# Patient Record
Sex: Female | Born: 1945
Health system: Southern US, Community
[De-identification: ages and names within clinical notes are randomized; demographics above are authoritative.]

## PROBLEM LIST (undated history)

## (undated) DIAGNOSIS — L309 Dermatitis, unspecified: Secondary | ICD-10-CM

## (undated) DIAGNOSIS — M1990 Unspecified osteoarthritis, unspecified site: Secondary | ICD-10-CM

## (undated) DIAGNOSIS — F29 Unspecified psychosis not due to a substance or known physiological condition: Secondary | ICD-10-CM

## (undated) DIAGNOSIS — E785 Hyperlipidemia, unspecified: Secondary | ICD-10-CM

## (undated) DIAGNOSIS — M349 Systemic sclerosis, unspecified: Secondary | ICD-10-CM

## (undated) DIAGNOSIS — B029 Zoster without complications: Secondary | ICD-10-CM

## (undated) DIAGNOSIS — I10 Essential (primary) hypertension: Secondary | ICD-10-CM

## (undated) HISTORY — DX: Essential (primary) hypertension: I10

## (undated) HISTORY — DX: Systemic sclerosis, unspecified: M34.9

## (undated) HISTORY — DX: Unspecified psychosis not due to a substance or known physiological condition: F29

## (undated) HISTORY — DX: Dermatitis, unspecified: L30.9

## (undated) HISTORY — DX: Hyperlipidemia, unspecified: E78.5

---

## 1992-08-20 HISTORY — PX: TOTAL ABDOMINAL HYSTERECTOMY W/ BILATERAL SALPINGOOPHORECTOMY: SHX83

## 2000-06-26 HISTORY — PX: COLONOSCOPY: SHX174

## 2001-07-15 ENCOUNTER — Other Ambulatory Visit: Admission: RE | Admit: 2001-07-15 | Discharge: 2001-07-15 | Payer: Self-pay | Admitting: Family Medicine

## 2002-06-26 ENCOUNTER — Encounter: Payer: Self-pay | Admitting: Family Medicine

## 2002-06-26 ENCOUNTER — Ambulatory Visit (HOSPITAL_COMMUNITY): Admission: RE | Admit: 2002-06-26 | Discharge: 2002-06-26 | Payer: Self-pay | Admitting: Family Medicine

## 2002-11-27 ENCOUNTER — Emergency Department (HOSPITAL_COMMUNITY): Admission: EM | Admit: 2002-11-27 | Discharge: 2002-11-27 | Payer: Self-pay | Admitting: Emergency Medicine

## 2003-01-26 ENCOUNTER — Encounter: Payer: Self-pay | Admitting: Family Medicine

## 2003-01-26 ENCOUNTER — Ambulatory Visit (HOSPITAL_COMMUNITY): Admission: RE | Admit: 2003-01-26 | Discharge: 2003-01-26 | Payer: Self-pay | Admitting: Family Medicine

## 2004-05-09 ENCOUNTER — Ambulatory Visit (HOSPITAL_COMMUNITY): Admission: RE | Admit: 2004-05-09 | Discharge: 2004-05-09 | Payer: Self-pay | Admitting: Family Medicine

## 2004-07-25 ENCOUNTER — Ambulatory Visit: Payer: Self-pay | Admitting: Family Medicine

## 2004-08-02 ENCOUNTER — Ambulatory Visit: Payer: Self-pay | Admitting: Family Medicine

## 2004-08-22 ENCOUNTER — Ambulatory Visit: Payer: Self-pay | Admitting: Family Medicine

## 2005-01-09 ENCOUNTER — Ambulatory Visit: Payer: Self-pay | Admitting: Family Medicine

## 2005-05-08 ENCOUNTER — Ambulatory Visit: Payer: Self-pay | Admitting: Family Medicine

## 2005-07-10 ENCOUNTER — Ambulatory Visit: Payer: Self-pay | Admitting: Family Medicine

## 2005-07-24 ENCOUNTER — Ambulatory Visit (HOSPITAL_COMMUNITY): Admission: RE | Admit: 2005-07-24 | Discharge: 2005-07-24 | Payer: Self-pay | Admitting: Family Medicine

## 2005-09-11 ENCOUNTER — Ambulatory Visit: Payer: Self-pay | Admitting: Family Medicine

## 2005-09-25 ENCOUNTER — Ambulatory Visit (HOSPITAL_COMMUNITY): Admission: RE | Admit: 2005-09-25 | Discharge: 2005-09-25 | Payer: Self-pay | Admitting: Family Medicine

## 2005-11-06 ENCOUNTER — Ambulatory Visit: Payer: Self-pay | Admitting: Family Medicine

## 2006-01-22 ENCOUNTER — Ambulatory Visit: Payer: Self-pay | Admitting: Family Medicine

## 2006-01-25 ENCOUNTER — Ambulatory Visit (HOSPITAL_COMMUNITY): Admission: RE | Admit: 2006-01-25 | Discharge: 2006-01-25 | Payer: Self-pay | Admitting: Family Medicine

## 2006-02-12 ENCOUNTER — Encounter: Payer: Self-pay | Admitting: Family Medicine

## 2006-02-12 ENCOUNTER — Ambulatory Visit: Payer: Self-pay | Admitting: Family Medicine

## 2006-02-12 ENCOUNTER — Other Ambulatory Visit: Admission: RE | Admit: 2006-02-12 | Discharge: 2006-02-12 | Payer: Self-pay | Admitting: Family Medicine

## 2006-03-15 ENCOUNTER — Ambulatory Visit: Payer: Self-pay | Admitting: Family Medicine

## 2006-06-04 ENCOUNTER — Ambulatory Visit: Payer: Self-pay | Admitting: Family Medicine

## 2006-09-03 ENCOUNTER — Ambulatory Visit: Payer: Self-pay | Admitting: Family Medicine

## 2006-09-04 ENCOUNTER — Encounter: Payer: Self-pay | Admitting: Family Medicine

## 2006-09-04 LAB — CONVERTED CEMR LAB
Bilirubin Urine: NEGATIVE
Hemoglobin, Urine: NEGATIVE
Ketones, ur: NEGATIVE mg/dL
Nitrite: NEGATIVE
Protein, ur: NEGATIVE mg/dL
Specific Gravity, Urine: 1.01 (ref 1.005–1.03)
Urobilinogen, UA: 0.2 (ref 0.0–1.0)

## 2006-09-24 ENCOUNTER — Encounter: Payer: Self-pay | Admitting: Family Medicine

## 2006-09-24 LAB — CONVERTED CEMR LAB
Albumin: 4.3 g/dL (ref 3.5–5.2)
Alkaline Phosphatase: 60 units/L (ref 39–117)
Basophils Absolute: 0 10*3/uL (ref 0.0–0.1)
Eosinophils Absolute: 0 10*3/uL (ref 0.0–0.7)
HCT: 39 % (ref 36.0–46.0)
HDL: 54 mg/dL (ref >39)
Lymphocytes Relative: 27 % (ref 12–46)
MCHC: 32.8 g/dL (ref 30.0–36.0)
MCV: 90.1 fL (ref 78.0–100.0)
Monocytes Absolute: 0.8 10*3/uL — ABNORMAL HIGH (ref 0.2–0.7)
Monocytes Relative: 11 % (ref 3–11)
Neutrophils Relative %: 63 % (ref 43–77)
Total CHOL/HDL Ratio: 4.1
Total Protein: 8.5 g/dL — ABNORMAL HIGH (ref 6.0–8.3)
Triglycerides: 109 mg/dL (ref <150)
VLDL: 22 mg/dL (ref 0–40)
WBC: 7.5 10*3/uL (ref 4.0–10.5)

## 2006-10-29 ENCOUNTER — Ambulatory Visit: Payer: Self-pay | Admitting: Family Medicine

## 2006-12-24 ENCOUNTER — Ambulatory Visit: Payer: Self-pay | Admitting: Family Medicine

## 2006-12-24 LAB — CONVERTED CEMR LAB
Albumin: 4.6 g/dL (ref 3.5–5.2)
Alkaline Phosphatase: 58 units/L (ref 39–117)
BUN: 14 mg/dL (ref 6–23)
Bilirubin, Direct: 0.1 mg/dL (ref 0.0–0.3)
Chloride: 104 meq/L (ref 96–112)
HDL: 46 mg/dL (ref >39)
Indirect Bilirubin: 0.3 mg/dL (ref 0.0–0.9)
Potassium: 3.9 meq/L (ref 3.5–5.3)
Sodium: 141 meq/L (ref 135–145)
Total CHOL/HDL Ratio: 3.2
Total Protein: 8 g/dL (ref 6.0–8.3)
Triglycerides: 135 mg/dL (ref <150)
VLDL: 27 mg/dL (ref 0–40)

## 2007-01-28 ENCOUNTER — Ambulatory Visit (HOSPITAL_COMMUNITY): Admission: RE | Admit: 2007-01-28 | Discharge: 2007-01-28 | Payer: Self-pay | Admitting: Family Medicine

## 2007-02-25 ENCOUNTER — Ambulatory Visit: Payer: Self-pay | Admitting: Family Medicine

## 2007-03-04 ENCOUNTER — Ambulatory Visit (HOSPITAL_COMMUNITY): Admission: RE | Admit: 2007-03-04 | Discharge: 2007-03-04 | Payer: Self-pay | Admitting: Family Medicine

## 2007-06-24 ENCOUNTER — Ambulatory Visit: Payer: Self-pay | Admitting: Family Medicine

## 2007-08-21 ENCOUNTER — Encounter: Payer: Self-pay | Admitting: Family Medicine

## 2007-08-26 ENCOUNTER — Ambulatory Visit: Payer: Self-pay | Admitting: Family Medicine

## 2007-08-26 LAB — CONVERTED CEMR LAB
ALT: 25 units/L (ref 0–35)
AST: 36 units/L (ref 0–37)
Alkaline Phosphatase: 46 units/L (ref 39–117)
Bilirubin, Direct: 0.1 mg/dL (ref 0.0–0.3)
Cholesterol: 160 mg/dL (ref 0–200)
HDL: 55 mg/dL (ref >39)
Indirect Bilirubin: 0.2 mg/dL (ref 0.0–0.9)
Total Bilirubin: 0.3 mg/dL (ref 0.3–1.2)
Total Protein: 7.8 g/dL (ref 6.0–8.3)
VLDL: 11 mg/dL (ref 0–40)

## 2007-12-26 DIAGNOSIS — E785 Hyperlipidemia, unspecified: Secondary | ICD-10-CM

## 2007-12-26 DIAGNOSIS — R1319 Other dysphagia: Secondary | ICD-10-CM

## 2007-12-26 DIAGNOSIS — I10 Essential (primary) hypertension: Secondary | ICD-10-CM

## 2007-12-26 DIAGNOSIS — F29 Unspecified psychosis not due to a substance or known physiological condition: Secondary | ICD-10-CM

## 2007-12-26 DIAGNOSIS — L309 Dermatitis, unspecified: Secondary | ICD-10-CM

## 2008-01-01 ENCOUNTER — Ambulatory Visit: Payer: Self-pay | Admitting: Family Medicine

## 2008-02-17 ENCOUNTER — Ambulatory Visit (HOSPITAL_COMMUNITY): Admission: RE | Admit: 2008-02-17 | Discharge: 2008-02-17 | Payer: Self-pay | Admitting: Family Medicine

## 2008-03-16 ENCOUNTER — Telehealth: Payer: Self-pay | Admitting: Family Medicine

## 2008-03-18 ENCOUNTER — Encounter: Payer: Self-pay | Admitting: Family Medicine

## 2008-03-18 ENCOUNTER — Ambulatory Visit: Payer: Self-pay | Admitting: Family Medicine

## 2008-03-18 ENCOUNTER — Telehealth: Payer: Self-pay | Admitting: Family Medicine

## 2008-03-18 DIAGNOSIS — N3000 Acute cystitis without hematuria: Secondary | ICD-10-CM

## 2008-03-18 LAB — CONVERTED CEMR LAB
Bilirubin Urine: NEGATIVE
Glucose, Urine, Semiquant: NEGATIVE
Nitrite: POSITIVE
Urobilinogen, UA: 0.2
pH: 6.5

## 2008-03-25 ENCOUNTER — Encounter: Payer: Self-pay | Admitting: Family Medicine

## 2008-04-01 ENCOUNTER — Encounter: Payer: Self-pay | Admitting: Family Medicine

## 2008-04-02 ENCOUNTER — Encounter: Payer: Self-pay | Admitting: Family Medicine

## 2008-05-10 ENCOUNTER — Encounter: Payer: Self-pay | Admitting: Family Medicine

## 2008-05-24 ENCOUNTER — Encounter: Payer: Self-pay | Admitting: Family Medicine

## 2008-05-27 ENCOUNTER — Encounter: Payer: Self-pay | Admitting: Family Medicine

## 2008-05-28 ENCOUNTER — Encounter: Payer: Self-pay | Admitting: Family Medicine

## 2008-05-28 LAB — CONVERTED CEMR LAB
ALT: 24 units/L (ref 0–35)
Albumin: 4.5 g/dL (ref 3.5–5.2)
Alkaline Phosphatase: 50 units/L (ref 39–117)
Basophils Absolute: 0 10*3/uL (ref 0.0–0.1)
Calcium: 9.8 mg/dL (ref 8.4–10.5)
Eosinophils Absolute: 0 10*3/uL (ref 0.0–0.7)
Eosinophils Relative: 0 % (ref 0–5)
HDL: 45 mg/dL (ref >39)
Hemoglobin: 12.4 g/dL (ref 12.0–15.0)
MCHC: 33.6 g/dL (ref 30.0–36.0)
Monocytes Absolute: 0.5 10*3/uL (ref 0.1–1.0)
Monocytes Relative: 16 % — ABNORMAL HIGH (ref 3–12)
Neutrophils Relative %: 39 % — ABNORMAL LOW (ref 43–77)
RBC: 4.14 M/uL (ref 3.87–5.11)

## 2008-05-31 ENCOUNTER — Telehealth: Payer: Self-pay | Admitting: Family Medicine

## 2008-06-03 ENCOUNTER — Ambulatory Visit: Payer: Self-pay | Admitting: Family Medicine

## 2008-06-03 DIAGNOSIS — M069 Rheumatoid arthritis, unspecified: Secondary | ICD-10-CM

## 2008-06-04 ENCOUNTER — Encounter: Payer: Self-pay | Admitting: Family Medicine

## 2008-08-05 ENCOUNTER — Ambulatory Visit: Payer: Self-pay | Admitting: Family Medicine

## 2009-01-07 ENCOUNTER — Ambulatory Visit: Payer: Self-pay | Admitting: Family Medicine

## 2009-01-07 DIAGNOSIS — M79609 Pain in unspecified limb: Secondary | ICD-10-CM | POA: Insufficient documentation

## 2009-01-07 LAB — CONVERTED CEMR LAB
Blood in Urine, dipstick: NEGATIVE
Ketones, urine, test strip: NEGATIVE
Nitrite: NEGATIVE
Protein, U semiquant: 30
Urobilinogen, UA: 0.2
WBC Urine, dipstick: NEGATIVE

## 2009-01-10 ENCOUNTER — Encounter: Payer: Self-pay | Admitting: Family Medicine

## 2009-01-10 LAB — CONVERTED CEMR LAB
ALT: 49 units/L — ABNORMAL HIGH (ref 0–35)
Albumin: 4.5 g/dL (ref 3.5–5.2)
BUN: 12 mg/dL (ref 6–23)
Bilirubin, Direct: 0.1 mg/dL (ref 0.0–0.3)
CO2: 26 meq/L (ref 19–32)
Calcium: 9.6 mg/dL (ref 8.4–10.5)
Chloride: 102 meq/L (ref 96–112)
Creatinine, Ser: 1.05 mg/dL (ref 0.40–1.20)
Glucose, Bld: 86 mg/dL (ref 70–99)
HCV Ab: NEGATIVE
Hep A IgM: NEGATIVE
Hepatitis B Surface Ag: NEGATIVE
Indirect Bilirubin: 0.4 mg/dL (ref 0.0–0.9)
LDL Cholesterol: 88 mg/dL (ref 0–99)
Potassium: 3.7 meq/L (ref 3.5–5.3)
Total CHOL/HDL Ratio: 3.1
Total Protein: 8.1 g/dL (ref 6.0–8.3)
Triglycerides: 116 mg/dL (ref <150)
VLDL: 23 mg/dL (ref 0–40)

## 2009-02-18 ENCOUNTER — Ambulatory Visit (HOSPITAL_COMMUNITY): Admission: RE | Admit: 2009-02-18 | Discharge: 2009-02-18 | Payer: Self-pay | Admitting: Family Medicine

## 2009-04-29 ENCOUNTER — Ambulatory Visit: Payer: Self-pay | Admitting: Family Medicine

## 2009-04-29 DIAGNOSIS — J309 Allergic rhinitis, unspecified: Secondary | ICD-10-CM | POA: Insufficient documentation

## 2009-05-02 LAB — CONVERTED CEMR LAB
Albumin: 4.7 g/dL (ref 3.5–5.2)
Alkaline Phosphatase: 44 units/L (ref 39–117)
BUN: 15 mg/dL (ref 6–23)
Eosinophils Absolute: 0 10*3/uL (ref 0.0–0.7)
Eosinophils Relative: 0 % (ref 0–5)
Glucose, Bld: 86 mg/dL (ref 70–99)
Hemoglobin: 13 g/dL (ref 12.0–15.0)
Indirect Bilirubin: 0.5 mg/dL (ref 0.0–0.9)
LDL Cholesterol: 139 mg/dL — ABNORMAL HIGH (ref 0–99)
Lymphs Abs: 1.2 10*3/uL (ref 0.7–4.0)
MCV: 89.3 fL (ref 78.0–100.0)
Monocytes Absolute: 0.4 10*3/uL (ref 0.1–1.0)
Neutro Abs: 0.9 10*3/uL — ABNORMAL LOW (ref 1.7–7.7)
Platelets: 189 10*3/uL (ref 150–400)

## 2009-05-19 ENCOUNTER — Encounter: Payer: Self-pay | Admitting: Family Medicine

## 2009-06-29 ENCOUNTER — Ambulatory Visit: Payer: Self-pay | Admitting: Family Medicine

## 2009-06-29 LAB — CONVERTED CEMR LAB
Glucose, Urine, Semiquant: NEGATIVE
Nitrite: NEGATIVE
Specific Gravity, Urine: 1.015
Urobilinogen, UA: 0.2
pH: 7

## 2009-07-04 LAB — CONVERTED CEMR LAB
Albumin: 4.5 g/dL (ref 3.5–5.2)
Alkaline Phosphatase: 44 units/L (ref 39–117)
Bilirubin, Direct: 0.1 mg/dL (ref 0.0–0.3)
Indirect Bilirubin: 0.3 mg/dL (ref 0.0–0.9)
Total Bilirubin: 0.4 mg/dL (ref 0.3–1.2)

## 2009-07-06 DIAGNOSIS — K0501 Acute gingivitis, non-plaque induced: Secondary | ICD-10-CM

## 2009-07-28 ENCOUNTER — Encounter: Payer: Self-pay | Admitting: Family Medicine

## 2009-11-02 ENCOUNTER — Encounter: Payer: Self-pay | Admitting: Physician Assistant

## 2009-11-02 ENCOUNTER — Ambulatory Visit: Payer: Self-pay | Admitting: Family Medicine

## 2009-11-02 DIAGNOSIS — M25519 Pain in unspecified shoulder: Secondary | ICD-10-CM

## 2009-11-02 DIAGNOSIS — R5381 Other malaise: Secondary | ICD-10-CM

## 2009-11-02 DIAGNOSIS — R21 Rash and other nonspecific skin eruption: Secondary | ICD-10-CM | POA: Insufficient documentation

## 2009-11-02 DIAGNOSIS — R5383 Other fatigue: Secondary | ICD-10-CM

## 2009-11-02 LAB — CONVERTED CEMR LAB: OCCULT 1: NEGATIVE

## 2009-11-03 ENCOUNTER — Telehealth: Payer: Self-pay | Admitting: Family Medicine

## 2009-11-07 LAB — CONVERTED CEMR LAB
ALT: 27 units/L (ref 0–35)
AST: 34 units/L (ref 0–37)
CO2: 26 meq/L (ref 19–32)
Cholesterol: 226 mg/dL — ABNORMAL HIGH (ref 0–200)
Creatinine, Ser: 0.94 mg/dL (ref 0.40–1.20)
LDL Cholesterol: 154 mg/dL — ABNORMAL HIGH (ref 0–99)
Lymphocytes Relative: 44 % (ref 12–46)
Lymphs Abs: 1 10*3/uL (ref 0.7–4.0)
Monocytes Absolute: 0.2 10*3/uL (ref 0.1–1.0)
Neutrophils Relative %: 45 % (ref 43–77)
Platelets: 186 10*3/uL (ref 150–400)
RDW: 13 % (ref 11.5–15.5)
Sodium: 138 meq/L (ref 135–145)
TSH: 2.366 microintl units/mL (ref 0.350–4.500)
Total Bilirubin: 0.5 mg/dL (ref 0.3–1.2)
VLDL: 21 mg/dL (ref 0–40)

## 2009-11-18 DIAGNOSIS — H109 Unspecified conjunctivitis: Secondary | ICD-10-CM | POA: Insufficient documentation

## 2009-11-21 ENCOUNTER — Encounter: Payer: Self-pay | Admitting: Family Medicine

## 2010-03-06 ENCOUNTER — Ambulatory Visit: Payer: Self-pay | Admitting: Family Medicine

## 2010-03-10 LAB — CONVERTED CEMR LAB
AST: 28 units/L (ref 0–37)
Alkaline Phosphatase: 45 units/L (ref 39–117)
Bilirubin, Direct: 0.1 mg/dL (ref 0.0–0.3)
CO2: 23 meq/L (ref 19–32)
Chloride: 103 meq/L (ref 96–112)
LDL Cholesterol: 172 mg/dL — ABNORMAL HIGH (ref 0–99)
Total Bilirubin: 0.6 mg/dL (ref 0.3–1.2)
Total CHOL/HDL Ratio: 4.5
Triglycerides: 86 mg/dL (ref <150)
VLDL: 17 mg/dL (ref 0–40)

## 2010-03-20 ENCOUNTER — Ambulatory Visit (HOSPITAL_COMMUNITY): Admission: RE | Admit: 2010-03-20 | Discharge: 2010-03-20 | Payer: Self-pay | Admitting: Family Medicine

## 2010-07-10 ENCOUNTER — Ambulatory Visit: Payer: Self-pay | Admitting: Family Medicine

## 2010-07-10 LAB — CONVERTED CEMR LAB
ALT: 33 units/L (ref 0–35)
Albumin: 4.5 g/dL (ref 3.5–5.2)
BUN: 15 mg/dL (ref 6–23)
Basophils Absolute: 0 10*3/uL (ref 0.0–0.1)
Cholesterol: 167 mg/dL (ref 0–200)
Creatinine, Ser: 1.06 mg/dL (ref 0.40–1.20)
Eosinophils Absolute: 0 10*3/uL (ref 0.0–0.7)
Eosinophils Relative: 0 % (ref 0–5)
Glucose, Urine, Semiquant: NEGATIVE
HCT: 39.4 % (ref 36.0–46.0)
HDL: 55 mg/dL (ref >39)
Hemoglobin: 13.1 g/dL (ref 12.0–15.0)
Ketones, urine, test strip: NEGATIVE
Lymphocytes Relative: 54 % — ABNORMAL HIGH (ref 12–46)
Lymphs Abs: 1.6 10*3/uL (ref 0.7–4.0)
MCHC: 33.2 g/dL (ref 30.0–36.0)
MCV: 91 fL (ref 78.0–100.0)
Monocytes Relative: 9 % (ref 3–12)
Neutro Abs: 1.1 10*3/uL — ABNORMAL LOW (ref 1.7–7.7)
Nitrite: NEGATIVE
Platelets: 185 10*3/uL (ref 150–400)
Sodium: 141 meq/L (ref 135–145)
Specific Gravity, Urine: 1.025
Triglycerides: 82 mg/dL (ref <150)
Urobilinogen, UA: 0.2
VLDL: 16 mg/dL (ref 0–40)
WBC: 3 10*3/uL — ABNORMAL LOW (ref 4.0–10.5)

## 2010-09-10 ENCOUNTER — Encounter: Payer: Self-pay | Admitting: Family Medicine

## 2010-09-19 NOTE — Progress Notes (Signed)
Summary: reaction  Phone Note Call from Patient   Summary of Call: the drug atrex is causing a little reaction call back at [phone] Initial call taken by: Lind Guest,  November 03, 2009 1:16 PM  Follow-up for Phone Call        states she had rash on her upper thigh after taking hydroxizine should she discontinue? states it did stop itching just caused a little rash. Follow-up by: Adella Hare LPN,  November 03, 2009 1:27 PM  Additional Follow-up for Phone Call Additional follow up Details #1::        Usually a rash from a medication will be all over & not just in one area.  She can make an appt to have the rash looked at.  In the meantime OK to continue taking the hydroxizine as long as the rash doesn't spread. Additional Follow-up by: Esperanza Sheets PA,  November 03, 2009 5:24 PM    Additional Follow-up for Phone Call Additional follow up Details #2::    patient aware Follow-up by: Adella Hare LPN,  November 04, 2009 10:18 AM

## 2010-09-19 NOTE — Letter (Signed)
Summary: OFFICE NOTES  OFFICE NOTES   Imported By: Lind Guest 02/15/2010 13:30:21  _____________________________________________________________________  External Attachment:    Type:   Image     Comment:   External Document

## 2010-09-19 NOTE — Letter (Signed)
Summary: MISC  MISC   Imported By: Lind Guest 02/15/2010 13:04:04  _____________________________________________________________________  External Attachment:    Type:   Image     Comment:   External Document

## 2010-09-19 NOTE — Letter (Signed)
Summary: PHONE NOTES  PHONE NOTES   Imported By: Lind Guest 02/15/2010 13:31:02  _____________________________________________________________________  External Attachment:    Type:   Image     Comment:   External Document

## 2010-09-19 NOTE — Progress Notes (Signed)
Summary: dermatology  dermatology   Imported By: Lind Guest 01/03/2010 14:01:16  _____________________________________________________________________  External Attachment:    Type:   Image     Comment:   External Document

## 2010-09-19 NOTE — Letter (Signed)
Summary: LABS  LABS   Imported By: Lind Guest 02/15/2010 12:56:33  _____________________________________________________________________  External Attachment:    Type:   Image     Comment:   External Document

## 2010-09-19 NOTE — Assessment & Plan Note (Signed)
Summary: physical   Vital Signs:  Patient profile:   65 year old female Menstrual status:  hysterectomy Height:      64 inches Weight:      145 pounds BMI:     24.98 O2 Sat:      97 % Pulse rate:   87 / minute Pulse rhythm:   regular Resp:     16 per minute BP sitting:   120 / 80  (left arm) Cuff size:   regular  Vitals Entered By: Everitt Amber LPN (November 02, 2009 9:42 AM)  Vision Screening:Left eye w/o correction: 20 / 70 Right Eye w/o correction: 20 / 200 Both eyes w/o correction:  20/ 70  Color vision testing: normal      Vision Entered By: Everitt Amber LPN (November 02, 2009 9:43 AM)   History of Present Illness: Reports  that she has not been doing welll. Denies recent fever or chills. Denies sinus pressure, nasal congestion , ear pain or sore throat.Reports bilateral red itchy eyes with drainage for the ;past week. Denies any change in vision or pain. Denies chest congestion, or cough productive of sputum. Denies chest pain, palpitations, PND, orthopnea or leg swelling. Denies abdominal pain, nausea, vomitting, diarrhea or constipation. Denies change in bowel movements or bloody stool.  ChronicDenies  joint pain, swelling, reduced mobility.and deformity, and progressive pain and reduced mobility of righthoulder Denies headaches, vertigo, seizures. Denies depression, anxiety or insomnia. Increased   rash, lesions, or itch.      Current Medications (verified): 1)  Diltiazem Hcl Cr 240 Mg  Cp24 (Diltiazem Hcl) .... Take 1 Tablet By Mouth Once A Day 2)  Nortriptyline Hcl 50 Mg  Caps (Nortriptyline Hcl) .... Take Two Tablets By Mouth At Bedtime 3)  Klor-Con M20 20 Meq Cr-Tabs (Potassium Chloride Crys Cr) .... Take 1 Tablet By Mouth Two Times A Day 4)  Hydrochlorothiazide 12.5 Mg Caps (Hydrochlorothiazide) .... Take 1 Capsule By Mouth Once A Day 5)  Flonase 50 Mcg/act Susp (Fluticasone Propionate) .... One To Two Puffs Per Nostril Daily As Needed 6)  Doxycycline Hyclate  100 Mg Caps (Doxycycline Hyclate) .... Take 1 Capsule By Mouth Two Times A Day  Allergies (verified): 1)  ! Penicillin 2)  ! Cipro  Review of Systems      See HPI Eyes:  Complains of blurring, discharge, red eye, and vision loss-both eyes; left red eye x 1 week and poor vision unable to afford new glasses ,lost the other ones. MS:  Complains of joint pain and stiffness;  right shoulder pain intermittently x 6months worse with generalised itching. Derm:  Complains of dryness, itching, and rash; generalised puritic rash x 2 weeks, ears, hands , trunk legs, red glat. Allergy:  Complains of seasonal allergies.  Physical Exam  General:  Well-developed,well-nourished,in no acute distress; alert,appropriate and cooperative throughout examination Head:  Normocephalic and atraumatic without obvious abnormalities. No apparent alopecia or balding. Eyes:  bilateral conjunctival injection with yellow drainage Ears:  External ear exam shows no significant lesions or deformities.  Otoscopic examination reveals clear canals, tympanic membranes are intact bilaterally without bulging, retraction, inflammation or discharge. Hearing is grossly normal bilaterally. Nose:  no external deformity and no nasal discharge.   Mouth:  pharynx pink and moist and teeth missing.   Neck:  No deformities, masses, or tenderness noted. Chest Wall:  No deformities, masses, or tenderness noted. Breasts:  No mass, nodules, thickening, tenderness, bulging, retraction, inflamation, nipple discharge or skin changes noted.  Lungs:  Normal respiratory effort, chest expands symmetrically. Lungs are clear to auscultation, no crackles or wheezes. Heart:  Normal rate and regular rhythm. S1 and S2 normal without gallop, murmur, click, rub or other extra sounds. Abdomen:  Bowel sounds positive,abdomen soft and non-tender without masses, organomegaly or hernias noted. Rectal:  No external abnormalities noted. Normal sphincter tone. No  rectal masses or tenderness. Genitalia:  deferred per pt request Msk:  No deformity or scoliosis noted of thoracic or lumbar spine.   Pulses:  R and L carotid,radial,femoral,dorsalis pedis and posterior tibial pulses are full and equal bilaterally Extremities:  No clubbing, cyanosis, edema, or deformity noted with nreduced ROM right shoulder, also rheumatoid deformity of the fingers Neurologic:  No cranial nerve deficits noted. Station and gait are normal. Plantar reflexes are down-going bilaterally. DTRs are symmetrical throughout. Sensory, motor and coordinative functions appear intact. Skin:  severe exzemz. skin tight , hyperpigmentation with scratch marks very dry skin Cervical Nodes:  No lymphadenopathy noted Axillary Nodes:  No palpable lymphadenopathy Inguinal Nodes:  No significant adenopathy Psych:  Cognition and judgment appear intact. Alert and cooperative with normal attention span and concentration. No apparent delusions, illusions, hallucinations   Impression & Recommendations:  Problem # 1:  HYPERTENSION (ICD-401.9) Assessment Unchanged  Her updated medication list for this problem includes:    Diltiazem Hcl Cr 240 Mg Cp24 (Diltiazem hcl) .Marland Kitchen... Take 1 tablet by mouth once a day    Hydrochlorothiazide 12.5 Mg Caps (Hydrochlorothiazide) .Marland Kitchen... Take 1 capsule by mouth once a day  BP today: 120/80 Prior BP: 114/77 (06/29/2009)  Labs Reviewed: K+: 4.2 (06/29/2009) Creat: : 0.93 (04/29/2009)   Chol: 214 (04/29/2009)   HDL: 45 (04/29/2009)   LDL: 139 (04/29/2009)   TG: 152 (04/29/2009)  Problem # 2:  HYPERLIPIDEMIA (ICD-272.4) Assessment: Comment Only  Orders: T-Hepatic Function [id number]) T-Lipid Profile [id number])  Labs Reviewed: SGOT: 39 (06/29/2009)   SGPT: 29 (06/29/2009)   HDL:45 (04/29/2009), 53 (01/07/2009)  LDL:139 (04/29/2009), 88 (29/56/2130)  Chol:214 (04/29/2009), 164 (01/07/2009)  Trig:152 (04/29/2009), 116 (01/07/2009)  Problem # 3:  SKIN RASH  (ICD-782.1) Assessment: Deteriorated depomedrol administered and prednisone and hydroxyzine prescribed, also ptreferred to derm  Problem # 4:  ECZEMA (ICD-692.9) Assessment: Deteriorated  Future Orders: Dermatology Referral (Derma) ... 11/03/2009  Problem # 5:  CONJUNCTIVITIS (ICD-372.30) Assessment: Comment Only  Her updated medication list for this problem includes:    Genoptic 0.3 % Soln (Gentamicin sulfate) ..... One drop to left eye every4 hourswhile awake for 5 days  Problem # 6:  SHOULDER PAIN, RIGHT, CHRONIC (ICD-719.41) Assessment: Deteriorated  Future Orders: Orthopedic Referral (Ortho) ... 11/03/2009  Complete Medication List: 1)  Diltiazem Hcl Cr 240 Mg Cp24 (Diltiazem hcl) .... Take 1 tablet by mouth once a day 2)  Nortriptyline Hcl 50 Mg Caps (Nortriptyline hcl) .... Take two tablets by mouth at bedtime 3)  Klor-con M20 20 Meq Cr-tabs (Potassium chloride crys cr) .... Take 1 tablet by mouth two times a day 4)  Hydrochlorothiazide 12.5 Mg Caps (Hydrochlorothiazide) .... Take 1 capsule by mouth once a day 5)  Flonase 50 Mcg/act Susp (Fluticasone propionate) .... One to two puffs per nostril daily as needed 6)  Doxycycline Hyclate 100 Mg Caps (Doxycycline hyclate) .... Take 1 capsule by mouth two times a day 7)  Hydroxyzine Hcl 25 Mg Tabs (Hydroxyzine hcl) .... One to two tablets at bedtime as needed for itch 8)  Genoptic 0.3 % Soln (Gentamicin sulfate) .... One drop to left eye every4 hourswhile  awake for 5 days  Other Orders: T-Basic Metabolic Panel [phone]) T-CBC w/Diff [phone]) T-TSH [phone]) Admin of Therapeutic Inj  intramuscular or subcutaneous ([id number]) Hemoccult Guaiac-1 spec.(in office) (96295)  Patient Instructions: 1)  Please schedule a follow-up appointment in 3.5 months. 2)  you are getting meds sent in for the rash as well as for your left eye. pls call if the eye does not get totally better. 3)  You also have a new med to help withthe  itch. 4)  You are being referred to orthopedics and dermatology. 5)  aninjection today for the rash, and also labwork 6)    Prescriptions: GENOPTIC 0.3 % SOLN (GENTAMICIN SULFATE) one drop to left eye every4 hourswhile awake for 5 days  #26ml x 0   Entered and Authorized by:   Syliva Overman MD   Signed by:   Syliva Overman MD on 11/02/2009   Method used:   Electronically to        [facility] (retail)       [address] [place], [place]  [address]       Ph: [phone]       Fax: [phone]   RxID:   [id number] HYDROXYZINE HCL 25 MG TABS (HYDROXYZINE HCL) one to two tablets at bedtime as needed for itch  #60 x 1   Entered and Authorized by:   Syliva Overman MD   Signed by:   Syliva Overman MD on 11/02/2009   Method used:   Electronically to        [facility] (retail)       [address] [place], [place]  [address]       Ph: [phone]       Fax: [phone]   RxID:   [id number] PREDNISONE (PAK) 5 MG TABS (PREDNISONE) Use as directed  #21 x 0   Entered and Authorized by:   Syliva Overman MD   Signed by:   Syliva Overman MD on 11/02/2009   Method used:   Electronically to        [facility] (retail)       [address]       [place], [place]  [address]       Ph: [phone]       Fax: [phone]   RxID:   [id number]    Medication Administration  Injection # 1:    Medication: Depo- Medrol 80mg     Diagnosis: SKIN RASH (ICD-782.1)    Route: IM    Site: LUOQ gluteus    Exp Date: 06/2010    Lot #: [id number]     Mfr: Pharmacia    Comments: 80mg  given     Patient tolerated injection without complications    Given by: Everitt Amber LPN (November 02, 2009 10:41 AM)  Orders Added: 1)  T-Basic Metabolic Panel [phone] 2)  T-Hepatic Function [75102-58527] 3)  T-Lipid Profile [78242-35361] 4)  T-CBC w/Diff [44315-40086] 5)   T-TSH [76195-09326] 6)  Admin of Therapeutic Inj  intramuscular or subcutaneous [96372] 7)  Hemoccult Guaiac-1 spec.(in office) [82270] 8)  Dermatology Referral [Derma] 9)  Orthopedic Referral [Ortho] 10)  Est. Patient 40-64 years [99396]  Laboratory Results    Stool - Occult Blood Hemmoccult #1: negative Date: 11/02/2009 Comments: 51180 9R 8/11  118 10/12   

## 2010-09-19 NOTE — Letter (Signed)
Summary: DEMO  DEMO   Imported By: Lind Guest 02/15/2010 13:04:40  _____________________________________________________________________  External Attachment:    Type:   Image     Comment:   External Document

## 2010-09-19 NOTE — Assessment & Plan Note (Signed)
Summary: office visit   Vital Signs:  Patient profile:   65 year old female Menstrual status:  hysterectomy Height:      64 inches Weight:      151.25 pounds BMI:     26.06 O2 Sat:      97 % on Room air Pulse rate:   87 / minute Pulse rhythm:   regular Resp:     16 per minute BP sitting:   124 / 80  (left arm)  Vitals Entered By: Adella Hare LPN (July 10, 2010 10:13 AM)  Nutrition Counseling: Patient's BMI is greater than 25 and therefore counseled on weight management options.  O2 Flow:  Room air CC: follow-up visit Is Patient Diabetic? No Pain Assessment Patient in pain? no        CC:  follow-up visit.  History of Present Illness: c/o generalised joint pains, involving neck, hands and back. Reports  that she has otherwise been  doing well. Denies recent fever or chills. Denies sinus pressure, nasal congestion , ear pain or sore throat. Denies chest congestion, or cough productive of sputum. Denies chest pain, palpitations, PND, orthopnea or leg swelling. Denies abdominal pain, nausea, vomitting, diarrhea or constipation. Denies change in bowel movements or bloody stool. Denies dysuria , frequency, incontinence or hesitancy.  Denies headaches, vertigo, seizures. Denies depression, anxiety or insomnia.Is maintained on chronic mental health meds    Current Medications (verified): 1)  Diltiazem Hcl Cr 240 Mg  Cp24 (Diltiazem Hcl) .... Take 1 Tablet By Mouth Once A Day 2)  Nortriptyline Hcl 50 Mg  Caps (Nortriptyline Hcl) .... Take Two Tablets By Mouth At Bedtime 3)  Klor-Con M20 20 Meq Cr-Tabs (Potassium Chloride Crys Cr) .... Take 1 Tablet By Mouth Two Times A Day 4)  Hydrochlorothiazide 12.5 Mg Caps (Hydrochlorothiazide) .... Take 1 Capsule By Mouth Once A Day 5)  Naprosyn 500 Mg Tabs (Naproxen) .... Take 1 Tablet By Mouth Two Times A Day 6)  Lovastatin 40 Mg Tabs (Lovastatin) .... Take 1 Tab By Mouth At Bedtime  Allergies (verified): 1)  ! Penicillin 2)   ! Cipro  Review of Systems      See HPI General:  Complains of fatigue. Eyes:  Denies blurring, discharge, eye irritation, and eye pain. MS:  Complains of joint pain and stiffness. Derm:  Complains of dryness, itching, and rash. Psych:  Complains of mental problems; denies anxiety, depression, suicidal thoughts/plans, thoughts of violence, and unusual visions or sounds. Endo:  Denies cold intolerance, excessive hunger, excessive thirst, excessive urination, and heat intolerance. Heme:  Denies abnormal bruising, bleeding, enlarge lymph nodes, and fevers. Allergy:  Complains of seasonal allergies; denies hives or rash, itching eyes, and sneezing.  Physical Exam  General:  Well-developed,well-nourished,in no acute distress; alert,appropriate and cooperative throughout examination HEENT: No facial asymmetry,  EOMI, No sinus tenderness, TM's Clear, oropharynx  pink and moist.   Chest: Clear to auscultation bilaterally.  CVS: S1, S2, No murmurs, No S3.   Abd: Soft, Nontender.  MS: Adequate ROM spine, hips, shoulders and knees.  Ext: No edema.   CNS: CN 2-12 intact, power tone and sensation normal throughout.   Skin: Intact, dryness and scaling of the palms Psych: Good eye contact, normal affect.  Memory intact, not anxious or depressed appearing.    Impression & Recommendations:  Problem # 1:  HYPERTENSION (ICD-401.9) Assessment Unchanged  Discussed medication use and symptom control.   Her updated medication list for this problem includes:    Diltiazem Hcl  Cr 240 Mg Cp24 (Diltiazem hcl) .Marland Kitchen... Take 1 tablet by mouth once a day    Hydrochlorothiazide 12.5 Mg Caps (Hydrochlorothiazide) .Marland Kitchen... Take 1 capsule by mouth once a day  BP today: 124/80 Prior BP: 124/80 (03/06/2010)  Labs Reviewed: K+: 3.3 (03/06/2010) Creat: : 0.96 (03/06/2010)   Chol: 243 (03/06/2010)   HDL: 54 (03/06/2010)   LDL: 172 (03/06/2010)   TG: 86 (03/06/2010)  Orders: Medicare Electronic Prescription  [phone]) T-Basic Metabolic Panel ([id number])  Problem # 2:  ARTHRITIS, RHEUMATOID (ICD-714.0) Assessment: Unchanged  Problem # 3:  HYPERLIPIDEMIA (ICD-272.4) Assessment: Comment Only  Her updated medication list for this problem includes:    Lovastatin 40 Mg Tabs (Lovastatin) .Marland Kitchen... Take 1 tab by mouth at bedtime  Orders: T-Lipid Profile [phone]) T-Hepatic Function [phone])  Labs Reviewed: SGOT: 28 (03/06/2010)   SGPT: 19 (03/06/2010)   HDL:54 (03/06/2010), 51 (11/02/2009)  LDL:172 (03/06/2010), 154 (11/02/2009)  Chol:243 (03/06/2010), 226 (11/02/2009)  Trig:86 (03/06/2010), 103 (11/02/2009)  Problem # 4:  ECZEMA (ICD-692.9) Assessment: Improved  Problem # 5:  ACUTE CYSTITIS (ICD-595.0) Assessment: Comment Only  Orders: Urinalysis ([id number])  Encouraged to push clear liquids, get enough rest, and take acetaminophen as needed. To be seen in 10 days if no improvement, sooner if worse.  Complete Medication List: 1)  Diltiazem Hcl Cr 240 Mg Cp24 (Diltiazem hcl) .... Take 1 tablet by mouth once a day 2)  Nortriptyline Hcl 50 Mg Caps (Nortriptyline hcl) .... Take two tablets by mouth at bedtime 3)  Klor-con M20 20 Meq Cr-tabs (Potassium chloride crys cr) .... Take 1 tablet by mouth two times a day 4)  Hydrochlorothiazide 12.5 Mg Caps (Hydrochlorothiazide) .... Take 1 capsule by mouth once a day 5)  Naprosyn 500 Mg Tabs (Naproxen) .... Take 1 tablet by mouth two times a day 6)  Lovastatin 40 Mg Tabs (Lovastatin) .... Take 1 tab by mouth at bedtime  Other Orders: T-CBC w/Diff ([id number]) Influenza Vaccine NON MCR (95638)   Patient Instructions: 1)  Please schedule a follow-up appointment in 4 months. 2)  BMP prior to visit, ICD-9: 3)  Hepatic Panel prior to visit, ICD-9:  fasting today. 4)  Lipid Panel prior to visit, ICD-9: 5)  CBC w/ Diff prior to visit, ICD-9: 6)  Injections today for joint pain.Flu vac today 7)  CCUA today , will send in antibiotic if  infected. Prescriptions: NORTRIPTYLINE HCL 50 MG  CAPS (NORTRIPTYLINE HCL) Take two tablets by mouth at bedtime  #60 Each x 3   Entered by:   Adella Hare LPN   Authorized by:   Syliva Overman MD   Signed by:   Adella Hare LPN on 75/64/3329   Method used:   Electronically to        [facility] (retail)       [address]       [place], [place]  [address]       Ph: [phone]       Fax: [phone]   RxID:   [id number] DILTIAZEM HCL CR 240 MG  CP24 (DILTIAZEM HCL) Take 1 tablet by mouth once a day  #30 Each x 3   Entered by:   Adella Hare LPN   Authorized by:   Syliva Overman MD   Signed by:   Adella Hare LPN on 31/51/7616   Method used:   Electronically to        [facility] (retail)  [address]       [place], [place]  [address]       Ph: [phone]       Fax: [phone]   RxID:   [id number]    Medication Administration  Injection # 1:    Medication: Depo- Medrol 80mg     Route: IM    Site: RUOQ gluteus    Exp Date: 07/12    Lot #: Gunnar [id number]    Mfr: Pharmacia    Patient tolerated injection without complications    Given by: Adella Hare LPN (July 10, 2010 12:01 PM)  Injection # 2:    Medication: Ketorolac-Toradol 15mg     Route: IM    Site: LUOQ gluteus    Exp Date: 06/21/2011    Lot #: [id number]    Mfr: novaplus    Comments: toradol 60mg  given    Patient tolerated injection without complications    Given by: Adella Hare LPN (July 10, 2010 12:01 PM)  Orders Added: 1)  Est. Patient Level IV [99214] 2)  Medicare Electronic Prescription [G8553] 3)  T-CBC w/Diff [[id number]] 4)  T-Basic Metabolic Panel [80048-22910] 5)  T-Lipid Profile [80061-22930] 6)  T-Hepatic Function [80076-22960] 7)  Urinalysis [81003-65000] 8)  Influenza Vaccine NON MCR [00028]   Immunizations Administered:  Influenza Vaccine # 1:    Vaccine Type: Fluvax Non-MCR    Site: right  deltoid    Mfr: novartis    Dose: 0.5 ml    Route: IM    Given by: Adella Hare LPN    Exp. Date: 12/2010    Lot #: [id number]    VIS given: 03/14/10 version given July 10, 2010.   Immunizations Administered:  Influenza Vaccine # 1:    Vaccine Type: Fluvax Non-MCR    Site: right deltoid    Mfr: novartis    Dose: 0.5 ml    Route: IM    Given by: Adella Hare LPN    Exp. Date: 12/2010    Lot #: [id number]    VIS given: 03/14/10 version given July 10, 2010.   Medication Administration  Injection # 1:    Medication: Depo- Medrol 80mg     Route: IM    Site: RUOQ gluteus    Exp Date: 07/12    Lot #: Gunnar [id number]    Mfr: Pharmacia    Patient tolerated injection without complications    Given by: Adella Hare LPN (July 10, 2010 12:01 PM)  Injection # 2:    Medication: Ketorolac-Toradol 15mg     Route: IM    Site: LUOQ gluteus    Exp Date: 06/21/2011    Lot #: [id number]    Mfr: novaplus    Comments: toradol 60mg  given    Patient tolerated injection without complications    Given by: Adella Hare LPN (July 10, 2010 12:01 PM)  Orders Added: 1)  Est. Patient Level IV [99214] 2)  Medicare Electronic Prescription [G8553] 3)  T-CBC w/Diff [[id number]] 4)  T-Basic Metabolic Panel [80048-22910] 5)  T-Lipid Profile [80061-22930] 6)  T-Hepatic Function [80076-22960] 7)  Urinalysis [81003-65000] 8)  Influenza Vaccine NON MCR [00028]   Laboratory Results   Urine Tests  Date/Time Received: July 10, 2010 12:02 PM  Date/Time Reported: July 10, 2010 12:02 PM   Routine Urinalysis   Color: yellow Appearance: Clear Glucose: negative   (Normal Range: Negative) Bilirubin: negative   (Normal Range: Negative) Ketone: negative   (Normal Range: Negative) Spec. Gravity:  1.025   (Normal Range: 1.003-1.035) Blood: negative   (Normal Range: Negative) pH: 7.0   (Normal Range: 5.0-8.0) Protein: 30   (Normal Range: Negative) Urobilinogen: 0.2   (Normal Range: 0-1) Nitrite:  negative   (Normal Range: Negative) Leukocyte Esterace: negative   (Normal Range: Negative)

## 2010-09-19 NOTE — Assessment & Plan Note (Signed)
Summary: f up   Vital Signs:  Patient profile:   65 year old female Menstrual status:  hysterectomy Height:      64 inches Weight:      145 pounds BMI:     24.98 O2 Sat:      94 % Pulse rate:   85 / minute Pulse rhythm:   regular Resp:     16 per minute BP sitting:   124 / 80  (left arm) Cuff size:   regular  Vitals Entered By: Everitt Amber LPN (March 06, 2010 10:27 AM) CC: Follow up chronic problems   CC:  Follow up chronic problems.  History of Present Illness: States the pain ion her right shoulder is much better since DrKeeling injected it, states he told her she has rheumatoid arthritis Reports  thatoverall she has been doing well. Denies recent fever or chills. Denies sinus pressure, nasal congestion , ear pain or sore throat. Denies chest congestion, or cough productive of sputum. Denies chest pain, palpitations, PND, orthopnea or leg swelling. Denies abdominal pain, nausea, vomitting, diarrhea or constipation. Denies change in bowel movements or bloody stool. Denies dysuria , frequency, incontinence or hesitancy.  Denies headaches, vertigo, seizures. Denies depression, anxiety or insomnia.    Current Medications (verified): 1)  Diltiazem Hcl Cr 240 Mg  Cp24 (Diltiazem Hcl) .... Take 1 Tablet By Mouth Once A Day 2)  Nortriptyline Hcl 50 Mg  Caps (Nortriptyline Hcl) .... Take Two Tablets By Mouth At Bedtime 3)  Klor-Con M20 20 Meq Cr-Tabs (Potassium Chloride Crys Cr) .... Take 1 Tablet By Mouth Two Times A Day 4)  Hydrochlorothiazide 12.5 Mg Caps (Hydrochlorothiazide) .... Take 1 Capsule By Mouth Once A Day 5)  Genoptic 0.3 % Soln (Gentamicin Sulfate) .... One Drop To Left Eye Every4 Hourswhile Awake For 5 Days 6)  Naprosyn 500 Mg Tabs (Naproxen) .... Take 1 Tablet By Mouth Two Times A Day  Allergies (verified): 1)  ! Penicillin 2)  ! Cipro  Review of Systems      See HPI General:  Complains of fatigue. Eyes:  Complains of vision loss-both eyes. MS:   Complains of joint pain and stiffness. Psych:  Complains of anxiety and mental problems; denies depression, suicidal thoughts/plans, thoughts of violence, and thoughts /plans of harming others. Endo:  Denies cold intolerance, excessive hunger, excessive thirst, excessive urination, heat intolerance, polyuria, and weight change. Heme:  Denies abnormal bruising and bleeding. Allergy:  Complains of seasonal allergies; denies hives or rash and itching eyes.  Physical Exam  General:  Well-developed,well-nourished,in no acute distress; alert,appropriate and cooperative throughout examination HEENT: No facial asymmetry,  EOMI, No sinus tenderness, TM's Clear, oropharynx  pink and moist.   Chest: Clear to auscultation bilaterally.  CVS: S1, S2, No murmurs, No S3.   Abd: Soft, Nontender.  MS: Adequate ROM spine, hips, shoulders and knees. Arthritic deformity of th fingers Ext: No edema.   CNS: CN 2-12 intact, power tone and sensation normal throughout.   Skin: Intact, marked improvement in eczema and dryness. Psych: Good eye contact, normal affect.  Memory intact, not anxious or depressed appearing.    Impression & Recommendations:  Problem # 1:  SPECIAL SCREENING FOR OSTEOPOROSIS (ICD-V82.81) Assessment Comment Only  Orders: T-Vitamin D (25-Hydroxy) ([id number])  Problem # 2:  SKIN RASH (ICD-782.1) Assessment: Improved  Problem # 3:  HYPERTENSION (ICD-401.9) Assessment: Unchanged  Her updated medication list for this problem includes:    Diltiazem Hcl Cr 240 Mg Cp24 (Diltiazem hcl) .Marland KitchenMarland KitchenMarland KitchenMarland Kitchen  Take 1 tablet by mouth once a day    Hydrochlorothiazide 12.5 Mg Caps (Hydrochlorothiazide) .Marland Kitchen... Take 1 capsule by mouth once a day  Orders: T-Basic Metabolic Panel [phone])  BP today: 124/80 Prior BP: 120/80 (11/02/2009)  Labs Reviewed: K+: 3.6 (11/02/2009) Creat: : 0.94 (11/02/2009)   Chol: 226 (11/02/2009)   HDL: 51 (11/02/2009)   LDL: 154 (11/02/2009)   TG: 103  (11/02/2009)  Problem # 4:  HYPERLIPIDEMIA (ICD-272.4) Assessment: Comment Only  Orders: T-Hepatic Function [id number]) T-Lipid Profile [id number])  Labs Reviewed: SGOT: 34 (11/02/2009)   SGPT: 27 (11/02/2009)   HDL:51 (11/02/2009), 45 (04/29/2009)  LDL:154 (11/02/2009), 139 (04/29/2009)  Chol:226 (11/02/2009), 214 (04/29/2009)  Trig:103 (11/02/2009), 152 (04/29/2009)  Her updated medication list for this problem includes:    Lovastatin 40 Mg Tabs (Lovastatin) .Marland Kitchen... Take 1 tab by mouth at bedtime  Complete Medication List: 1)  Diltiazem Hcl Cr 240 Mg Cp24 (Diltiazem hcl) .... Take 1 tablet by mouth once a day 2)  Nortriptyline Hcl 50 Mg Caps (Nortriptyline hcl) .... Take two tablets by mouth at bedtime 3)  Klor-con M20 20 Meq Cr-tabs (Potassium chloride crys cr) .... Take 1 tablet by mouth two times a day 4)  Hydrochlorothiazide 12.5 Mg Caps (Hydrochlorothiazide) .... Take 1 capsule by mouth once a day 5)  Genoptic 0.3 % Soln (Gentamicin sulfate) .... One drop to left eye every4 hourswhile awake for 5 days 6)  Naprosyn 500 Mg Tabs (Naproxen) .... Take 1 tablet by mouth two times a day 7)  Lovastatin 40 Mg Tabs (Lovastatin) .... Take 1 tab by mouth at bedtime  Other Orders: Future Orders: Radiology Referral (Radiology) ... 03/20/2010  Patient Instructions: 1)  Please schedule a follow-up appointment in 3.5 to 4 months. 2)  BMP prior to visit, ICD-9: 3)  Hepatic Panel prior to visit, ICD-9: 4)  Lipid Panel prior to visit, ICD-9: 5)  Vit D today  fasting today. 6)  No med changes at this tiime Prescriptions: LOVASTATIN 40 MG TABS (LOVASTATIN) Take 1 tab by mouth at bedtime  #30 x 3   Entered and Authorized by:   Syliva Overman MD   Signed by:   Syliva Overman MD on 03/07/2010   Method used:   Electronically to        [facility]* (retail)       [address]       [place], [place]  [address]       Ph: [phone]       Fax:  [phone]   RxID:   [id number] HYDROCHLOROTHIAZIDE 12.5 MG CAPS (HYDROCHLOROTHIAZIDE) Take 1 capsule by mouth once a day  #30 Each x 4   Entered by:   Everitt Amber LPN   Authorized by:   Syliva Overman MD   Signed by:   Everitt Amber LPN on 83/15/1761   Method used:   Electronically to        [facility] (retail)       [address]       [place], [place]  [address]       Ph: [phone]       Fax: [phone]   RxID:   [id number] GENOPTIC 0.3 % SOLN (GENTAMICIN SULFATE) one drop to left eye every4 hourswhile awake for 5 days  #58ml x 0   Entered by:   Everitt Amber LPN   Authorized by:   Claris Che  Simpson MD   Signed by:   Everitt Amber LPN on 16/05/9603   Method used:   Electronically to        [facility] (retail)       [address]       [place], [place]  [address]       Ph: [phone]       Fax: [phone]   RxID:   [id number]

## 2010-09-19 NOTE — Letter (Signed)
Summary: HISTORY AND PHYSICAL  HISTORY AND PHYSICAL   Imported By: Lind Guest 02/15/2010 13:29:43  _____________________________________________________________________  External Attachment:    Type:   Image     Comment:   External Document

## 2010-09-19 NOTE — Letter (Signed)
Summary: X RAYS  X RAYS   Imported By: Lind Guest 02/15/2010 13:31:29  _____________________________________________________________________  External Attachment:    Type:   Image     Comment:   External Document

## 2010-11-09 ENCOUNTER — Encounter: Payer: Self-pay | Admitting: Family Medicine

## 2010-11-09 ENCOUNTER — Encounter: Payer: Self-pay | Admitting: *Deleted

## 2010-11-10 ENCOUNTER — Encounter: Payer: Self-pay | Admitting: Family Medicine

## 2010-11-13 ENCOUNTER — Other Ambulatory Visit: Payer: Self-pay | Admitting: Family Medicine

## 2010-11-13 ENCOUNTER — Ambulatory Visit (INDEPENDENT_AMBULATORY_CARE_PROVIDER_SITE_OTHER): Payer: Medicaid Other | Admitting: Family Medicine

## 2010-11-13 ENCOUNTER — Encounter: Payer: Self-pay | Admitting: Family Medicine

## 2010-11-13 VITALS — BP 118/80 | HR 75 | Resp 16 | Ht 62.0 in | Wt 146.0 lb

## 2010-11-13 DIAGNOSIS — E559 Vitamin D deficiency, unspecified: Secondary | ICD-10-CM

## 2010-11-13 DIAGNOSIS — L259 Unspecified contact dermatitis, unspecified cause: Secondary | ICD-10-CM

## 2010-11-13 DIAGNOSIS — L309 Dermatitis, unspecified: Secondary | ICD-10-CM

## 2010-11-13 DIAGNOSIS — E785 Hyperlipidemia, unspecified: Secondary | ICD-10-CM

## 2010-11-13 DIAGNOSIS — M129 Arthropathy, unspecified: Secondary | ICD-10-CM

## 2010-11-13 DIAGNOSIS — R5381 Other malaise: Secondary | ICD-10-CM

## 2010-11-13 DIAGNOSIS — M1990 Unspecified osteoarthritis, unspecified site: Secondary | ICD-10-CM

## 2010-11-13 DIAGNOSIS — F29 Unspecified psychosis not due to a substance or known physiological condition: Secondary | ICD-10-CM

## 2010-11-13 DIAGNOSIS — I10 Essential (primary) hypertension: Secondary | ICD-10-CM

## 2010-11-13 DIAGNOSIS — R002 Palpitations: Secondary | ICD-10-CM

## 2010-11-13 DIAGNOSIS — L84 Corns and callosities: Secondary | ICD-10-CM

## 2010-11-13 DIAGNOSIS — R5383 Other fatigue: Secondary | ICD-10-CM

## 2010-11-13 LAB — BASIC METABOLIC PANEL
Calcium: 9.5 mg/dL (ref 8.4–10.5)
Sodium: 140 mEq/L (ref 135–145)

## 2010-11-13 LAB — HEPATIC FUNCTION PANEL
ALT: 42 U/L — ABNORMAL HIGH (ref 0–35)
AST: 47 U/L — ABNORMAL HIGH (ref 0–37)
Albumin: 4.6 g/dL (ref 3.5–5.2)
Alkaline Phosphatase: 49 U/L (ref 39–117)
Bilirubin, Direct: 0.1 mg/dL (ref 0.0–0.3)

## 2010-11-13 LAB — TSH: TSH: 2.06 u[IU]/mL (ref 0.350–4.500)

## 2010-11-13 NOTE — Patient Instructions (Signed)
F/U IN 4 MONTHS.  I will do an EKG today since you complain of palpitations.  Stop naprosyn please.No other med changes.  Fasting labs today.

## 2010-11-13 NOTE — Progress Notes (Signed)
Subjective:    Patient ID: Julia Stevens, female    DOB: November 05, 1945, 65 y.o.   MRN: [id number]  HPI Pt in with several concerns. Heart racing intermittently for approximately 8 months, when she is upsert this tends to aggravate this.She denies chest pain, PND, leg edema or orthopnea. Right chest discomfort when she swallows certain tablets, some  feel as though they are sticking, feels the problem is the naprosyn, so this will be discontinued. She has a painful corn on her 2nd toe which she wants podiatry to manage. The PT is here for follow up and re-evaluation of chronic medical conditions, medication management and review of recent lab and radiology data.  Preventive health is updated, specifically  Cancer screening, Osteoporosis screening and Immunization.   Questions or concerns regarding consultations or procedures which the PT has had in the interim are  addressed.     Review of Systems  Constitutional: Positive for fatigue. Negative for fever, chills, activity change, appetite change and unexpected weight change.  HENT: Negative for hearing loss, ear pain, congestion, sore throat, rhinorrhea, sneezing, trouble swallowing, neck pain, neck stiffness, postnasal drip and sinus pressure.   Eyes: Negative for photophobia, pain, discharge, redness, itching and visual disturbance.  Respiratory: Negative for cough, chest tightness, shortness of breath and wheezing.   Cardiovascular: Positive for palpitations. Negative for chest pain and leg swelling.  Gastrointestinal: Negative for nausea, vomiting, abdominal pain, diarrhea, constipation and blood in stool.  Genitourinary: Negative for dysuria, frequency, hematuria and flank pain.  Musculoskeletal: Negative for myalgias, back pain, joint swelling, arthralgias and gait problem.  Skin: Positive for rash. Negative for wound.       [Severe eczema Neurological: Positive for headaches ( no aura, twwice in the past 2 weeks, relieved by tylenol,  felt to be stress related). Negative for dizziness, tremors, seizures, speech difficulty, weakness and numbness.  Hematological: Negative for adenopathy. Does not bruise/bleed easily.  Psychiatric/Behavioral: Negative for suicidal ideas, hallucinations, behavioral problems, confusion, sleep disturbance and decreased concentration. The patient is nervous/anxious. The patient is not hyperactive.        [Controlled on meds      Objective:   Physical Exam  [nursing notereviewed. Constitutional: She is oriented to person, place, and time. She appears well-developed and well-nourished.  HENT:  Head: Normocephalic.  Right Ear: External ear normal.  Left Ear: External ear normal.  Mouth/Throat: No oropharyngeal exudate.  Eyes: Conjunctivae and EOM are normal. Right eye exhibits no discharge. Left eye exhibits no discharge. No scleral icterus.  Neck: Normal range of motion. Neck supple. No JVD present. No tracheal deviation present. No thyromegaly present.  Cardiovascular: Normal rate, regular rhythm, normal heart sounds and intact distal pulses.   No murmur heard. Pulmonary/Chest: Effort normal and breath sounds normal. No stridor. No respiratory distress. She has no wheezes. She has no rales. She exhibits no tenderness.  Abdominal: Soft. Bowel sounds are normal. There is no tenderness. There is no rebound and no guarding.  Musculoskeletal: Normal range of motion. She exhibits no edema.  Lymphadenopathy:    She has no cervical adenopathy.  Neurological: She is alert and oriented to person, place, and time. No cranial nerve deficit. Coordination normal.  Skin: Skin is warm and dry. Rash ( severe eczema affecting palms, controlled) noted. No erythema.       Corn on 2nd toe  Psychiatric: She has a normal mood and affect. Her behavior is normal. Judgment and thought content normal.  Assessment & Plan:  1.Hypertension:Controlled, no changes in medication.  2.hyperlipidemia, low fat  diet discussed and encouraged, meds to be continued as before. 3.Corn.Referral to podiatry. 4.Palpitations, benign by history, pt has normal exam, advised if symptoms worsen or persist to call for referral  5. Headaches exam is benign .

## 2010-11-14 ENCOUNTER — Encounter: Payer: Self-pay | Admitting: Family Medicine

## 2010-11-16 ENCOUNTER — Encounter: Payer: Self-pay | Admitting: Family Medicine

## 2010-11-16 LAB — VITAMIN D 1,25 DIHYDROXY
Vitamin D 1, 25 (OH)2 Total: 58 pg/mL (ref 18–72)
Vitamin D2 1, 25 (OH)2: <8 pg/mL
Vitamin D3 1, 25 (OH)2: 58 pg/mL

## 2010-11-20 ENCOUNTER — Encounter: Payer: Self-pay | Admitting: Family Medicine

## 2010-11-20 LAB — HEPATITIS PANEL, ACUTE: HCV Ab: NEGATIVE

## 2010-11-21 ENCOUNTER — Other Ambulatory Visit: Payer: Self-pay | Admitting: Family Medicine

## 2010-12-01 ENCOUNTER — Encounter: Payer: Self-pay | Admitting: Family Medicine

## 2010-12-04 ENCOUNTER — Telehealth: Payer: Self-pay | Admitting: Family Medicine

## 2010-12-04 NOTE — Telephone Encounter (Signed)
At her last visit 3/26 she was referred to Dr Pricilla Holm, if she did not keep that and wants to see Dr. Ulice Brilliant in [place] , pls re route referral, it was entered long ago

## 2010-12-21 ENCOUNTER — Other Ambulatory Visit: Payer: Self-pay | Admitting: Family Medicine

## 2011-03-13 ENCOUNTER — Encounter: Payer: Self-pay | Admitting: Family Medicine

## 2011-03-16 ENCOUNTER — Other Ambulatory Visit: Payer: Self-pay | Admitting: Family Medicine

## 2011-03-16 ENCOUNTER — Encounter: Payer: Self-pay | Admitting: Family Medicine

## 2011-03-19 ENCOUNTER — Encounter: Payer: Self-pay | Admitting: Family Medicine

## 2011-03-19 ENCOUNTER — Ambulatory Visit (INDEPENDENT_AMBULATORY_CARE_PROVIDER_SITE_OTHER): Payer: Medicaid Other | Admitting: Family Medicine

## 2011-03-19 VITALS — BP 140/90 | HR 78 | Resp 16 | Ht 61.5 in | Wt 147.8 lb

## 2011-03-19 DIAGNOSIS — R350 Frequency of micturition: Secondary | ICD-10-CM

## 2011-03-19 DIAGNOSIS — F29 Unspecified psychosis not due to a substance or known physiological condition: Secondary | ICD-10-CM

## 2011-03-19 DIAGNOSIS — R51 Headache: Secondary | ICD-10-CM

## 2011-03-19 DIAGNOSIS — I10 Essential (primary) hypertension: Secondary | ICD-10-CM

## 2011-03-19 DIAGNOSIS — R519 Headache, unspecified: Secondary | ICD-10-CM | POA: Insufficient documentation

## 2011-03-19 DIAGNOSIS — E785 Hyperlipidemia, unspecified: Secondary | ICD-10-CM

## 2011-03-19 LAB — HEPATIC FUNCTION PANEL
AST: 46 U/L — ABNORMAL HIGH (ref 0–37)
Albumin: 4.3 g/dL (ref 3.5–5.2)
Alkaline Phosphatase: 45 U/L (ref 39–117)
Bilirubin, Direct: 0.1 mg/dL (ref 0.0–0.3)
Indirect Bilirubin: 0.3 mg/dL (ref 0.0–0.9)
Total Bilirubin: 0.4 mg/dL (ref 0.3–1.2)

## 2011-03-19 LAB — LIPID PANEL
HDL: 47 mg/dL (ref >39)
Triglycerides: 103 mg/dL (ref <150)

## 2011-03-19 LAB — POCT URINALYSIS DIPSTICK
Bilirubin, UA: NEGATIVE
Glucose, UA: NEGATIVE
Leukocytes, UA: NEGATIVE
Nitrite, UA: NEGATIVE
Urobilinogen, UA: 0.2
pH, UA: 7

## 2011-03-19 MED ORDER — NORTRIPTYLINE HCL 50 MG PO CAPS: ORAL_CAPSULE | ORAL | Status: DC

## 2011-03-19 MED ORDER — KETOROLAC TROMETHAMINE 30 MG/ML IJ SOLN
60.0000 mg | Freq: Once | INTRAMUSCULAR | Status: AC
  Administered 2011-03-19: 60 mg via INTRAMUSCULAR

## 2011-03-19 MED ORDER — DILTIAZEM HCL ER COATED BEADS 240 MG PO CP24: ORAL_CAPSULE | ORAL | Status: DC

## 2011-03-19 NOTE — Assessment & Plan Note (Signed)
Controlled, no change in medication  

## 2011-03-19 NOTE — Assessment & Plan Note (Signed)
Uncontrolled headache with no localizing signs, anti-inflammatory injection today

## 2011-03-19 NOTE — Assessment & Plan Note (Signed)
Uncontrolled. Medication compliance addressed. Commitment to regular exercise, and healthy  eating habits with portion control discussed. DASH diet, and low fat diet discussed, and literature offered. No changes in medication at this time.  

## 2011-03-19 NOTE — Patient Instructions (Addendum)
F/U in 4 months, rectal at that visit.  Fasting lipid and hepatic today.we will call if cholesterol is extremely high, otherwise per your wishes , pls cut back on fried and fatty foods and red meat.  Urine appears uninfected, however it will be sent for further testing. Pls sched your mammogram due august 2 or after.  You will get toradol injection today for headache   Fasting lipid, hepatic and chem 7 in 4 months , BEFORE  next  OV

## 2011-03-19 NOTE — Progress Notes (Signed)
  Subjective:    Patient ID: Julia Stevens, female    DOB: [birth year], 65 y.o.   MRN: [id number]  HPI Headache on right side of head to right ear x 5 to 7 days, may be relieved somewhat by liquid advil Thinks it is due to rheumatism Urinary frequency x 3 days, no fever or chill or flank pain. No other concerns, here for f/u of chronic problems and will have labs today   Review of Systems Denies recent fever or chills. Denies sinus pressure, nasal congestion, ear pain or sore throat. Denies chest congestion, productive cough or wheezing. Denies chest pains, palpitations and leg swelling Denies abdominal pain, nausea, vomiting,diarrhea or constipation.    Denies joint pain, swelling and limitation in mobility. Denies seizures, numbness, or tingling. Denies uncontrolled  depression, anxiety or insomnia. Denies skin break down or rash.        Objective:   Physical Exam Patient alert and oriented and in no cardiopulmonary distress.  HEENT: No facial asymmetry, EOMI, no sinus tenderness,  oropharynx pink and moist.  Neck supple no adenopathy.  Chest: Clear to auscultation bilaterally.  CVS: S1, S2 no murmurs, no S3.  ABD: Soft non tender. Bowel sounds normal.  Ext: No edema  MS: Adequate ROM spine, shoulders, hips and knees.  Skin: Intact, no ulcerations noted, chronic eczema.  Psych: Good eye contact, normal affect. Memory intact not anxious or depressed appearing.  CNS: CN 2-12 intact, power, tone and sensation normal throughout.        Assessment & Plan:   No problem-specific assessment & plan notes found for this encounter.

## 2011-03-23 ENCOUNTER — Other Ambulatory Visit: Payer: Self-pay | Admitting: Family Medicine

## 2011-03-23 DIAGNOSIS — Z139 Encounter for screening, unspecified: Secondary | ICD-10-CM

## 2011-04-02 ENCOUNTER — Ambulatory Visit (HOSPITAL_COMMUNITY)
Admission: RE | Admit: 2011-04-02 | Discharge: 2011-04-02 | Disposition: A | Discharge status: Home / Self Care | Payer: Medicaid Other | Source: Ambulatory Visit | Attending: Family Medicine | Admitting: Family Medicine

## 2011-04-02 DIAGNOSIS — Z1231 Encounter for screening mammogram for malignant neoplasm of breast: Secondary | ICD-10-CM | POA: Insufficient documentation

## 2011-04-02 DIAGNOSIS — Z139 Encounter for screening, unspecified: Secondary | ICD-10-CM

## 2011-04-05 ENCOUNTER — Other Ambulatory Visit: Payer: Self-pay | Admitting: Family Medicine

## 2011-04-12 ENCOUNTER — Telehealth: Payer: Self-pay | Admitting: Family Medicine

## 2011-04-12 MED ORDER — MELOXICAM 15 MG PO TABS: 15.0000 mg | ORAL_TABLET | Freq: Every day | ORAL | Status: DC | PRN

## 2011-04-12 NOTE — Telephone Encounter (Signed)
pls advise and erx meloxicam 15mg  one daily as needed for pain #30 refill 3, this is an anti-inflammatory, let her know

## 2011-04-12 NOTE — Telephone Encounter (Signed)
Patient aware, med sent 

## 2011-04-12 NOTE — Telephone Encounter (Signed)
States hands and arms hurt, states she cannot take tylenol, what can she take for the rheumatoid arthritis. Flares mostly at night.

## 2011-05-22 ENCOUNTER — Other Ambulatory Visit: Payer: Self-pay | Admitting: Family Medicine

## 2011-07-18 ENCOUNTER — Encounter: Payer: Self-pay | Admitting: Family Medicine

## 2011-07-20 ENCOUNTER — Ambulatory Visit: Payer: Medicaid Other | Admitting: Family Medicine

## 2011-07-21 ENCOUNTER — Other Ambulatory Visit: Payer: Self-pay | Admitting: Family Medicine

## 2011-07-23 ENCOUNTER — Encounter: Payer: Self-pay | Admitting: Family Medicine

## 2011-07-23 ENCOUNTER — Ambulatory Visit (INDEPENDENT_AMBULATORY_CARE_PROVIDER_SITE_OTHER): Payer: Medicaid Other | Admitting: Family Medicine

## 2011-07-23 VITALS — BP 118/76 | HR 85 | Resp 16 | Ht 61.5 in | Wt 146.1 lb

## 2011-07-23 DIAGNOSIS — L259 Unspecified contact dermatitis, unspecified cause: Secondary | ICD-10-CM

## 2011-07-23 DIAGNOSIS — R5383 Other fatigue: Secondary | ICD-10-CM

## 2011-07-23 DIAGNOSIS — R5381 Other malaise: Secondary | ICD-10-CM

## 2011-07-23 DIAGNOSIS — M542 Cervicalgia: Secondary | ICD-10-CM

## 2011-07-23 DIAGNOSIS — F29 Unspecified psychosis not due to a substance or known physiological condition: Secondary | ICD-10-CM

## 2011-07-23 DIAGNOSIS — I10 Essential (primary) hypertension: Secondary | ICD-10-CM

## 2011-07-23 DIAGNOSIS — Z23 Encounter for immunization: Secondary | ICD-10-CM

## 2011-07-23 DIAGNOSIS — E785 Hyperlipidemia, unspecified: Secondary | ICD-10-CM

## 2011-07-23 MED ORDER — INFLUENZA VAC TYPES A & B PF IM SUSP: 0.5000 mL | Freq: Once | INTRAMUSCULAR | Status: DC

## 2011-07-23 NOTE — Progress Notes (Signed)
  Subjective:    Patient ID: Julia Stevens, female    DOB: [birth year], 65 y.o.   MRN: [id number]  HPI The PT is here for follow up and re-evaluation of chronic medical conditions, medication management and review of any available recent lab and radiology data.  Preventive health is updated, specifically  Cancer screening and Immunization.   Questions or concerns regarding consultations or procedures which the PT has had in the interim are  addressed. The PT denies any adverse reactions to current medications since the last visit.  4  day h/o increased neck pain , with no associated trauma      Review of Systems See HPI Denies recent fever or chills. Denies sinus pressure, nasal congestion, ear pain or sore throat. Denies chest congestion, productive cough or wheezing. Denies chest pains, palpitations and leg swelling Denies abdominal pain, nausea, vomiting,diarrhea or constipation.   Denies dysuria, frequency, hesitancy or incontinence.  Denies headaches, seizures, numbness, or tingling. Denies depression, anxiety or insomnia. States rash of eczema is markedly improved      Objective:   Physical Exam Patient alert and oriented and in no cardiopulmonary distress.  HEENT: No facial asymmetry, EOMI, no sinus tenderness,  oropharynx pink and moist.  Neck decreased ROM, no adenopathy.  Chest: Clear to auscultation bilaterally.  CVS: S1, S2 no murmurs, no S3.  ABD: Soft non tender. Bowel sounds normal.  Ext: No edema  MS: Adequate ROM  shoulders, hips and knees.  Skin: Intact, no ulcerations or rash noted.  Psych: Good eye contact, normal affect. Memory intact not anxious or depressed appearing.  CNS: CN 2-12 intact, power, tone and sensation normal throughout.        Assessment & Plan:

## 2011-07-23 NOTE — Assessment & Plan Note (Signed)
Low fat diet encouraged.  

## 2011-07-23 NOTE — Patient Instructions (Signed)
F/u first week in April.  Fasting CBc, chem 7, lipid and tSH March 26 or after.   Toradol today for neck pain, do not take meloxicam today please.  TdaP and flu vaccines today  No changes in medication.  All the best, and call if you need me before

## 2011-07-23 NOTE — Assessment & Plan Note (Signed)
Controlled, no change in medication  

## 2011-07-24 NOTE — Assessment & Plan Note (Signed)
Increased neck pain x 4 days, no associated trauma, toradol administered in office, pt to resume home anti inflammatory in am

## 2011-07-24 NOTE — Assessment & Plan Note (Signed)
Well controlled on current regime 

## 2011-07-24 NOTE — Assessment & Plan Note (Signed)
Stable on chronic med 

## 2011-08-27 ENCOUNTER — Other Ambulatory Visit: Payer: Self-pay | Admitting: Family Medicine

## 2011-09-19 ENCOUNTER — Other Ambulatory Visit: Payer: Self-pay | Admitting: Family Medicine

## 2011-10-19 ENCOUNTER — Other Ambulatory Visit: Payer: Self-pay | Admitting: Family Medicine

## 2011-11-19 ENCOUNTER — Ambulatory Visit: Payer: Medicaid Other | Admitting: Family Medicine

## 2011-11-21 ENCOUNTER — Encounter: Payer: Self-pay | Admitting: Family Medicine

## 2011-11-21 ENCOUNTER — Ambulatory Visit (INDEPENDENT_AMBULATORY_CARE_PROVIDER_SITE_OTHER): Payer: Medicaid Other | Admitting: Family Medicine

## 2011-11-21 VITALS — BP 126/76 | HR 85 | Resp 18 | Ht 61.5 in | Wt 147.0 lb

## 2011-11-21 DIAGNOSIS — I10 Essential (primary) hypertension: Secondary | ICD-10-CM

## 2011-11-21 DIAGNOSIS — L259 Unspecified contact dermatitis, unspecified cause: Secondary | ICD-10-CM

## 2011-11-21 DIAGNOSIS — R21 Rash and other nonspecific skin eruption: Secondary | ICD-10-CM

## 2011-11-21 DIAGNOSIS — F29 Unspecified psychosis not due to a substance or known physiological condition: Secondary | ICD-10-CM

## 2011-11-21 MED ORDER — SULFAMETHOXAZOLE-TRIMETHOPRIM 800-160 MG PO TABS: 1.0000 | ORAL_TABLET | Freq: Two times a day (BID) | ORAL | Status: AC

## 2011-11-21 MED ORDER — POTASSIUM CHLORIDE CRYS ER 10 MEQ PO TBCR: 10.0000 meq | EXTENDED_RELEASE_TABLET | Freq: Two times a day (BID) | ORAL | Status: DC

## 2011-11-21 MED ORDER — TERBINAFINE HCL 250 MG PO TABS: 250.0000 mg | ORAL_TABLET | Freq: Every day | ORAL | Status: DC

## 2011-11-21 MED ORDER — MELOXICAM 15 MG PO TABS: 15.0000 mg | ORAL_TABLET | Freq: Every day | ORAL | Status: DC | PRN

## 2011-11-21 NOTE — Progress Notes (Signed)
  Subjective:    Patient ID: Julia Stevens, female    DOB: [birth year], 66 y.o.   MRN: [id number]  HPI The PT is here for follow up and re-evaluation of chronic medical conditions, medication management and review of any available recent lab and radiology data.  Preventive health is updated, specifically  Cancer screening and Immunization.   Questions or concerns regarding consultations or procedures which the PT has had in the interim are  addressed. The PT denies any adverse reactions to current medications since the last visit.  C/o pruritic rash on dorsum of right foot worsening in the past month, no drainage from same      Review of Systems See HPI Denies recent fever or chills. Denies sinus pressure, nasal congestion, ear pain or sore throat. Denies chest congestion, productive cough or wheezing. Denies chest pains, palpitations and leg swelling Denies abdominal pain, nausea, vomiting,diarrhea or constipation.   Denies dysuria, frequency, hesitancy or incontinence. Denies joint pain, swelling and limitation in mobility. Denies headaches, seizures, numbness, or tingling. Denies depression, anxiety or insomnia.         Objective:   Physical Exam  Patient alert and oriented and in no cardiopulmonary distress.  HEENT: No facial asymmetry, EOMI, no sinus tenderness,  oropharynx pink and moist.  Neck supple no adenopathy.  Chest: Clear to auscultation bilaterally.  CVS: S1, S2 no murmurs, no S3.  ABD: Soft non tender. Bowel sounds normal.  Ext: No edema  MS: Adequate ROM spine, shoulders, hips and knees.  Skin: hyperpigmented, scaly rash on dorsum of righ foot  Psych: Good eye contact, normal affect. Memory intact not anxious or depressed appearing.  CNS: CN 2-12 intact, power, tone and sensation normal throughout.       Assessment & Plan:

## 2011-11-21 NOTE — Patient Instructions (Signed)
F/U in 4 month, please call if you need me before  Blood pressure is excellent , no medication change.  Potassium is changed to a smaller size pill, take TWICE daily   Two new medications sent in for rash on the right foot

## 2011-11-21 NOTE — Assessment & Plan Note (Signed)
2 month hyperpigmented macular rash on right foot

## 2011-11-25 NOTE — Assessment & Plan Note (Signed)
Stable on medication

## 2011-11-25 NOTE — Assessment & Plan Note (Signed)
Controlled, no change in medication  

## 2011-12-10 ENCOUNTER — Emergency Department (HOSPITAL_COMMUNITY)
Admission: EM | Admit: 2011-12-10 | Discharge: 2011-12-10 | Disposition: A | Discharge status: Home / Self Care | Payer: Medicaid Other | Attending: Emergency Medicine | Admitting: Emergency Medicine

## 2011-12-10 ENCOUNTER — Encounter (HOSPITAL_COMMUNITY): Payer: Self-pay | Admitting: *Deleted

## 2011-12-10 DIAGNOSIS — I10 Essential (primary) hypertension: Secondary | ICD-10-CM | POA: Insufficient documentation

## 2011-12-10 DIAGNOSIS — Z8739 Personal history of other diseases of the musculoskeletal system and connective tissue: Secondary | ICD-10-CM | POA: Insufficient documentation

## 2011-12-10 DIAGNOSIS — Z79899 Other long term (current) drug therapy: Secondary | ICD-10-CM | POA: Insufficient documentation

## 2011-12-10 DIAGNOSIS — B029 Zoster without complications: Secondary | ICD-10-CM

## 2011-12-10 DIAGNOSIS — E785 Hyperlipidemia, unspecified: Secondary | ICD-10-CM | POA: Insufficient documentation

## 2011-12-10 HISTORY — DX: Unspecified osteoarthritis, unspecified site: M19.90

## 2011-12-10 MED ORDER — HYDROCODONE-ACETAMINOPHEN 5-325 MG PO TABS: 1.0000 | ORAL_TABLET | Freq: Four times a day (QID) | ORAL | Status: AC | PRN

## 2011-12-10 MED ORDER — ACYCLOVIR 400 MG PO TABS: 800.0000 mg | ORAL_TABLET | Freq: Every day | ORAL | Status: DC

## 2011-12-10 NOTE — ED Notes (Signed)
Rash to  Rt side of face and soreness inside mouth.

## 2011-12-10 NOTE — ED Notes (Signed)
Assumed care for discharge.  Discharge instructions given and reviewed with patient.  Prescriptions given for Acyclovir and Vicodin; effects and use explained.  Patient verbalized understanding to complete medication and to f/u with PMD as needed.  Patient ambulatory with steady gait; discharged home in good condition.

## 2011-12-10 NOTE — Discharge Instructions (Signed)
Shingles Shingles is caused by the same virus that causes chickenpox (varicella zoster virus or VZV). Shingles often occurs many years or decades after having chickenpox. That is why it is more common in adults older than 50 years. The virus reactivates and breaks out as an infection in a nerve root. SYMPTOMS   The initial feeling (sensations) may be pain. This pain is usually described as:   Burning.   Stabbing.   Throbbing.   Tingling in the nerve root.   A red rash will follow in a couple days. The rash may occur in any area of the body and is usually on one side (unilateral) of the body in a band or belt-like pattern. The rash usually starts out as very small blisters (vesicles). They will dry up after 7 to 10 days. This is not usually a significant problem except for the pain it causes.   Long-lasting (chronic) pain is more likely in an elderly person. It can last months to years. This condition is called postherpetic neuralgia.  Shingles can be an extremely severe infection in someone with AIDS, a weakened immune system, or with forms of leukemia. It can also be severe if you are taking transplant medicines or other medicines that weaken the immune system. TREATMENT  Your caregiver will often treat you with:  Antiviral drugs.   Anti-inflammatory drugs.   Pain medicines.  Bed rest is very important in preventing the pain associated with herpes zoster (postherpetic neuralgia). Application of heat in the form of a hot water bottle or electric heating pad or gentle pressure with the hand is recommended to help with the pain or discomfort. PREVENTION  A varicella zoster vaccine is available to help protect against the virus. The Food and Drug Administration approved the varicella zoster vaccine for individuals 59 years of age and older. HOME CARE INSTRUCTIONS   Cool compresses to the area of rash may be helpful.   Only take over-the-counter or prescription medicines for pain,  discomfort, or fever as directed by your caregiver.   Avoid contact with:   Babies.   Pregnant women.   Children with eczema.   Elderly people with transplants.   People with chronic illnesses, such as leukemia and AIDS.   If the area involved is on your face, you may receive a referral for follow-up to a specialist. It is very important to keep all follow-up appointments. This will help avoid eye complications, chronic pain, or disability.  SEEK IMMEDIATE MEDICAL CARE IF:   You develop any pain (headache) in the area of the face or eye. This must be followed carefully by your caregiver or ophthalmologist. An infection in part of your eye (cornea) can be very serious. It could lead to blindness.   You do not have pain relief from prescribed medicines.   Your redness or swelling spreads.   The area involved becomes very swollen and painful.   You have a fever.   You notice any red or painful lines extending away from the affected area toward your heart (lymphangitis).   Your condition is worsening or has changed.  Document Released: 08/06/2005 Document Revised: 07/26/2011 Document Reviewed: 07/11/2009 [facility] Patient Information 2012 [place], [place].  Take acyclovir as directed for the next 7 days. Make an appointment to followup with Dr. Lodema Hong in the next 2 days. Return for any new or worse symptoms.

## 2011-12-10 NOTE — ED Provider Notes (Signed)
History   This chart was scribed for Julia Jakes, MD by Toya Smothers. The patient was seen in room APA12/APA12. Patient's care was started at 1859.   CSN: [id number]  Arrival date & time 12/10/11  1859   First MD Initiated Contact with Patient 12/10/11 1945      Chief Complaint  Patient presents with  . Rash    (Consider location/radiation/quality/duration/timing/severity/associated sxs/prior treatment) HPI Lindzey Zent is a 66 y.o. female who presents to the Emergency Department complaining of gradual severe rash described as crusting and oozing of vesicles, gradually radiating from right side of mouth to face and neck, isolated to the right side of midline, onset 3 days ago. Pt list pain to area of rash as associated symptoms. Pt has h/o chicken pox. Pt denies h/o similar symptoms and vaccination to shingles. Pt state h/o mother having shingles.    Past Medical History  Diagnosis Date  . Dysphagia   . Hyperlipidemia   . Eczema   . Psychosis   . Hypertension   . Scleroderma   . Arthritis     Past Surgical History  Procedure Date  . Total abdominal hysterectomy w/ bilateral salpingoophorectomy 1994  . Abdominal hysterectomy     Family History  Problem Relation Age of Onset  . Hypertension Mother   . Heart disease Mother     CAD  . Hypertension Father      Severe DJD/ macrobacterium  . Hypertension Sister   . Hypertension Sister   . Fibromyalgia Sister     infection  . Diabetes Brother     History  Substance Use Topics  . Smoking status: Never Smoker   . Smokeless tobacco: Not on file  . Alcohol Use: No    OB History    Grav Para Term Preterm Abortions TAB SAB Ect Mult Living                  Review of Systems  Constitutional: Negative for fever and chills.  HENT: Negative for rhinorrhea.   Eyes: Negative for pain.  Respiratory: Negative for cough and shortness of breath.   Cardiovascular: Negative for chest pain.  Gastrointestinal:  Negative for nausea, vomiting, abdominal pain and diarrhea.  Genitourinary: Negative for dysuria.  Musculoskeletal: Negative for back pain.  Skin: Positive for rash.  Neurological: Negative for weakness and headaches.    Allergies  Ciprofloxacin and Penicillins  Home Medications   Current Outpatient Rx  Name Route Sig Dispense Refill  . ACYCLOVIR 400 MG PO TABS Oral Take 2 tablets (800 mg total) by mouth 5 (five) times daily. 50 tablet 0  . CALCIUM CARBONATE 600 MG PO TABS Oral Take 600 mg by mouth daily.      Marland Kitchen CARDIZEM CD 240 MG PO CP24  TAKE (1) CAPSULE BY MOUTH ONCE DAILY. 30 each 3  . HYDROCHLOROTHIAZIDE 12.5 MG PO CAPS  TAKE (1) CAPSULE BY MOUTH ONCE DAILY. 30 capsule 4  . HYDROCODONE-ACETAMINOPHEN 5-325 MG PO TABS Oral Take 1-2 tablets by mouth every 6 (six) hours as needed for pain. 10 tablet 0  . MELOXICAM 15 MG PO TABS Oral Take 1 tablet (15 mg total) by mouth daily as needed for pain. 30 tablet 3  . MULTIVITAMINS PO CAPS Oral Take 1 capsule by mouth daily.      Marland Kitchen NORTRIPTYLINE HCL 50 MG PO CAPS  2 tabs by mouth at bedtime 60 capsule 4  . POTASSIUM CHLORIDE CRYS ER 10 MEQ PO TBCR Oral Take 1  tablet (10 mEq total) by mouth 2 (two) times daily. 60 tablet 5  . TERBINAFINE HCL 250 MG PO TABS Oral Take 1 tablet (250 mg total) by mouth daily. 42 tablet 1    BP 144/90  Pulse 116  Temp(Src) 99.7 F (37.6 C) (Oral)  Resp 20  SpO2 100%  Physical Exam  Nursing note and vitals reviewed. Constitutional: She is oriented to person, place, and time. She appears well-developed and well-nourished. No distress.  HENT:  Head: Normocephalic and atraumatic.  Eyes: EOM are normal. Pupils are equal, round, and reactive to light.  Neck: Neck supple. No tracheal deviation present.  Cardiovascular: Normal rate, regular rhythm, normal heart sounds and intact distal pulses.  Exam reveals no gallop and no friction rub.   No murmur heard. Pulmonary/Chest: Effort normal. No respiratory distress.    Abdominal: Soft. She exhibits no distension.  Musculoskeletal: Normal range of motion. She exhibits no edema.  Neurological: She is alert and oriented to person, place, and time. No sensory deficit.  Skin: Skin is warm and dry. Rash noted.       Vesicles and crusting to the right of midline on the face and neck  Psychiatric: She has a normal mood and affect. Her behavior is normal.    ED Course  Procedures (including critical care time) DIAGNOSTIC STUDIES: Oxygen Saturation is 100% on room air, normal by my interpretation.    COORDINATION OF CARE: 8:55PM-Patient informed of current plan for treatment and evaluation and agrees with plan at this time. Will d/c home after explaining clinical impression and treatment     Labs Reviewed - No data to display No results found.   1. Shingles       MDM  Right side of face vesicular rash consistent with shingles.      I personally performed the services described in this documentation, which was scribed in my presence. The recorded information has been reviewed and considered.      Julia Jakes, MD 12/15/11 2126

## 2011-12-17 ENCOUNTER — Ambulatory Visit (INDEPENDENT_AMBULATORY_CARE_PROVIDER_SITE_OTHER): Payer: Medicaid Other | Admitting: Family Medicine

## 2011-12-17 ENCOUNTER — Encounter: Payer: Self-pay | Admitting: Family Medicine

## 2011-12-17 ENCOUNTER — Other Ambulatory Visit: Payer: Self-pay | Admitting: Family Medicine

## 2011-12-17 VITALS — BP 138/82 | HR 95 | Resp 18 | Ht 61.5 in | Wt 146.0 lb

## 2011-12-17 DIAGNOSIS — B028 Zoster with other complications: Secondary | ICD-10-CM | POA: Insufficient documentation

## 2011-12-17 DIAGNOSIS — L0291 Cutaneous abscess, unspecified: Secondary | ICD-10-CM

## 2011-12-17 DIAGNOSIS — L0390 Cellulitis, unspecified: Secondary | ICD-10-CM | POA: Insufficient documentation

## 2011-12-17 MED ORDER — DOXYCYCLINE HYCLATE 100 MG PO TABS: 100.0000 mg | ORAL_TABLET | Freq: Two times a day (BID) | ORAL | Status: AC

## 2011-12-17 MED ORDER — ACYCLOVIR 800 MG PO TABS: ORAL_TABLET | ORAL | Status: DC

## 2011-12-17 MED ORDER — GABAPENTIN 100 MG PO CAPS: ORAL_CAPSULE | ORAL | Status: DC

## 2011-12-17 NOTE — Assessment & Plan Note (Signed)
Bacterial superinfection of shingles rash, will treat aggressively

## 2011-12-17 NOTE — Progress Notes (Signed)
  Subjective:    Patient ID: Julia Stevens, female    DOB: 15-Dec-1945, 66 y.o.   MRN: [id number]  HPI  Pt seen in the ED on 4/22 wih a 3 day h/o painful rash on right side of face, involving, mouth , throat and extending to right ear. Has been on acyclovir, some improvement, though still c/o pain with swallowing and has open sore on right lower jaw, requests refill  On hydrocodone  Review of Systems See HPI Denies recent fever or chills. Denies sinus pressure, nasal congestion, ear pain or sore throat. Denies chest congestion, productive cough or wheezing. Denies  seizures, numbness, or tingling.        Objective:   Physical Exam Patient alert and oriented and in no cardiopulmonary distress.  HEENT: No facial asymmetry, EOMI, no sinus tenderness,  oropharynx pink and moist.  Neck supple no adenopathy.No lesions seen in the mouth  Chest: Clear to auscultation bilaterally.  CVS: S1, S2 no murmurs, no S3.  ABD: Soft non tender. Bowel sounds normal.  Ext: No edema    Skin:open skin lesions with crusty drainage on right jaw, also healed  Lesions at  Right ear Psych: Good eye contact, normal affect. Memory intact not anxious or depressed appearing.  CNS: CN 2-12 intact, power, tone and sensation normal throughout.        Assessment & Plan:

## 2011-12-17 NOTE — Patient Instructions (Signed)
F/u in 10 days.  You are being re treated for shingles, will get a new medication for pain, and antibiotic is also sent in for treatment for bacterial infection  Please take all medication as prescribed.  Please call if you have problems

## 2011-12-17 NOTE — Assessment & Plan Note (Signed)
Severe rash on right jaw with open lesions despite recent treatment will extend course of therapy to 10 additional days

## 2011-12-19 ENCOUNTER — Telehealth: Payer: Self-pay | Admitting: Family Medicine

## 2011-12-20 ENCOUNTER — Other Ambulatory Visit: Payer: Self-pay | Admitting: Family Medicine

## 2011-12-20 MED ORDER — GABAPENTIN 100 MG PO CAPS: 100.0000 mg | ORAL_CAPSULE | Freq: Three times a day (TID) | ORAL | Status: DC

## 2011-12-20 NOTE — Telephone Encounter (Signed)
I have sent in increase in gabapentin to one capsule 3 times daily, not just at night, pls let her know and fax in new script

## 2011-12-20 NOTE — Telephone Encounter (Signed)
Script faxed, patient aware.

## 2011-12-20 NOTE — Telephone Encounter (Signed)
Please advise 

## 2011-12-31 ENCOUNTER — Encounter: Payer: Self-pay | Admitting: Family Medicine

## 2011-12-31 ENCOUNTER — Other Ambulatory Visit: Payer: Self-pay

## 2011-12-31 ENCOUNTER — Ambulatory Visit (INDEPENDENT_AMBULATORY_CARE_PROVIDER_SITE_OTHER): Payer: Medicaid Other | Admitting: Family Medicine

## 2011-12-31 VITALS — BP 118/76 | HR 65 | Resp 18 | Ht 61.5 in | Wt 146.0 lb

## 2011-12-31 DIAGNOSIS — R5381 Other malaise: Secondary | ICD-10-CM

## 2011-12-31 DIAGNOSIS — I10 Essential (primary) hypertension: Secondary | ICD-10-CM

## 2011-12-31 DIAGNOSIS — M069 Rheumatoid arthritis, unspecified: Secondary | ICD-10-CM

## 2011-12-31 DIAGNOSIS — F29 Unspecified psychosis not due to a substance or known physiological condition: Secondary | ICD-10-CM

## 2011-12-31 DIAGNOSIS — B028 Zoster with other complications: Secondary | ICD-10-CM

## 2011-12-31 DIAGNOSIS — R21 Rash and other nonspecific skin eruption: Secondary | ICD-10-CM

## 2011-12-31 DIAGNOSIS — L0390 Cellulitis, unspecified: Secondary | ICD-10-CM

## 2011-12-31 MED ORDER — DILTIAZEM HCL ER COATED BEADS 240 MG PO CP24: 240.0000 mg | ORAL_CAPSULE | Freq: Every day | ORAL | Status: DC

## 2011-12-31 MED ORDER — HYDROQUINONE 2 % EX CREA: TOPICAL_CREAM | Freq: Two times a day (BID) | CUTANEOUS | Status: DC

## 2011-12-31 NOTE — Patient Instructions (Addendum)
F/u in early August.  Please call if you need me before Fasting labs end July.CBc, fasting chem 7, lipid,hepatic, TSH and vit D  Cream sent in for use twice daily to dark area on your face where you had shingles

## 2011-12-31 NOTE — Assessment & Plan Note (Signed)
Markedly improved. No open skin lesions currently.  Area which had been involved, right lower jaw is markedly hyperpigmented, bleaching cream prescribed

## 2011-12-31 NOTE — Assessment & Plan Note (Signed)
Controlled, no change in medication  

## 2011-12-31 NOTE — Assessment & Plan Note (Signed)
Currently no recent flare, stable

## 2011-12-31 NOTE — Progress Notes (Signed)
  Subjective:    Patient ID: Julia Stevens, female    DOB: November 29, 1945, 66 y.o.   MRN: [id number]  HPI The PT is here for follow up of complicated zoster affecting right side of face, and re-evaluation of chronic medical conditions, medication management and review of any available recent lab and radiology data.  Preventive health is updated, specifically  Cancer screening and Immunization.   Questions or concerns regarding consultations or procedures which the PT has had in the interim are  addressed. The PT denies any adverse reactions to current medications since the last visit.  C/o darkening and hyperpigmentation of right jaw, requests bleaching cream, which is very appropriate. Denies burning pain, states sometimes has "numb feeling" in area that was affected     Review of Systems See HPI Denies recent fever or chills. Denies sinus pressure, nasal congestion, ear pain or sore throat. Denies chest congestion, productive cough or wheezing. Denies chest pains, palpitations and leg swelling Denies abdominal pain, nausea, vomiting,diarrhea or constipation.   Denies dysuria, frequency, hesitancy or incontinence. Denies joint pain, swelling and limitation in mobility. Denies headaches, seizures, numbness, or tingling. Denies depression, anxiety or insomnia. .        Objective:   Physical Exam  Patient alert and oriented and in no cardiopulmonary distress.  HEENT: No facial asymmetry, EOMI, no sinus tenderness,  oropharynx pink and moist.  Neck supple no adenopathy.  Chest: Clear to auscultation bilaterally.  CVS: S1, S2 no murmurs, no S3.  ABD: Soft non tender. Bowel sounds normal.  Ext: No edema  MS: Adequate ROM spine, shoulders, hips and knees.  Skin: Intact,hyperpigmentation of healed lesion on right lower jaw, no ulceration on skin , inside of mouth or right ear  Psych: Good eye contact, normal affect. Memory intact not anxious or depressed appearing.  CNS: CN  2-12 intact, power, tone and sensation normal throughout.       Assessment & Plan:

## 2011-12-31 NOTE — Assessment & Plan Note (Signed)
resolved 

## 2012-01-16 ENCOUNTER — Other Ambulatory Visit: Payer: Self-pay | Admitting: Family Medicine

## 2012-01-17 ENCOUNTER — Telehealth: Payer: Self-pay | Admitting: Family Medicine

## 2012-01-18 MED ORDER — GABAPENTIN 100 MG PO CAPS: 100.0000 mg | ORAL_CAPSULE | Freq: Three times a day (TID) | ORAL | Status: DC

## 2012-01-18 NOTE — Telephone Encounter (Signed)
Refill sent.

## 2012-03-04 ENCOUNTER — Telehealth: Payer: Self-pay | Admitting: Family Medicine

## 2012-03-04 NOTE — Telephone Encounter (Signed)
Order sent.

## 2012-03-17 ENCOUNTER — Other Ambulatory Visit: Payer: Self-pay | Admitting: Family Medicine

## 2012-03-18 LAB — CBC
HCT: 36.5 % (ref 36.0–46.0)
Hemoglobin: 12.7 g/dL (ref 12.0–15.0)
MCH: 31.4 pg (ref 26.0–34.0)
MCHC: 34.8 g/dL (ref 30.0–36.0)
MCV: 90.1 fL (ref 78.0–100.0)
RDW: 14.3 % (ref 11.5–15.5)

## 2012-03-18 LAB — CMP AND LIVER
ALT: 28 U/L (ref 0–35)
AST: 36 U/L (ref 0–37)
Albumin: 4.3 g/dL (ref 3.5–5.2)
BUN: 12 mg/dL (ref 6–23)
Bilirubin, Direct: 0.1 mg/dL (ref 0.0–0.3)
CO2: 32 mEq/L (ref 19–32)
Calcium: 9.2 mg/dL (ref 8.4–10.5)
Chloride: 103 mEq/L (ref 96–112)
Potassium: 3.9 mEq/L (ref 3.5–5.3)

## 2012-03-18 LAB — LIPID PANEL
LDL Cholesterol: 117 mg/dL — ABNORMAL HIGH (ref 0–99)
Triglycerides: 146 mg/dL (ref <150)
VLDL: 29 mg/dL (ref 0–40)

## 2012-03-18 LAB — TSH: TSH: 3.159 u[IU]/mL (ref 0.350–4.500)

## 2012-03-18 LAB — VITAMIN D 25 HYDROXY (VIT D DEFICIENCY, FRACTURES): Vit D, 25-Hydroxy: 30 ng/mL (ref 30–89)

## 2012-03-19 ENCOUNTER — Other Ambulatory Visit: Payer: Self-pay | Admitting: Family Medicine

## 2012-03-24 ENCOUNTER — Ambulatory Visit (INDEPENDENT_AMBULATORY_CARE_PROVIDER_SITE_OTHER): Payer: Medicaid Other | Admitting: Family Medicine

## 2012-03-24 ENCOUNTER — Encounter: Payer: Self-pay | Admitting: Family Medicine

## 2012-03-24 VITALS — BP 120/80 | HR 86 | Resp 18 | Ht 61.5 in | Wt 150.1 lb

## 2012-03-24 DIAGNOSIS — B369 Superficial mycosis, unspecified: Secondary | ICD-10-CM

## 2012-03-24 DIAGNOSIS — I10 Essential (primary) hypertension: Secondary | ICD-10-CM

## 2012-03-24 DIAGNOSIS — Z1211 Encounter for screening for malignant neoplasm of colon: Secondary | ICD-10-CM

## 2012-03-24 DIAGNOSIS — M79609 Pain in unspecified limb: Secondary | ICD-10-CM

## 2012-03-24 DIAGNOSIS — M79643 Pain in unspecified hand: Secondary | ICD-10-CM

## 2012-03-24 DIAGNOSIS — B028 Zoster with other complications: Secondary | ICD-10-CM

## 2012-03-24 DIAGNOSIS — F29 Unspecified psychosis not due to a substance or known physiological condition: Secondary | ICD-10-CM

## 2012-03-24 LAB — POC HEMOCCULT BLD/STL (OFFICE/1-CARD/DIAGNOSTIC): Fecal Occult Blood, POC: NEGATIVE

## 2012-03-24 MED ORDER — KETOROLAC TROMETHAMINE 60 MG/2ML IJ SOLN
60.0000 mg | Freq: Once | INTRAMUSCULAR | Status: AC
  Administered 2012-03-24: 60 mg via INTRAMUSCULAR

## 2012-03-24 MED ORDER — CLOTRIMAZOLE-BETAMETHASONE 1-0.05 % EX CREA: TOPICAL_CREAM | Freq: Two times a day (BID) | CUTANEOUS | Status: DC

## 2012-03-24 MED ORDER — NORTRIPTYLINE HCL 50 MG PO CAPS: ORAL_CAPSULE | ORAL | Status: DC

## 2012-03-24 MED ORDER — METHYLPREDNISOLONE ACETATE 80 MG/ML IJ SUSP
80.0000 mg | Freq: Once | INTRAMUSCULAR | Status: AC
  Administered 2012-03-24: 80 mg via INTRAMUSCULAR

## 2012-03-24 NOTE — Patient Instructions (Addendum)
F/u in 4.5 month, please call if you need me before  Call in October for flu vaccine  Rectal exam today, and you need a colonoscopy  Toradol and depo medrol today for hand pain.  Recent labs are excellent as is your blood pressure.  Medication is prescribed for the rash on your right foot   Mammogram is due this month and will be scheduled at checkout

## 2012-03-24 NOTE — Progress Notes (Signed)
  Subjective:    Patient ID: Julia Stevens, female    DOB: 08/02/1946, 66 y.o.   MRN: [id number]  HPI The PT is here for follow up and re-evaluation of chronic medical conditions, medication management and review of any available recent lab and radiology data.  Preventive health is updated, specifically  Cancer screening and Immunization.   Questions or concerns regarding consultations or procedures which the PT has had in the interim are  addressed. The PT denies any adverse reactions to current medications since the last visit.  There are no new concerns.  C/o dark pruritic rash on dorsum of right foot      Review of Systems See HPI Denies recent fever or chills. Denies sinus pressure, nasal congestion, ear pain or sore throat. Denies chest congestion, productive cough or wheezing. Denies chest pains, palpitations and leg swelling Denies abdominal pain, nausea, vomiting,diarrhea or constipation.   Denies dysuria, frequency, hesitancy or incontinence. Denies joint pain, swelling and limitation in mobility. Denies headaches, seizures, numbness, or tingling. Denies depression, anxiety or insomnia.         Objective:   Physical Exam Patient alert and oriented and in no cardiopulmonary distress.  HEENT: No facial asymmetry, EOMI, no sinus tenderness,  oropharynx pink and moist.  Neck supple no adenopathy.  Chest: Clear to auscultation bilaterally.  CVS: S1, S2 no murmurs, no S3.  ABD: Soft non tender. Bowel sounds normal.  Ext: No edema  MS: Adequate ROM spine, shoulders, hips and knees.  Skin: Intact, fungal hyperpigmented, hyperpigmented  rash noted on dorsum of foot. Generalized dry skin and eczema  Psych: Good eye contact, normal affect. Memory intact not anxious or depressed appearing.  CNS: CN 2-12 intact, power, tone and sensation normal throughout.        Assessment & Plan:

## 2012-03-25 ENCOUNTER — Ambulatory Visit: Payer: Medicaid Other | Admitting: Family Medicine

## 2012-03-25 NOTE — Assessment & Plan Note (Signed)
Controlled, no change in medication  

## 2012-03-25 NOTE — Assessment & Plan Note (Signed)
Stable on medication. Continue same

## 2012-03-25 NOTE — Assessment & Plan Note (Signed)
Rash resolved, but still has neuralgia

## 2012-03-25 NOTE — Assessment & Plan Note (Signed)
Topical medication prescribed 

## 2012-05-27 ENCOUNTER — Other Ambulatory Visit: Payer: Self-pay | Admitting: Family Medicine

## 2012-07-28 ENCOUNTER — Ambulatory Visit (INDEPENDENT_AMBULATORY_CARE_PROVIDER_SITE_OTHER): Payer: Medicaid Other | Admitting: Family Medicine

## 2012-07-28 ENCOUNTER — Encounter: Payer: Self-pay | Admitting: Family Medicine

## 2012-07-28 VITALS — BP 124/72 | HR 80 | Resp 18 | Ht 61.5 in | Wt 153.1 lb

## 2012-07-28 DIAGNOSIS — F29 Unspecified psychosis not due to a substance or known physiological condition: Secondary | ICD-10-CM

## 2012-07-28 DIAGNOSIS — B028 Zoster with other complications: Secondary | ICD-10-CM

## 2012-07-28 DIAGNOSIS — M069 Rheumatoid arthritis, unspecified: Secondary | ICD-10-CM

## 2012-07-28 DIAGNOSIS — D72819 Decreased white blood cell count, unspecified: Secondary | ICD-10-CM

## 2012-07-28 DIAGNOSIS — R21 Rash and other nonspecific skin eruption: Secondary | ICD-10-CM

## 2012-07-28 DIAGNOSIS — I10 Essential (primary) hypertension: Secondary | ICD-10-CM

## 2012-07-28 MED ORDER — MELOXICAM 15 MG PO TABS: 15.0000 mg | ORAL_TABLET | Freq: Every day | ORAL | Status: DC | PRN

## 2012-07-28 MED ORDER — GABAPENTIN 100 MG PO CAPS: 100.0000 mg | ORAL_CAPSULE | Freq: Every day | ORAL | Status: DC

## 2012-07-28 MED ORDER — HYDROCHLOROTHIAZIDE 12.5 MG PO CAPS: 12.5000 mg | ORAL_CAPSULE | Freq: Every day | ORAL | Status: DC

## 2012-07-28 NOTE — Progress Notes (Signed)
  Subjective:    Patient ID: Julia Stevens, female    DOB: [birth year], 66 y.o.   MRN: [id number]  HPI  The PT is here for follow up and re-evaluation of chronic medical conditions, medication management and review of any available recent lab and radiology data.  Preventive health is updated, specifically  Cancer screening and Immunization.   Questions or concerns regarding consultations or procedures which the PT has had in the interim are  addressed. The PT denies any adverse reactions to current medications since the last visit.  There are no new concerns. Still experiencing nerve pain in area where she had severe shingles outbreak earlier this year. Also c/o dryness and itching of skin at times, though much improved There are no specific complaints      Review of Systems See HPI Denies recent fever or chills. Denies sinus pressure, nasal congestion, ear pain or sore throat. Denies chest congestion, productive cough or wheezing. Denies chest pains, palpitations and leg swelling Denies abdominal pain, nausea, vomiting,diarrhea or constipation.   Denies dysuria, frequency, hesitancy or incontinence. Denies joint pain, swelling and limitation in mobility. Denies headaches, seizures, numbness, or tingling. Denies depression, anxiety or insomnia.       Objective:   Physical Exam Patient alert and oriented and in no cardiopulmonary distress.  HEENT: No facial asymmetry, EOMI, no sinus tenderness,  oropharynx pink and moist.  Neck supple no adenopathy.  Chest: Clear to auscultation bilaterally.  CVS: S1, S2 no murmurs, no S3.  ABD: Soft non tender. Bowel sounds normal.  Ext: No edema  MS: Adequate ROM spine, shoulders, hips and knees.  Skin: Intact, dry and scaly in patches, but much improved  Psych: Good eye contact, normal affect. Memory intact not anxious or depressed appearing.  CNS: CN 2-12 intact, power, tone and sensation normal throughout.         Assessment & Plan:

## 2012-07-28 NOTE — Patient Instructions (Addendum)
F/u in 4.5 month  Please call if you need me before.  Reduce gabapentin to one at bedtime.  Use pure vaseline on skin if itching  Flu vaccine at the pharmacy   Fasting chem 7, cbc in 4.5 month

## 2012-08-10 NOTE — Assessment & Plan Note (Signed)
Stable on current chronic medication

## 2012-08-10 NOTE — Assessment & Plan Note (Signed)
Resolved, skin still dry, pt to use vaseline , as needed

## 2012-08-10 NOTE — Assessment & Plan Note (Signed)
Controlled, no change in medication DASH diet and commitment to daily physical activity for a minimum of 30 minutes discussed and encouraged, as a part of hypertension management. The importance of attaining a healthy weight is also discussed.  

## 2012-08-10 NOTE — Assessment & Plan Note (Signed)
Ongoing neuropathic pain continue gabapentin

## 2012-08-10 NOTE — Assessment & Plan Note (Signed)
Controlled, no change in medication No recent acute pain flare or acute swelling of joints

## 2012-09-08 ENCOUNTER — Other Ambulatory Visit: Payer: Self-pay | Admitting: Family Medicine

## 2012-10-29 ENCOUNTER — Other Ambulatory Visit: Payer: Self-pay

## 2012-10-29 MED ORDER — DILTIAZEM HCL ER COATED BEADS 240 MG PO CP24: ORAL_CAPSULE | ORAL | Status: DC

## 2012-12-10 ENCOUNTER — Telehealth: Payer: Self-pay | Admitting: Family Medicine

## 2012-12-23 NOTE — Telephone Encounter (Signed)
Attempted to return call.  Phone ringing busy.

## 2012-12-29 ENCOUNTER — Ambulatory Visit (INDEPENDENT_AMBULATORY_CARE_PROVIDER_SITE_OTHER): Payer: Medicaid Other | Admitting: Family Medicine

## 2012-12-29 ENCOUNTER — Encounter: Payer: Self-pay | Admitting: Family Medicine

## 2012-12-29 VITALS — BP 152/84 | HR 86 | Resp 18 | Ht 61.5 in | Wt 161.0 lb

## 2012-12-29 DIAGNOSIS — L259 Unspecified contact dermatitis, unspecified cause: Secondary | ICD-10-CM

## 2012-12-29 DIAGNOSIS — F29 Unspecified psychosis not due to a substance or known physiological condition: Secondary | ICD-10-CM

## 2012-12-29 DIAGNOSIS — I10 Essential (primary) hypertension: Secondary | ICD-10-CM

## 2012-12-29 MED ORDER — ASPIRIN EC 81 MG PO TBEC: 81.0000 mg | DELAYED_RELEASE_TABLET | Freq: Every day | ORAL | Status: AC

## 2012-12-29 MED ORDER — TRIAMTERENE-HCTZ 37.5-25 MG PO TABS: 1.0000 | ORAL_TABLET | Freq: Every day | ORAL | Status: DC

## 2012-12-29 NOTE — Assessment & Plan Note (Signed)
Continue med currently taking , stable on this for over 20 years

## 2012-12-29 NOTE — Patient Instructions (Addendum)
F/u early August  Mammogram will be scheduled at checkout, past due  STOP HCTZ and potassium today, start triamterene one daily and continue the cardizem  Stop meloxicam  Please consider the colonoscopy, you need this  Fasting labs July 29, cbc, lipid, cmp and TSH'   Call if legs remain swollen for sooner appointment pls

## 2012-12-29 NOTE — Progress Notes (Signed)
  Subjective:    Patient ID: Julia Stevens, female    DOB: 1946/03/30, 67 y.o.   MRN: [id number]  HPI The PT is here for follow up and re-evaluation of chronic medical conditions, medication management and review of any available recent lab and radiology data.  Preventive health is updated, specifically  Cancer screening and Immunization. Still needs zostavax also colonoscopy, putting off both  3 week h/o increased bilateral leg swelling, denies PND, orthopnea, palpitations or chest pain       Review of Systems See HPI Denies recent fever or chills. Denies sinus pressure, nasal congestion, ear pain or sore throat. Denies chest congestion, productive cough or wheezing.  Denies abdominal pain, nausea, vomiting,diarrhea or constipation.   Denies dysuria, frequency, hesitancy or incontinence. Denies joint pain, swelling and limitation in mobility. Denies headaches, seizures, numbness, or tingling. Denies depression, anxiety or insomnia. Denies skin break down or rash.        Objective:   Physical Exam Patient alert and oriented and in no cardiopulmonary distress.  HEENT: No facial asymmetry, EOMI, no sinus tenderness,  oropharynx pink and moist.  Neck supple no adenopathy.  Chest: Clear to auscultation bilaterally.  CVS: S1, S2 no murmurs, no S3.  ABD: Soft non tender. Bowel sounds normal.  Ext:2 plus pitting edema  MS: Adequate ROM spine, shoulders, hips and knees.  Skin: Intact, no ulcerations or rash noted.  Psych: Good eye contact, normal affect. Memory intact not anxious or depressed appearing.  CNS: CN 2-12 intact, power, tone and sensation normal throughout.        Assessment & Plan:

## 2012-12-29 NOTE — Assessment & Plan Note (Signed)
Marked improvement. 

## 2012-12-29 NOTE — Assessment & Plan Note (Signed)
Uncontrolled, change from HCTZ to maxzide DASH diet and commitment to daily physical activity for a minimum of 30 minutes discussed and encouraged, as a part of hypertension management. The importance of attaining a healthy weight is also discussed.

## 2013-01-08 ENCOUNTER — Other Ambulatory Visit: Payer: Self-pay | Admitting: Family Medicine

## 2013-01-13 ENCOUNTER — Other Ambulatory Visit: Payer: Self-pay | Admitting: Family Medicine

## 2013-01-13 MED ORDER — DILTIAZEM HCL ER COATED BEADS 240 MG PO TB24: 240.0000 mg | ORAL_TABLET | Freq: Every day | ORAL | Status: DC

## 2013-01-15 ENCOUNTER — Telehealth: Payer: Self-pay

## 2013-01-16 NOTE — Telephone Encounter (Signed)
This is because th e [place] is the preferred, encourage her to try to get accustomed to this form, let her know it's the same med, advise tylenol or advil 1 daily for the next 3 days if she has headache, advise her to take med at night to see if this reduces side effect

## 2013-01-19 NOTE — Telephone Encounter (Signed)
Called and left msg for patient to return call.  

## 2013-01-23 NOTE — Telephone Encounter (Signed)
Attempted to reach patient by phone. No answer.

## 2013-01-23 NOTE — Telephone Encounter (Signed)
Attempted to reach multiple times.  Phone line is now busy.  Will await call back from patient.

## 2013-03-02 ENCOUNTER — Telehealth: Payer: Self-pay | Admitting: Family Medicine

## 2013-03-02 NOTE — Telephone Encounter (Signed)
Noted  

## 2013-03-02 NOTE — Telephone Encounter (Signed)
Lab requisition faxed.

## 2013-03-18 ENCOUNTER — Other Ambulatory Visit: Payer: Self-pay | Admitting: Family Medicine

## 2013-03-18 DIAGNOSIS — Z139 Encounter for screening, unspecified: Secondary | ICD-10-CM

## 2013-03-19 LAB — COMPREHENSIVE METABOLIC PANEL
Albumin: 4.3 g/dL (ref 3.5–5.2)
BUN: 9 mg/dL (ref 6–23)
Calcium: 9.2 mg/dL (ref 8.4–10.5)
Chloride: 105 mEq/L (ref 96–112)
Creat: 0.83 mg/dL (ref 0.50–1.10)
Glucose, Bld: 89 mg/dL (ref 70–99)
Potassium: 3.8 mEq/L (ref 3.5–5.3)

## 2013-03-19 LAB — CBC
Platelets: 219 10*3/uL (ref 150–400)
RBC: 4.09 MIL/uL (ref 3.87–5.11)
RDW: 14.4 % (ref 11.5–15.5)
WBC: 5.1 10*3/uL (ref 4.0–10.5)

## 2013-03-19 LAB — LIPID PANEL
Cholesterol: 195 mg/dL (ref 0–200)
HDL: 41 mg/dL (ref >39)
LDL Cholesterol: 130 mg/dL — ABNORMAL HIGH (ref 0–99)
Triglycerides: 121 mg/dL (ref <150)

## 2013-03-20 ENCOUNTER — Ambulatory Visit: Payer: Medicaid Other | Admitting: Family Medicine

## 2013-03-21 ENCOUNTER — Emergency Department (HOSPITAL_COMMUNITY)
Admission: EM | Admit: 2013-03-21 | Discharge: 2013-03-21 | Disposition: A | Discharge status: Home / Self Care | Payer: Medicaid Other | Attending: Emergency Medicine | Admitting: Emergency Medicine

## 2013-03-21 ENCOUNTER — Encounter (HOSPITAL_COMMUNITY): Payer: Self-pay | Admitting: Emergency Medicine

## 2013-03-21 DIAGNOSIS — H9209 Otalgia, unspecified ear: Secondary | ICD-10-CM | POA: Insufficient documentation

## 2013-03-21 DIAGNOSIS — I10 Essential (primary) hypertension: Secondary | ICD-10-CM | POA: Insufficient documentation

## 2013-03-21 DIAGNOSIS — Z8659 Personal history of other mental and behavioral disorders: Secondary | ICD-10-CM | POA: Insufficient documentation

## 2013-03-21 DIAGNOSIS — Z8739 Personal history of other diseases of the musculoskeletal system and connective tissue: Secondary | ICD-10-CM | POA: Insufficient documentation

## 2013-03-21 DIAGNOSIS — IMO0001 Reserved for inherently not codable concepts without codable children: Secondary | ICD-10-CM | POA: Insufficient documentation

## 2013-03-21 DIAGNOSIS — Z872 Personal history of diseases of the skin and subcutaneous tissue: Secondary | ICD-10-CM | POA: Insufficient documentation

## 2013-03-21 DIAGNOSIS — Z8639 Personal history of other endocrine, nutritional and metabolic disease: Secondary | ICD-10-CM | POA: Insufficient documentation

## 2013-03-21 DIAGNOSIS — Z8619 Personal history of other infectious and parasitic diseases: Secondary | ICD-10-CM | POA: Insufficient documentation

## 2013-03-21 DIAGNOSIS — Z7982 Long term (current) use of aspirin: Secondary | ICD-10-CM | POA: Insufficient documentation

## 2013-03-21 DIAGNOSIS — R51 Headache: Secondary | ICD-10-CM | POA: Insufficient documentation

## 2013-03-21 DIAGNOSIS — Z862 Personal history of diseases of the blood and blood-forming organs and certain disorders involving the immune mechanism: Secondary | ICD-10-CM | POA: Insufficient documentation

## 2013-03-21 DIAGNOSIS — M129 Arthropathy, unspecified: Secondary | ICD-10-CM | POA: Insufficient documentation

## 2013-03-21 DIAGNOSIS — J029 Acute pharyngitis, unspecified: Secondary | ICD-10-CM

## 2013-03-21 DIAGNOSIS — Z79899 Other long term (current) drug therapy: Secondary | ICD-10-CM | POA: Insufficient documentation

## 2013-03-21 DIAGNOSIS — Z880 Allergy status to penicillin: Secondary | ICD-10-CM | POA: Insufficient documentation

## 2013-03-21 HISTORY — DX: Zoster without complications: B02.9

## 2013-03-21 LAB — RAPID STREP SCREEN (MED CTR MEBANE ONLY): Streptococcus, Group A Screen (Direct): NEGATIVE

## 2013-03-21 MED ORDER — HYDROCODONE-ACETAMINOPHEN 5-325 MG PO TABS
1.0000 | ORAL_TABLET | Freq: Once | ORAL | Status: AC
  Administered 2013-03-21: 1 via ORAL
  Filled 2013-03-21: qty 1

## 2013-03-21 MED ORDER — CEPHALEXIN 500 MG PO CAPS: 500.0000 mg | ORAL_CAPSULE | Freq: Four times a day (QID) | ORAL | Status: DC

## 2013-03-21 MED ORDER — HYDROCODONE-ACETAMINOPHEN 5-325 MG PO TABS: 1.0000 | ORAL_TABLET | ORAL | Status: DC | PRN

## 2013-03-21 MED ORDER — CEPHALEXIN 500 MG PO CAPS
500.0000 mg | ORAL_CAPSULE | Freq: Once | ORAL | Status: AC
  Administered 2013-03-21: 500 mg via ORAL
  Filled 2013-03-21: qty 1

## 2013-03-21 NOTE — ED Provider Notes (Signed)
Medical screening examination/treatment/procedure(s) were performed by non-physician practitioner and as supervising physician I was immediately available for consultation/collaboration.   Hurman Horn, MD 03/21/13 2014

## 2013-03-21 NOTE — ED Notes (Signed)
Pt c/o ear pain/sore throat and pain when swallowing.

## 2013-03-23 ENCOUNTER — Ambulatory Visit (HOSPITAL_COMMUNITY)
Admission: RE | Admit: 2013-03-23 | Discharge: 2013-03-23 | Disposition: A | Discharge status: Home / Self Care | Payer: Medicaid Other | Source: Ambulatory Visit | Attending: Family Medicine | Admitting: Family Medicine

## 2013-03-23 DIAGNOSIS — Z1231 Encounter for screening mammogram for malignant neoplasm of breast: Secondary | ICD-10-CM | POA: Insufficient documentation

## 2013-03-23 DIAGNOSIS — Z139 Encounter for screening, unspecified: Secondary | ICD-10-CM

## 2013-03-23 LAB — CULTURE, GROUP A STREP

## 2013-03-25 ENCOUNTER — Telehealth: Payer: Self-pay | Admitting: Family Medicine

## 2013-03-25 NOTE — Telephone Encounter (Signed)
She has an appointment here for 8.13.2014 and told Darel Hong that she would just wait till then call patient and speak with her

## 2013-03-26 ENCOUNTER — Other Ambulatory Visit: Payer: Self-pay | Admitting: Family Medicine

## 2013-03-26 DIAGNOSIS — R928 Other abnormal and inconclusive findings on diagnostic imaging of breast: Secondary | ICD-10-CM

## 2013-03-26 NOTE — Telephone Encounter (Signed)
The breast center calls the patient to schedule an appt at [facility]. I called the breast center and left a message to call me back to make sure they have contacted the patient

## 2013-03-27 NOTE — Telephone Encounter (Signed)
Pt has been scheduled.  °

## 2013-03-29 NOTE — ED Provider Notes (Signed)
CSN: [id number]     Arrival date & time 03/21/13  1152 History     First MD Initiated Contact with Patient 03/21/13 1245     Chief Complaint  Patient presents with  . Sore Throat  . Otalgia   (Consider location/radiation/quality/duration/timing/severity/associated sxs/prior Treatment) Patient is a 67 y.o. female presenting with pharyngitis and ear pain. The history is provided by the patient.  Sore Throat This is a new problem. The current episode started in the past 7 days. The problem has been gradually worsening. Associated symptoms include headaches, myalgias and a sore throat. Pertinent negatives include no abdominal pain, arthralgias, chest pain, coughing, neck pain or rash. Associated symptoms comments: Ear pain. The symptoms are aggravated by swallowing. She has tried acetaminophen and rest for the symptoms. The treatment provided no relief.  Otalgia Associated symptoms: headaches and sore throat   Associated symptoms: no abdominal pain, no cough, no neck pain and no rash     Past Medical History  Diagnosis Date  . Dysphagia   . Hyperlipidemia   . Eczema   . Psychosis   . Hypertension   . Scleroderma   . Arthritis   . Shingles    Past Surgical History  Procedure Laterality Date  . Total abdominal hysterectomy w/ bilateral salpingoophorectomy  1994  . Abdominal hysterectomy     Family History  Problem Relation Age of Onset  . Hypertension Mother   . Heart disease Mother     CAD  . Hypertension Father      Severe DJD/ macrobacterium  . Hypertension Sister   . Hypertension Sister   . Fibromyalgia Sister     infection  . Diabetes Brother    History  Substance Use Topics  . Smoking status: Never Smoker   . Smokeless tobacco: Not on file  . Alcohol Use: No   OB History   Grav Para Term Preterm Abortions TAB SAB Ect Mult Living                 Review of Systems  Constitutional: Negative for activity change.       All ROS Neg except as noted in HPI  HENT:  Positive for ear pain and sore throat. Negative for nosebleeds and neck pain.   Eyes: Negative for photophobia and discharge.  Respiratory: Negative for cough, shortness of breath and wheezing.   Cardiovascular: Negative for chest pain and palpitations.  Gastrointestinal: Negative for abdominal pain and blood in stool.  Genitourinary: Negative for dysuria, frequency and hematuria.  Musculoskeletal: Positive for myalgias. Negative for back pain and arthralgias.  Skin: Negative.  Negative for rash.  Neurological: Positive for headaches. Negative for dizziness, seizures and speech difficulty.  Psychiatric/Behavioral: Negative for hallucinations and confusion.    Allergies  Ciprofloxacin and Penicillins  Home Medications   Current Outpatient Rx  Name  Route  Sig  Dispense  Refill  . aspirin EC 81 MG tablet   Oral   Take 1 tablet (81 mg total) by mouth daily.   150 tablet   2   . calcium carbonate (OS-CAL) 600 MG TABS   Oral   Take 600 mg by mouth daily.           . cephALEXin (KEFLEX) 500 MG capsule   Oral   Take 1 capsule (500 mg total) by mouth 4 (four) times daily.   28 capsule   0   . diltiazem (CARDIZEM LA) 240 MG 24 hr tablet   Oral   Take  1 tablet (240 mg total) by mouth daily.   30 tablet   11     Dis[pense cardizem CD  As the preferred agent PLEA ...   . HYDROcodone-acetaminophen (NORCO/VICODIN) 5-325 MG per tablet   Oral   Take 1 tablet by mouth every 4 (four) hours as needed for pain.   15 tablet   0   . Multiple Vitamin (MULTIVITAMIN) capsule   Oral   Take 1 capsule by mouth daily.           . nortriptyline (PAMELOR) 50 MG capsule      TAKE 2 CAPSULES BY MOUTH AT BEDTIME.   60 capsule   3   . triamterene-hydrochlorothiazide (MAXZIDE-25) 37.5-25 MG per tablet   Oral   Take 1 tablet by mouth daily.   30 tablet   11     Discontinue HCTZ and potassium effective 05/12/201 ...    BP 165/99  Pulse 95  Temp(Src) 98.8 F (37.1 C)  Resp 18   Ht 5\' 1"  (1.549 m)  Wt 155 lb (70.308 kg)  BMI 29.3 kg/m2  SpO2 96% Physical Exam  Nursing note and vitals reviewed. Constitutional: She is oriented to person, place, and time. She appears well-developed and well-nourished.  Non-toxic appearance.  HENT:  Head: Normocephalic.  Right Ear: Tympanic membrane and external ear normal.  Left Ear: Tympanic membrane and external ear normal.  Mouth/Throat: Mucous membranes are normal. Edematous present. Posterior oropharyngeal erythema present. No oropharyngeal exudate or tonsillar abscesses.  Eyes: EOM and lids are normal. Pupils are equal, round, and reactive to light.  Neck: Normal range of motion. Neck supple. Carotid bruit is not present.  Cardiovascular: Normal rate, regular rhythm, normal heart sounds, intact distal pulses and normal pulses.   Pulmonary/Chest: Breath sounds normal. No respiratory distress.  Abdominal: Soft. Bowel sounds are normal. There is no tenderness. There is no guarding.  Musculoskeletal: Normal range of motion.  Lymphadenopathy:       Head (right side): No submandibular adenopathy present.       Head (left side): No submandibular adenopathy present.    She has no cervical adenopathy.  Neurological: She is alert and oriented to person, place, and time. She has normal strength. No cranial nerve deficit or sensory deficit.  Skin: Skin is warm and dry.  Psychiatric: She has a normal mood and affect. Her speech is normal.    ED Course   Procedures (including critical care time)  Labs Reviewed  RAPID STREP SCREEN  CULTURE, GROUP A STREP   No results found. 1. Pharyngitis     MDM  **I have reviewed nursing notes, vital signs, and all appropriate lab and imaging results for this patient.* Vital signs wnl. Pulse ox 96% on room air. WNL by my interpretation. No evidence for abscess. Strep test is negative. Family reports hx of strep at home and hx of previous neg rapid strep test with positive strep presentation 2  to 3 days later. Family strongly request antibiotics. Ordered keflex and norco for the patient. Pt to see pcp or return to the ED if not improving.  Kathie Dike, PA-C 03/29/13 1620

## 2013-04-01 ENCOUNTER — Ambulatory Visit: Payer: Medicaid Other | Admitting: Family Medicine

## 2013-04-08 ENCOUNTER — Encounter (HOSPITAL_COMMUNITY): Payer: Medicaid Other

## 2013-04-12 NOTE — ED Provider Notes (Signed)
Medical screening examination/treatment/procedure(s) were performed by non-physician practitioner and as supervising physician I was immediately available for consultation/collaboration.  Hurman Horn, MD 04/12/13 [phone]

## 2013-04-15 ENCOUNTER — Ambulatory Visit (HOSPITAL_COMMUNITY)
Admission: RE | Admit: 2013-04-15 | Discharge: 2013-04-15 | Disposition: A | Discharge status: Home / Self Care | Payer: Medicaid Other | Source: Ambulatory Visit | Attending: Family Medicine | Admitting: Family Medicine

## 2013-04-15 ENCOUNTER — Other Ambulatory Visit: Payer: Self-pay | Admitting: Family Medicine

## 2013-04-15 DIAGNOSIS — R928 Other abnormal and inconclusive findings on diagnostic imaging of breast: Secondary | ICD-10-CM | POA: Insufficient documentation

## 2013-05-12 ENCOUNTER — Other Ambulatory Visit: Payer: Self-pay | Admitting: Family Medicine

## 2013-05-18 ENCOUNTER — Ambulatory Visit: Payer: Medicaid Other | Admitting: Family Medicine

## 2013-05-20 ENCOUNTER — Encounter: Payer: Self-pay | Admitting: Family Medicine

## 2013-05-20 ENCOUNTER — Ambulatory Visit (INDEPENDENT_AMBULATORY_CARE_PROVIDER_SITE_OTHER): Payer: Medicare Other | Admitting: Family Medicine

## 2013-05-20 VITALS — BP 170/108 | HR 83 | Resp 16 | Wt 147.8 lb

## 2013-05-20 DIAGNOSIS — R5381 Other malaise: Secondary | ICD-10-CM

## 2013-05-20 DIAGNOSIS — Z23 Encounter for immunization: Secondary | ICD-10-CM

## 2013-05-20 DIAGNOSIS — K3189 Other diseases of stomach and duodenum: Secondary | ICD-10-CM

## 2013-05-20 DIAGNOSIS — R1013 Epigastric pain: Secondary | ICD-10-CM

## 2013-05-20 DIAGNOSIS — F29 Unspecified psychosis not due to a substance or known physiological condition: Secondary | ICD-10-CM | POA: Diagnosis not present

## 2013-05-20 DIAGNOSIS — I10 Essential (primary) hypertension: Secondary | ICD-10-CM

## 2013-05-20 DIAGNOSIS — T300 Burn of unspecified body region, unspecified degree: Secondary | ICD-10-CM

## 2013-05-20 MED ORDER — NORTRIPTYLINE HCL 50 MG PO CAPS: ORAL_CAPSULE | ORAL | Status: DC

## 2013-05-20 MED ORDER — HYDROCHLOROTHIAZIDE 25 MG PO TABS: 25.0000 mg | ORAL_TABLET | Freq: Every day | ORAL | Status: DC

## 2013-05-20 MED ORDER — POTASSIUM CHLORIDE CRYS ER 20 MEQ PO TBCR: EXTENDED_RELEASE_TABLET | ORAL | Status: DC

## 2013-05-20 NOTE — Patient Instructions (Addendum)
F/u in 5 weeks, call if you need me before  Your blood pressure is extremely high, you need to take the cardizem and I have sent in the HCTZ and potassium. Take one cardizem tablet inhere today before you leave and start the other 2 today as soon as you get them please  Flu and pneumonia vaccine (if covered 0 in office today)   The heartburn you have uis likely from the ibuprofen you have been taking in excess for your headache , please stop this, can use OTC zantac for rellief if needed. If this persists then I will need to have a GI Doctor check you  Chem 7 and H pylori in 4.5 weeks , before visit please

## 2013-05-24 DIAGNOSIS — T300 Burn of unspecified body region, unspecified degree: Secondary | ICD-10-CM | POA: Insufficient documentation

## 2013-05-24 NOTE — Assessment & Plan Note (Signed)
Burn to thigh 2 weeks ago, no purulent drainage, has been applying silvadene and it is healing well

## 2013-05-24 NOTE — Assessment & Plan Note (Signed)
Uncontrolled due to non compliance, pt stopped meds thought they were hurting her esp new formulation of the cardizem, education done and she will start meds today as prescribed

## 2013-05-24 NOTE — Progress Notes (Signed)
  Subjective:    Patient ID: Julia Stevens, female    DOB: May 18, 1946, 67 y.o.   MRN: [id number]  HPIne Pt in for review of uncontrolled hypertension in particular. Unfortunately, she remains non compliant with medication and her value is still elevated. States thinks the new form of cardizem is causing headaches so she stopped it. Had not been taking the HCTZ either. Denies chest pain, light headedness, leg swelling or excess fatigue or palpitations. Accidentally burned her thigh 2 weeks ago, using silvadene with good result, no h/o drainage or fever Needs immunization coverage for the zostavax and pneumonia vaccine unclear hence both are deferred  Review of Systems See HPI Denies recent fever or chills. Denies sinus pressure, nasal congestion, ear pain or sore throat. Denies chest congestion, productive cough or wheezing. Denies chest pains, palpitations and leg swelling Denies abdominal pain, nausea, vomiting,diarrhea or constipation.   Denies dysuria, frequency, hesitancy or incontinence. Denies joint pain, swelling and limitation in mobility. Denies headaches, seizures, numbness, or tingling. Denies depression, anxiety or insomnia.         Objective:   Physical Exam Patient alert and oriented and in no cardiopulmonary distress.  HEENT: No facial asymmetry, EOMI, no sinus tenderness,  oropharynx pink and moist.  Neck supple no adenopathy.  Chest: Clear to auscultation bilaterally.  CVS: S1, S2 no murmurs, no S3.  ABD: Soft non tender. Bowel sounds normal.  Ext: No edema  MS: Adequate ROM spine, shoulders, hips and knees.  Skin: IBurn to anterior thigh, healing skin pink , no drainage  Psych: Good eye contact, normal affect. Memory intact not anxious or depressed appearing.  CNS: CN 2-12 intact, power, tone and sensation normal throughout.        Assessment & Plan:

## 2013-05-24 NOTE — Assessment & Plan Note (Signed)
Stable on chronic med

## 2013-06-23 LAB — BASIC METABOLIC PANEL
BUN: 9 mg/dL (ref 6–23)
CO2: 27 mEq/L (ref 19–32)
Glucose, Bld: 80 mg/dL (ref 70–99)
Potassium: 3.9 mEq/L (ref 3.5–5.3)
Sodium: 139 mEq/L (ref 135–145)

## 2013-06-24 ENCOUNTER — Ambulatory Visit (INDEPENDENT_AMBULATORY_CARE_PROVIDER_SITE_OTHER): Payer: Medicaid Other | Admitting: Family Medicine

## 2013-06-24 ENCOUNTER — Encounter (INDEPENDENT_AMBULATORY_CARE_PROVIDER_SITE_OTHER): Payer: Self-pay

## 2013-06-24 ENCOUNTER — Encounter: Payer: Self-pay | Admitting: Family Medicine

## 2013-06-24 VITALS — BP 140/86 | HR 83 | Resp 18 | Ht 61.5 in | Wt 148.1 lb

## 2013-06-24 DIAGNOSIS — I10 Essential (primary) hypertension: Secondary | ICD-10-CM

## 2013-06-24 DIAGNOSIS — F29 Unspecified psychosis not due to a substance or known physiological condition: Secondary | ICD-10-CM

## 2013-06-24 MED ORDER — BENAZEPRIL HCL 20 MG PO TABS: 20.0000 mg | ORAL_TABLET | Freq: Every day | ORAL | Status: DC

## 2013-06-24 NOTE — Patient Instructions (Signed)
F/u in early January for blood pressure, call if you need me before  STOP HCTZ and potassium due to side effect  New additional blood pressure medication is benazepril 20mg  one daily, continue cardizem as before

## 2013-06-24 NOTE — Progress Notes (Signed)
  Subjective:    Patient ID: Julia Stevens, female    DOB: April 02, 1946, 67 y.o.   MRN: [id number]  HPI Pt in for BP re evaluation.  Has been exepriencing excessive urination on hCTZ which she is unable to tolerate. Denies light headedness or cramps   Review of Systems See HPI Denies recent fever or chills. Denies sinus pressure, nasal congestion, ear pain or sore throat. Denies chest congestion, productive cough or wheezing. Denies chest pains, palpitations and leg swelling  Denies skin break down or rash.        Objective:   Physical Exam Patient alert and oriented and in no cardiopulmonary distress.  HEENT: No facial asymmetry, EOMI, no sinus tenderness,  oropharynx pink and moist.  Neck supple no adenopathy.  Chest: Clear to auscultation bilaterally.  CVS: S1, S2 no murmurs, no S3.  ABD: Soft non tender.   Ext: No edema  CNS: CN 2-12 intact, non focal.        Assessment & Plan:

## 2013-06-26 NOTE — Assessment & Plan Note (Signed)
Improved, but due to urinary frequency, pt intolerant of HCTZ, will d/c this and start benazepril DASH diet and commitment to daily physical activity for a minimum of 30 minutes discussed and encouraged, as a part of hypertension management. The importance of attaining a healthy weight is also discussed.

## 2013-06-26 NOTE — Assessment & Plan Note (Signed)
Controlled, no change in medication  

## 2013-07-20 ENCOUNTER — Telehealth: Payer: Self-pay | Admitting: Family Medicine

## 2013-07-20 NOTE — Telephone Encounter (Signed)
Wanted to know about her last labs. Patient aware they were normal

## 2013-07-20 NOTE — Telephone Encounter (Signed)
Tried to call patient back x 2. Line busy

## 2013-08-21 ENCOUNTER — Other Ambulatory Visit: Payer: Self-pay

## 2013-08-21 ENCOUNTER — Ambulatory Visit (INDEPENDENT_AMBULATORY_CARE_PROVIDER_SITE_OTHER): Payer: Medicare Other | Admitting: Family Medicine

## 2013-08-21 ENCOUNTER — Encounter: Payer: Self-pay | Admitting: Family Medicine

## 2013-08-21 ENCOUNTER — Encounter (INDEPENDENT_AMBULATORY_CARE_PROVIDER_SITE_OTHER): Payer: Self-pay

## 2013-08-21 VITALS — BP 148/90 | HR 80 | Resp 16 | Ht 61.5 in | Wt 152.0 lb

## 2013-08-21 DIAGNOSIS — I10 Essential (primary) hypertension: Secondary | ICD-10-CM | POA: Diagnosis not present

## 2013-08-21 DIAGNOSIS — Z23 Encounter for immunization: Secondary | ICD-10-CM | POA: Diagnosis not present

## 2013-08-21 DIAGNOSIS — F29 Unspecified psychosis not due to a substance or known physiological condition: Secondary | ICD-10-CM

## 2013-08-21 MED ORDER — TRIAMTERENE-HCTZ 37.5-25 MG PO TABS: 1.0000 | ORAL_TABLET | Freq: Every day | ORAL | Status: DC

## 2013-08-21 MED ORDER — NORTRIPTYLINE HCL 50 MG PO CAPS
ORAL_CAPSULE | ORAL | Status: DC
Start: 2013-08-21 — End: 2014-01-15

## 2013-08-21 NOTE — Progress Notes (Signed)
   Subjective:    Patient ID: Julia Stevens, female    DOB: [birth year], 68 y.o.   MRN: [id number]  HPI Pt in for BP re evaluation. C/o dry cough and tickle in her throat, sometimes feeling as though throat is closing since starting the ACE. No other concerns   Review of Systems See HPI Denies recent fever or chills. Denies sinus pressure, nasal congestion, ear pain or sore throat. Denies chest congestion, productive cough or wheezing. Denies chest pains, palpitations and leg swelling Chronic joint swelling of rheumatoid arthritis unchanged and not currently painful        Objective:   Physical Exam Patient alert and oriented and in no cardiopulmonary distress.  HEENT: No facial asymmetry, EOMI, no sinus tenderness,  oropharynx pink and moist.  Neck supple no adenopathy.  Chest: Clear to auscultation bilaterally.  CVS: S1, S2 no murmurs, no S3.  ABD: Soft non tender. Bowel sounds normal.  Ext: No edema        Assessment & Plan:

## 2013-08-21 NOTE — Patient Instructions (Addendum)
Welcome to medicare visit in 2 month, call if you need me before.  Stop benazerpril, allergic cough, new additional medication for blood pressure is maxzide 25mg  one daily  Pneumonia vaccine today  You will be referred for colonoscopy for Spring appt per your request at next visit   Pls get the shingles vaccine at your pharmacy, you need this  Chem 7 , non fast on day of next visit

## 2013-08-22 NOTE — Assessment & Plan Note (Signed)
Uncontrolled and allergic reaction to ACE, will change to maxzide. DASH diet and commitment to daily physical activity for a minimum of 30 minutes discussed and encouraged, as a part of hypertension management. The importance of attaining a healthy weight is also discussed.

## 2013-08-22 NOTE — Assessment & Plan Note (Signed)
Maintained and stable on current medication

## 2013-10-23 ENCOUNTER — Encounter: Payer: Medicare Other | Admitting: Family Medicine

## 2013-10-26 ENCOUNTER — Ambulatory Visit (INDEPENDENT_AMBULATORY_CARE_PROVIDER_SITE_OTHER): Payer: Medicare Other | Admitting: Family Medicine

## 2013-10-26 ENCOUNTER — Encounter (INDEPENDENT_AMBULATORY_CARE_PROVIDER_SITE_OTHER): Payer: Self-pay

## 2013-10-26 ENCOUNTER — Encounter: Payer: Self-pay | Admitting: Family Medicine

## 2013-10-26 VITALS — BP 112/82 | HR 85 | Resp 16 | Ht 61.5 in | Wt 146.4 lb

## 2013-10-26 DIAGNOSIS — Z0000 Encounter for general adult medical examination without abnormal findings: Secondary | ICD-10-CM | POA: Diagnosis not present

## 2013-10-26 DIAGNOSIS — H547 Unspecified visual loss: Secondary | ICD-10-CM

## 2013-10-26 DIAGNOSIS — I10 Essential (primary) hypertension: Secondary | ICD-10-CM | POA: Diagnosis not present

## 2013-10-26 DIAGNOSIS — Z139 Encounter for screening, unspecified: Secondary | ICD-10-CM

## 2013-10-26 DIAGNOSIS — Z1211 Encounter for screening for malignant neoplasm of colon: Secondary | ICD-10-CM

## 2013-10-26 DIAGNOSIS — Z13820 Encounter for screening for osteoporosis: Secondary | ICD-10-CM

## 2013-10-26 LAB — POCT URINALYSIS DIPSTICK
BILIRUBIN UA: NEGATIVE
Blood, UA: NEGATIVE
GLUCOSE UA: NEGATIVE
KETONES UA: NEGATIVE
Leukocytes, UA: NEGATIVE
Nitrite, UA: NEGATIVE
Protein, UA: NEGATIVE
SPEC GRAV UA: 1.02
Urobilinogen, UA: 0.2
pH, UA: 7

## 2013-10-26 NOTE — Patient Instructions (Signed)
F/u in 4.5 month, call if you need me before  No changes in medication.  You are referred for screening colonoscopy and for bone density scan  Consider seniors group   Labs today  Be careful not to fall!  Fall Prevention and Home Safety Falls cause injuries and can affect all age groups. It is possible to prevent falls.  HOW TO PREVENT FALLS  Wear shoes with rubber soles that do not have an opening for your toes.  Keep the inside and outside of your house well lit.  Use night lights throughout your home.  Remove clutter from floors.  Clean up floor spills.  Remove throw rugs or fasten them to the floor with carpet tape.  Do not place electrical cords across pathways.  Put grab bars by your tub, shower, and toilet. Do not use towel bars as grab bars.  Put handrails on both sides of the stairway. Fix loose handrails.  Do not climb on stools or stepladders, if possible.  Do not wax your floors.  Repair uneven or unsafe sidewalks, walkways, or stairs.  Keep items you use a lot within reach.  Be aware of pets.  Keep emergency numbers next to the telephone.  Put smoke detectors in your home and near bedrooms. Ask your doctor what other things you can do to prevent falls. Document Released: 06/02/2009 Document Revised: 02/05/2012 Document Reviewed: 11/06/2011 [facility] Patient Information 2014 [place], [place].

## 2013-10-26 NOTE — Progress Notes (Signed)
Subjective:    Patient ID: Julia Stevens, female    DOB: [birth year], 68 y.o.   MRN: [id number]  HPI Preventive Screening-Counseling & Management   Patient present here today for a Medicare annual wellness visit.   Current Problems (verified)   Medications Prior to Visit Allergies (verified)   PAST HISTORY  Family History: noted elsewhere  Social History : Single, never married, no children, 1 brother lives with her. No tobacco, alcohol or nicotine use   Risk Factors  Current exercise habits:  None regularly except for movement associated with ADL's, pt to start daily Physical activity for health  Dietary issues discussed:Heart healthy , low carb diet, rich in vegetable and fruit fresh or frozen   Cardiac risk factors: None signifiacant  Depression Screen  (Note: if answer to either of the following is "Yes", a more complete depression screening is indicated)   Over the past two weeks, have you felt down, depressed or hopeless? No  Over the past two weeks, have you felt little interest or pleasure in doing things? No  Have you lost interest or pleasure in daily life? No  Do you often feel hopeless? No  Do you cry easily over simple problems? No   Activities of Daily Living  In your present state of health, do you have any difficulty performing the following activities?  Driving?: No Managing money?: No Feeding yourself?:No Getting from bed to chair?:No Climbing a flight of stairs?:No Preparing food and eating?:No Bathing or showering?:No Getting dressed?:No Getting to the toilet?:No Using the toilet?:No Moving around from place to place?: No  Fall Risk Assessment In the past year have you fallen or had a near fall?:yes, near fall in 2015, slid on water Are you currently taking any medications that make you dizzy?:No   Hearing Difficulties: No Do you often ask people to speak up or repeat themselves?:No Do you experience ringing or noises in your  ears?:No Do you have difficulty understanding soft or whispered voices?:No  Cognitive Testing  Alert? Yes Normal Appearance?Yes  Oriented to person? Yes Place? Yes  Time? Yes  Displays appropriate judgment?Yes  Can read the correct time from a watch face? yes Are you having problems remembering things?No  Advanced Directives have been discussed with the patient?Yes , full code   List the Names of Other Physician/Practitioners you currently use: none   Indicate any recent Medical Services you may have received from other than [facility] providers in the past year (date may be approximate).   Assessment:    Annual Wellness Exam   Plan:    During the course of the visit the patient was educated and counseled about appropriate screening and preventive services including:  A healthy diet is rich in fruit, vegetables and whole grains. Poultry fish, nuts and beans are a healthy choice for protein rather then red meat. A low sodium diet and drinking 64 ounces of water daily is generally recommended. Oils and sweet should be limited. Carbohydrates especially for those who are diabetic or overweight, should be limited to 30-45 gram per meal. It is important to eat on a regular schedule, at least 3 times daily. Snacks should be primarily fruits, vegetables or nuts. It is important that you exercise regularly at least 30 minutes 5 times a week. If you develop chest pain, have severe difficulty breathing, or feel very tired, stop exercising immediately and seek medical attention  Immunization reviewed and updated. Cancer screening reviewed and updated    Patient Instructions (the written  plan) was given to the patient.  Medicare Attestation  I have personally reviewed:  The patient's medical and social history  Their use of alcohol, tobacco or illicit drugs  Their current medications and supplements  The patient's functional ability including ADLs,fall risks, home safety risks, cognitive, and  hearing and visual impairment  Diet and physical activities  Evidence for depression or mood disorders  The patient's weight, height, BMI, and visual acuity have been recorded in the chart. I have made referrals, counseling, and provided education to the patient based on review of the above and I have provided the patient with a written personalized care plan for preventive services.      Review of Systems     Objective:   Physical Exam        Assessment & Plan:

## 2013-10-27 DIAGNOSIS — H547 Unspecified visual loss: Secondary | ICD-10-CM | POA: Insufficient documentation

## 2013-10-27 DIAGNOSIS — Z0000 Encounter for general adult medical examination without abnormal findings: Secondary | ICD-10-CM | POA: Insufficient documentation

## 2013-10-27 LAB — BASIC METABOLIC PANEL
BUN: 16 mg/dL (ref 6–23)
CHLORIDE: 99 meq/L (ref 96–112)
CO2: 31 mEq/L (ref 19–32)
Calcium: 9.3 mg/dL (ref 8.4–10.5)
Creat: 1.04 mg/dL (ref 0.50–1.10)
Glucose, Bld: 85 mg/dL (ref 70–99)
POTASSIUM: 3.5 meq/L (ref 3.5–5.3)
SODIUM: 139 meq/L (ref 135–145)

## 2013-10-27 NOTE — Assessment & Plan Note (Signed)
Pt with 20/200 vision , needs eye exam  and treatment  referral made

## 2013-10-27 NOTE — Assessment & Plan Note (Signed)
Welcome to [facility] as documented. Counseling done  re healthy lifestyle involving commitment to 150 minutes exercise per week, heart healthy diet, and attaining healthy weight.The importance of adequate sleep also discussed. Regular seat belt use and safe storage  of firearms if patient has them, is also discussed. Changes in health habits are decided on by the patient with goals and time frames  set for achieving them. Immunization and cancer screening needs are specifically addressed at this visit. Fall risk reduction is discussed. EKG and urinalysis obtained, pt has h/o hypertension for over 15 years. EKG shows normal sinus rythm with no ischemia, urinalysis is negative for protein and sugar , no infection present on UA

## 2013-11-03 ENCOUNTER — Telehealth: Payer: Self-pay

## 2013-11-03 NOTE — Telephone Encounter (Signed)
Pt called to set up her tcs. She said that she wasn't having any problems and Dr [name] did her last one years ago. Please call her at [phone]

## 2013-11-09 ENCOUNTER — Other Ambulatory Visit (HOSPITAL_COMMUNITY): Payer: Medicare Other

## 2013-11-13 ENCOUNTER — Ambulatory Visit (HOSPITAL_COMMUNITY)
Admission: RE | Admit: 2013-11-13 | Discharge: 2013-11-13 | Disposition: A | Discharge status: Home / Self Care | Payer: Medicare Other | Source: Ambulatory Visit | Attending: Family Medicine | Admitting: Family Medicine

## 2013-11-13 DIAGNOSIS — Z780 Asymptomatic menopausal state: Secondary | ICD-10-CM | POA: Insufficient documentation

## 2013-11-13 DIAGNOSIS — Z13820 Encounter for screening for osteoporosis: Secondary | ICD-10-CM | POA: Diagnosis not present

## 2013-11-13 DIAGNOSIS — M069 Rheumatoid arthritis, unspecified: Secondary | ICD-10-CM | POA: Insufficient documentation

## 2013-11-16 ENCOUNTER — Telehealth: Payer: Self-pay

## 2013-11-16 NOTE — Telephone Encounter (Signed)
PT is scheduled for an OV with Neil Crouch, PA on 12/07/2013 at 10:30 due to constipation.

## 2013-11-16 NOTE — Telephone Encounter (Signed)
Opened in error

## 2013-12-02 ENCOUNTER — Telehealth: Payer: Self-pay

## 2013-12-02 NOTE — Telephone Encounter (Addendum)
Gastroenterology Pre-Procedure Review  Request Date: Requesting Physician: Moshe Cipro  PATIENT REVIEW QUESTIONS: The patient responded to the following health history questions as indicated:    1. Diabetes Melitis: NO 2. Joint replacements in the past 12 months: NO 3. Major health problems in the past 3 months: NO 4. Has an artificial valve or MVP: NO 5. Has a defibrillator: NO 6. Has been advised in past to take antibiotics in advance of a procedure like teeth cleaning: NO 7. Acholic: NO 8. Family history colon cancer: NO    MEDICATIONS & ALLERGIES:     Patient reports the following regarding taking any blood thinners:   Plavix? NO Aspirin? YES Coumadin? NO  Patient confirms/reports the following medications:  Current Outpatient Prescriptions  Medication Sig Dispense Refill  . aspirin EC 81 MG tablet Take 1 tablet (81 mg total) by mouth daily.  150 tablet  2  . calcium carbonate (OS-CAL) 600 MG TABS Take 600 mg by mouth daily.        Marland Kitchen diltiazem (CARDIZEM LA) 240 MG 24 hr tablet Take 1 tablet (240 mg total) by mouth daily.  30 tablet  11  . Multiple Vitamin (MULTIVITAMIN) capsule Take 1 capsule by mouth daily.        . nortriptyline (PAMELOR) 50 MG capsule TAKE 2 CAPSULES BY MOUTH AT BEDTIME.  60 capsule  3   No current facility-administered medications for this visit.    Patient confirms/reports the following allergies:  Allergies  Allergen Reactions  . Ace Inhibitors Cough  . Ciprofloxacin   . Penicillins     No orders of the defined types were placed in this encounter.    AUTHORIZATION INFORMATION Primary Insurance:Medicaid   ID #: [id number]  Group #:  Pre-Cert / Josem Kaufmann required: Pre-Cert / Auth #:   Secondary Insurance: Medicare  ID #: [id number]  Group #:  Pre-Cert / Auth required:  Pre-Cert / Auth #:   SCHEDULE INFORMATION: Procedure has been scheduled as follows:  Date:  Time:  Location:   This Gastroenterology Pre-Precedure Review Form is being routed  to the following provider(s):   Pt would like to have the TCS on Mon,Wed,Fri at 10:00 because transpiration will not pick her up before 9:00 am.

## 2013-12-07 ENCOUNTER — Encounter (INDEPENDENT_AMBULATORY_CARE_PROVIDER_SITE_OTHER): Payer: Self-pay

## 2013-12-07 ENCOUNTER — Ambulatory Visit: Payer: Medicare Other | Admitting: Gastroenterology

## 2013-12-07 ENCOUNTER — Encounter: Payer: Self-pay | Admitting: Gastroenterology

## 2013-12-07 ENCOUNTER — Ambulatory Visit (INDEPENDENT_AMBULATORY_CARE_PROVIDER_SITE_OTHER): Payer: Medicare Other | Admitting: Gastroenterology

## 2013-12-07 VITALS — BP 119/80 | HR 88 | Temp 97.8°F | Ht 63.0 in | Wt 147.8 lb

## 2013-12-07 DIAGNOSIS — K5900 Constipation, unspecified: Secondary | ICD-10-CM | POA: Diagnosis not present

## 2013-12-07 MED ORDER — POLYETHYLENE GLYCOL 3350 17 GM/SCOOP PO POWD: ORAL | Status: DC

## 2013-12-07 MED ORDER — PEG 3350-KCL-NA BICARB-NACL 420 G PO SOLR: 4000.0000 mL | ORAL | Status: DC

## 2013-12-07 NOTE — Progress Notes (Signed)
Primary Care Physician:  Tula Nakayama, MD  Primary Gastroenterologist:  Barney Drain, MD   Chief Complaint  Patient presents with  . Colonoscopy    HPI:  Julia Stevens is a 68 y.o. female here to schedule colonoscopy. Her last colonoscopy was by Dr. Irving Shows in 2001 and was normal. She complains of occasional constipation. Has some pain in right inguinal area if has to strain. Does not happen all the time. Prune juice as needed. Activity helps with constipation as well. Avoids Dulcolax because of diarrhea. Currently having a bowel movement about every other day. No melena, brbpr. No abdominal pain. No heartburn, n/v, dysphagia. No weight loss.    Current Outpatient Prescriptions  Medication Sig Dispense Refill  . aspirin EC 81 MG tablet Take 1 tablet (81 mg total) by mouth daily.  150 tablet  2  . calcium carbonate (OS-CAL) 600 MG TABS Take 600 mg by mouth daily.        Marland Kitchen diltiazem (CARDIZEM LA) 240 MG 24 hr tablet Take 1 tablet (240 mg total) by mouth daily.  30 tablet  11  . Multiple Vitamin (MULTIVITAMIN) capsule Take 1 capsule by mouth daily.        . nortriptyline (PAMELOR) 50 MG capsule TAKE 2 CAPSULES BY MOUTH AT BEDTIME.  60 capsule  3  . triamterene-hydrochlorothiazide (MAXZIDE-25) 37.5-25 MG per tablet Take 1 tablet by mouth daily.       No current facility-administered medications for this visit.    Allergies as of 12/07/2013 - Review Complete 12/07/2013  Allergen Reaction Noted  . Ace inhibitors Cough 08/21/2013  . Ciprofloxacin  03/22/2008  . Penicillins  12/26/2007    Past Medical History  Diagnosis Date  . Dysphagia   . Hyperlipidemia   . Eczema   . Psychosis   . Hypertension   . Scleroderma   . Arthritis   . Shingles     Past Surgical History  Procedure Laterality Date  . Total abdominal hysterectomy w/ bilateral salpingoophorectomy  1994  . Colonoscopy  06/26/2000    Julia Stevens,     Family History  Problem Relation Age of Onset  . Hypertension  Mother   . Heart disease Mother     CAD  . Hypertension Father      Severe DJD/ macrobacterium  . Hypertension Sister   . Hypertension Sister   . Fibromyalgia Sister     infection  . Diabetes Brother   . Colon cancer Neg Hx     History   Social History  . Marital Status: Single    Spouse Name: N/A    Number of Children: 0  . Years of Education: N/A   Occupational History  . disabled    .     Social History Main Topics  . Smoking status: Never Smoker   . Smokeless tobacco: Not on file  . Alcohol Use: No  . Drug Use: No  . Sexual Activity: Not Currently    Birth Control/ Protection: Surgical   Other Topics Concern  . Not on file   Social History Narrative  . No narrative on file      ROS:  General: Negative for anorexia, weight loss, fever, chills, fatigue, weakness. Eyes: Negative for vision changes.  ENT: Negative for hoarseness, difficulty swallowing , nasal congestion. CV: Negative for chest pain, angina, palpitations, dyspnea on exertion, peripheral edema.  Respiratory: Negative for dyspnea at rest, dyspnea on exertion, cough, sputum, wheezing.  GI: See history of present illness. GU:  Negative for dysuria, hematuria, urinary incontinence, urinary frequency, nocturnal urination.  MS: Negative for joint pain, low back pain.  Derm: Negative for rash or itching.  Neuro: Negative for weakness, abnormal sensation, seizure, frequent headaches, memory loss, confusion.  Psych: Negative for anxiety, depression, suicidal ideation, hallucinations.  Endo: Negative for unusual weight change.  Heme: Negative for bruising or bleeding. Allergy: Negative for rash or hives.    Physical Examination:  BP 119/80  Pulse 88  Temp(Src) 97.8 F (36.6 C) (Oral)  Ht 5\' 3"  (1.6 m)  Wt 147 lb 12.8 oz (67.042 kg)  BMI 26.19 kg/m2   General: Well-nourished, well-developed in no acute distress.  Head: Normocephalic, atraumatic.   Eyes: Conjunctiva pink, no icterus. Mouth:  Oropharyngeal mucosa moist and pink , no lesions erythema or exudate. Neck: Supple without thyromegaly, masses, or lymphadenopathy.  Lungs: Clear to auscultation bilaterally.  Heart: Regular rate and rhythm, no murmurs rubs or gallops.  Abdomen: Bowel sounds are normal, nontender, nondistended, no hepatosplenomegaly or masses, no abdominal bruits or    hernia , no rebound or guarding.   Rectal: deferred Extremities: No lower extremity edema. No clubbing or deformities.  Neuro: Alert and oriented x 4 , grossly normal neurologically.  Skin: Warm and dry, no rash or jaundice.   Psych: Alert and cooperative, normal mood and affect.

## 2013-12-07 NOTE — Assessment & Plan Note (Signed)
Chronic intermittent constipation. Recommend MiraLax 17 g at bedtime as needed. She is due for a screening colonoscopy. We'll proceed with colonoscopy at this time.  I have discussed the risks, alternatives, benefits with regards to but not limited to the risk of reaction to medication, bleeding, infection, perforation and the patient is agreeable to proceed. Written consent to be obtained.

## 2013-12-07 NOTE — Patient Instructions (Signed)
Start Miralax for constipation. Prescription sent to [facility]. Your insurance may require you to buy over the counter. Take one capful at bedtime as needed for constipation. Colonoscopy with Dr. Oneida Alar. See separate instructions.

## 2013-12-07 NOTE — Telephone Encounter (Signed)
This pt was seen in the office on 12/07/2013 for constipation and to be scheduled for colonoscopy.

## 2013-12-08 NOTE — Progress Notes (Signed)
cc'd to pcp 

## 2013-12-11 ENCOUNTER — Encounter (HOSPITAL_COMMUNITY): Payer: Self-pay | Admitting: Pharmacy Technician

## 2013-12-25 ENCOUNTER — Ambulatory Visit (HOSPITAL_COMMUNITY)
Admission: RE | Admit: 2013-12-25 | Discharge: 2013-12-25 | Disposition: A | Discharge status: Home / Self Care | Payer: Medicare Other | Source: Ambulatory Visit | Attending: Gastroenterology | Admitting: Gastroenterology

## 2013-12-25 ENCOUNTER — Encounter (HOSPITAL_COMMUNITY): Payer: Self-pay | Admitting: *Deleted

## 2013-12-25 ENCOUNTER — Encounter (HOSPITAL_COMMUNITY)
Admission: RE | Disposition: A | Discharge status: Home / Self Care | Payer: Self-pay | Source: Ambulatory Visit | Attending: Gastroenterology

## 2013-12-25 DIAGNOSIS — I10 Essential (primary) hypertension: Secondary | ICD-10-CM | POA: Insufficient documentation

## 2013-12-25 DIAGNOSIS — E785 Hyperlipidemia, unspecified: Secondary | ICD-10-CM | POA: Diagnosis not present

## 2013-12-25 DIAGNOSIS — Z79899 Other long term (current) drug therapy: Secondary | ICD-10-CM | POA: Insufficient documentation

## 2013-12-25 DIAGNOSIS — L259 Unspecified contact dermatitis, unspecified cause: Secondary | ICD-10-CM | POA: Insufficient documentation

## 2013-12-25 DIAGNOSIS — Z1211 Encounter for screening for malignant neoplasm of colon: Secondary | ICD-10-CM | POA: Diagnosis not present

## 2013-12-25 DIAGNOSIS — M349 Systemic sclerosis, unspecified: Secondary | ICD-10-CM | POA: Diagnosis not present

## 2013-12-25 DIAGNOSIS — Z881 Allergy status to other antibiotic agents status: Secondary | ICD-10-CM | POA: Insufficient documentation

## 2013-12-25 DIAGNOSIS — Z7982 Long term (current) use of aspirin: Secondary | ICD-10-CM | POA: Diagnosis not present

## 2013-12-25 DIAGNOSIS — Z880 Allergy status to penicillin: Secondary | ICD-10-CM | POA: Diagnosis not present

## 2013-12-25 DIAGNOSIS — M129 Arthropathy, unspecified: Secondary | ICD-10-CM | POA: Insufficient documentation

## 2013-12-25 DIAGNOSIS — K5900 Constipation, unspecified: Secondary | ICD-10-CM

## 2013-12-25 HISTORY — PX: COLONOSCOPY: SHX5424

## 2013-12-25 SURGERY — COLONOSCOPY
Anesthesia: Moderate Sedation

## 2013-12-25 MED ORDER — SODIUM CHLORIDE 0.9 % IV SOLN
INTRAVENOUS | Status: DC
  Administered 2013-12-25: 10:00:00 via INTRAVENOUS

## 2013-12-25 MED ORDER — MEPERIDINE HCL 100 MG/ML IJ SOLN
INTRAMUSCULAR | Status: DC | PRN
Start: 2013-12-25 — End: 2013-12-25
  Administered 2013-12-25 (×2): 25 mg via INTRAVENOUS

## 2013-12-25 MED ORDER — MEPERIDINE HCL 100 MG/ML IJ SOLN
INTRAMUSCULAR | Status: AC
  Filled 2013-12-25: qty 2

## 2013-12-25 MED ORDER — MIDAZOLAM HCL 5 MG/5ML IJ SOLN
INTRAMUSCULAR | Status: DC
Start: 2013-12-25 — End: 2013-12-25
  Filled 2013-12-25: qty 10

## 2013-12-25 MED ORDER — MIDAZOLAM HCL 5 MG/5ML IJ SOLN
INTRAMUSCULAR | Status: DC | PRN
  Administered 2013-12-25: 1 mg via INTRAVENOUS
  Administered 2013-12-25 (×2): 2 mg via INTRAVENOUS

## 2013-12-25 MED ORDER — STERILE WATER FOR IRRIGATION IR SOLN
Status: DC | PRN
  Administered 2013-12-25: 11:00:00

## 2013-12-25 NOTE — Discharge Instructions (Signed)
YOU DID NOT HAVE ANY POLYPS.   Use MIRALAX EVERY MON, WED, AND FRI.  FOLLOW A HIGH FIBER DIET. AVOID ITEMS THAT CAUSE BLOATING, gas or diarrhea. SEE INFO BELOW.  Next colonoscopy in 10 years.  Colonoscopy Care After Read the instructions outlined below and refer to this sheet in the next week. These discharge instructions provide you with general information on caring for yourself after you leave the hospital. While your treatment has been planned according to the most current medical practices available, unavoidable complications occasionally occur. If you have any problems or questions after discharge, call DR. Timur Nibert, [phone].  ACTIVITY  You may resume your regular activity, but move at a slower pace for the next 24 hours.   Take frequent rest periods for the next 24 hours.   Walking will help get rid of the air and reduce the bloated feeling in your belly (abdomen).   No driving for 24 hours (because of the medicine (anesthesia) used during the test).   You may shower.   Do not sign any important legal documents or operate any machinery for 24 hours (because of the anesthesia used during the test).    NUTRITION  Drink plenty of fluids.   You may resume your normal diet as instructed by your doctor.   Begin with a light meal and progress to your normal diet. Heavy or fried foods are harder to digest and may make you feel sick to your stomach (nauseated).   Avoid alcoholic beverages for 24 hours or as instructed.    MEDICATIONS  You may resume your normal medications.   WHAT YOU CAN EXPECT TODAY  Some feelings of bloating in the abdomen.   Passage of more gas than usual.   Spotting of blood in your stool or on the toilet paper  .  IF YOU HAD POLYPS REMOVED DURING THE COLONOSCOPY:  Eat a soft diet IF YOU HAVE NAUSEA, BLOATING, ABDOMINAL PAIN, OR VOMITING.    FINDING OUT THE RESULTS OF YOUR TEST Not all test results are available during your visit. DR.  Oneida Alar WILL CALL YOU WITHIN 7 DAYS OF YOUR PROCEDUE WITH YOUR RESULTS. Do not assume everything is normal if you have not heard from DR. Jovani Flury IN ONE WEEK, CALL HER OFFICE AT [phone].  SEEK IMMEDIATE MEDICAL ATTENTION AND CALL THE OFFICE: [phone] IF:  You have more than a spotting of blood in your stool.   Your belly is swollen (abdominal distention).   You are nauseated or vomiting.   You have a temperature over 101F.   You have abdominal pain or discomfort that is severe or gets worse throughout the day.  High-Fiber Diet A high-fiber diet changes your normal diet to include more whole grains, legumes, fruits, and vegetables. Changes in the diet involve replacing refined carbohydrates with unrefined foods. The calorie level of the diet is essentially unchanged. The Dietary Reference Intake (recommended amount) for adult males is 38 grams per day. For adult females, it is 25 grams per day. Pregnant and lactating women should consume 28 grams of fiber per day. Fiber is the intact part of a plant that is not broken down during digestion. Functional fiber is fiber that has been isolated from the plant to provide a beneficial effect in the body. PURPOSE  Increase stool bulk.   Ease and regulate bowel movements.   Lower cholesterol.  INDICATIONS THAT YOU NEED MORE FIBER  Constipation and hemorrhoids.   Uncomplicated diverticulosis (intestine condition) and irritable bowel syndrome.  Weight management.   As a protective measure against hardening of the arteries (atherosclerosis), diabetes, and cancer.   GUIDELINES FOR INCREASING FIBER IN THE DIET  Start adding fiber to the diet slowly. A gradual increase of about 5 more grams (2 slices of whole-wheat bread, 2 servings of most fruits or vegetables, or 1 bowl of high-fiber cereal) per day is best. Too rapid an increase in fiber may result in constipation, flatulence, and bloating.   Drink enough water and fluids to keep your  urine clear or pale yellow. Water, juice, or caffeine-free drinks are recommended. Not drinking enough fluid may cause constipation.   Eat a variety of high-fiber foods rather than one type of fiber.   Try to increase your intake of fiber through using high-fiber foods rather than fiber pills or supplements that contain small amounts of fiber.   The goal is to change the types of food eaten. Do not supplement your present diet with high-fiber foods, but replace foods in your present diet.  INCLUDE A VARIETY OF FIBER SOURCES  Replace refined and processed grains with whole grains, canned fruits with fresh fruits, and incorporate other fiber sources. White rice, white breads, and most bakery goods contain little or no fiber.   Brown whole-grain rice, buckwheat oats, and many fruits and vegetables are all good sources of fiber. These include: broccoli, Brussels sprouts, cabbage, cauliflower, beets, sweet potatoes, white potatoes (skin on), carrots, tomatoes, eggplant, squash, berries, fresh fruits, and dried fruits.   Cereals appear to be the richest source of fiber. Cereal fiber is found in whole grains and bran. Bran is the fiber-rich outer coat of cereal grain, which is largely removed in refining. In whole-grain cereals, the bran remains. In breakfast cereals, the largest amount of fiber is found in those with "bran" in their names. The fiber content is sometimes indicated on the label.   You may need to include additional fruits and vegetables each day.   In baking, for 1 cup white flour, you may use the following substitutions:   1 cup whole-wheat flour minus 2 tablespoons.   1/2 cup white flour plus 1/2 cup whole-wheat flour.   Hemorrhoids Hemorrhoids are dilated (enlarged) veins around the rectum. Sometimes clots will form in the veins. This makes them swollen and painful. These are called thrombosed hemorrhoids. Causes of hemorrhoids include:  Constipation.   Straining to have a  bowel movement.   HEAVY LIFTING HOME CARE INSTRUCTIONS  Eat a well balanced diet and drink 6 to 8 glasses of water every day to avoid constipation. You may also use a bulk laxative.   Avoid straining to have bowel movements.   Keep anal area dry and clean.   Do not use a donut shaped pillow or sit on the toilet for long periods. This increases blood pooling and pain.   Move your bowels when your body has the urge; this will require less straining and will decrease pain and pressure.

## 2013-12-25 NOTE — Progress Notes (Signed)
REVIEWED.  

## 2013-12-25 NOTE — Op Note (Signed)
[facility] [address] [place], [address]   COLONOSCOPY PROCEDURE REPORT  PATIENT: Julia Stevens, Julia Stevens  MR#: [id number] BIRTHDATE: 04-08-1946 , 76  yrs. old GENDER: Female ENDOSCOPIST: Barney Drain, MD REFERRED [id number] Moshe Cipro, M.D. PROCEDURE DATE:  12/25/2013 PROCEDURE:   Colonoscopy, screening INDICATIONS:Average risk patient for colon cancer. MEDICATIONS: Demerol 50 mg IV and Versed 5 mg IV  DESCRIPTION OF PROCEDURE:    Physical exam was performed.  Informed consent was obtained from the patient after explaining the benefits, risks, and alternatives to procedure.  The patient was connected to monitor and placed in left lateral position. Continuous oxygen was provided by nasal cannula and IV medicine administered through an indwelling cannula.  After administration of sedation and rectal exam, the patients rectum was intubated and the EC-[id number] ([id number])  colonoscope was advanced under direct visualization to the cecum.  The scope was removed slowly by carefully examining the color, texture, anatomy, and integrity mucosa on the way out.  The patient was recovered in endoscopy and discharged home in satisfactory condition.    COLON FINDINGS: The colon was redundant.  Manual abdominal counter-pressure was used to reach the cecum. OTHERWISE,  normal appearing cecum, ileocecal valve, and appendiceal orifice were identified.  The ascending, hepatic flexure, transverse, splenic flexure, descending, sigmoid colon and rectum appeared unremarkable.  No polyps, DIVERTICULA, or cancers were seen.  PREP QUALITY: good.  CECAL W/D TIME: 10 minutes     COMPLICATIONS: None  ENDOSCOPIC IMPRESSION: 1.   The colon is slightly redundant 2.   OTHERWISE, Normal colon  RECOMMENDATIONS: Use MIRALAX EVERY MON, WED, AND FRI. FOLLOW A HIGH FIBER DIET.  AVOID ITEMS THAT CAUSE BLOATING, gas or diarrhea.  Next colonoscopy in 10  years.       _______________________________ Lorrin MaisBarney Drain, MD 12/25/2013 8:07 PM

## 2013-12-25 NOTE — H&P (Signed)
   Primary Care Physician:  Tula Nakayama, MD Primary Gastroenterologist:  Dr. Oneida Alar  Pre-Procedure History & Physical: HPI:  Julia Stevens is a 68 y.o. female here for [place].  Past Medical History  Diagnosis Date  . Dysphagia   . Hyperlipidemia   . Eczema   . Psychosis   . Hypertension   . Scleroderma   . Arthritis   . Shingles     Past Surgical History  Procedure Laterality Date  . Total abdominal hysterectomy w/ bilateral salpingoophorectomy  1994  . Colonoscopy  06/26/2000    [name],     Prior to Admission medications   Medication Sig Start Date End Date Taking? Authorizing Provider  aspirin EC 81 MG tablet Take 1 tablet (81 mg total) by mouth daily. 12/29/12 12/29/13 Yes Fayrene Helper, MD  calcium carbonate (OS-CAL) 600 MG TABS Take 600 mg by mouth daily.     Yes Historical Provider, MD  diltiazem (CARDIZEM LA) 240 MG 24 hr tablet Take 1 tablet (240 mg total) by mouth daily. 01/13/13 01/13/14 Yes Fayrene Helper, MD  Multiple Vitamin (MULTIVITAMIN) capsule Take 1 capsule by mouth daily.     Yes Historical Provider, MD  nortriptyline (PAMELOR) 50 MG capsule TAKE 2 CAPSULES BY MOUTH AT BEDTIME. 08/21/13  Yes Fayrene Helper, MD  polyethylene glycol powder (GLYCOLAX/MIRALAX) powder Take one capful at bedtime as needed for constipation. 12/07/13  Yes Mahala Menghini, PA-C  triamterene-hydrochlorothiazide (MAXZIDE-25) 37.5-25 MG per tablet Take 1 tablet by mouth daily.   Yes Historical Provider, MD  polyethylene glycol-electrolytes (TRILYTE) 420 G solution Take 4,000 mLs by mouth as directed. 12/07/13   Danie Binder, MD    Allergies as of 12/07/2013 - Review Complete 12/07/2013  Allergen Reaction Noted  . Ace inhibitors Cough 08/21/2013  . Ciprofloxacin  03/22/2008  . Penicillins  12/26/2007    Family History  Problem Relation Age of Onset  . Hypertension Mother   . Heart disease Mother     CAD  . Hypertension Father      Severe DJD/  macrobacterium  . Hypertension Sister   . Hypertension Sister   . Fibromyalgia Sister     infection  . Diabetes Brother   . Colon cancer Neg Hx     History   Social History  . Marital Status: Single    Spouse Name: N/A    Number of Children: 0  . Years of Education: N/A   Occupational History  . disabled    .     Social History Main Topics  . Smoking status: Never Smoker   . Smokeless tobacco: Not on file  . Alcohol Use: No  . Drug Use: No  . Sexual Activity: Not Currently    Birth Control/ Protection: Surgical   Other Topics Concern  . Not on file   Social History Narrative  . No narrative on file    Review of Systems: See HPI, otherwise negative ROS   Physical Exam: There were no vitals taken for this visit. General:   Alert,  pleasant and cooperative in NAD Head:  Normocephalic and atraumatic. Neck:  Supple; Lungs:  Clear throughout to auscultation.    Heart:  Regular rate and rhythm. Abdomen:  Soft, nontender and nondistended. Normal bowel sounds, without guarding, and without rebound.   Neurologic:  Alert and  oriented x4;  grossly normal neurologically.  Impression/Plan:    SCREENING  Plan:  1. TCS TODAY

## 2013-12-30 ENCOUNTER — Encounter (HOSPITAL_COMMUNITY): Payer: Self-pay | Admitting: Gastroenterology

## 2014-01-01 ENCOUNTER — Encounter (HOSPITAL_COMMUNITY): Payer: Self-pay | Admitting: Emergency Medicine

## 2014-01-01 ENCOUNTER — Emergency Department (HOSPITAL_COMMUNITY)
Admission: EM | Admit: 2014-01-01 | Discharge: 2014-01-01 | Disposition: A | Discharge status: Home / Self Care | Payer: Medicare Other | Attending: Emergency Medicine | Admitting: Emergency Medicine

## 2014-01-01 DIAGNOSIS — Z862 Personal history of diseases of the blood and blood-forming organs and certain disorders involving the immune mechanism: Secondary | ICD-10-CM | POA: Insufficient documentation

## 2014-01-01 DIAGNOSIS — Z9889 Other specified postprocedural states: Secondary | ICD-10-CM | POA: Insufficient documentation

## 2014-01-01 DIAGNOSIS — Z79899 Other long term (current) drug therapy: Secondary | ICD-10-CM | POA: Diagnosis not present

## 2014-01-01 DIAGNOSIS — Z880 Allergy status to penicillin: Secondary | ICD-10-CM | POA: Diagnosis not present

## 2014-01-01 DIAGNOSIS — M545 Low back pain, unspecified: Secondary | ICD-10-CM | POA: Diagnosis not present

## 2014-01-01 DIAGNOSIS — N390 Urinary tract infection, site not specified: Secondary | ICD-10-CM | POA: Diagnosis not present

## 2014-01-01 DIAGNOSIS — I10 Essential (primary) hypertension: Secondary | ICD-10-CM | POA: Diagnosis not present

## 2014-01-01 DIAGNOSIS — Z90710 Acquired absence of both cervix and uterus: Secondary | ICD-10-CM | POA: Insufficient documentation

## 2014-01-01 DIAGNOSIS — Z8639 Personal history of other endocrine, nutritional and metabolic disease: Secondary | ICD-10-CM | POA: Insufficient documentation

## 2014-01-01 DIAGNOSIS — R110 Nausea: Secondary | ICD-10-CM | POA: Insufficient documentation

## 2014-01-01 DIAGNOSIS — Z8659 Personal history of other mental and behavioral disorders: Secondary | ICD-10-CM | POA: Insufficient documentation

## 2014-01-01 DIAGNOSIS — M129 Arthropathy, unspecified: Secondary | ICD-10-CM | POA: Insufficient documentation

## 2014-01-01 DIAGNOSIS — Z872 Personal history of diseases of the skin and subcutaneous tissue: Secondary | ICD-10-CM | POA: Diagnosis not present

## 2014-01-01 DIAGNOSIS — Z8619 Personal history of other infectious and parasitic diseases: Secondary | ICD-10-CM | POA: Diagnosis not present

## 2014-01-01 DIAGNOSIS — Z791 Long term (current) use of non-steroidal anti-inflammatories (NSAID): Secondary | ICD-10-CM | POA: Diagnosis not present

## 2014-01-01 LAB — URINALYSIS, ROUTINE W REFLEX MICROSCOPIC
Bilirubin Urine: NEGATIVE
GLUCOSE, UA: NEGATIVE mg/dL
Hgb urine dipstick: NEGATIVE
KETONES UR: NEGATIVE mg/dL
NITRITE: POSITIVE — AB
PROTEIN: NEGATIVE mg/dL
Specific Gravity, Urine: 1.02 (ref 1.005–1.030)
Urobilinogen, UA: 0.2 mg/dL (ref 0.0–1.0)
pH: 7.5 (ref 5.0–8.0)

## 2014-01-01 LAB — URINE MICROSCOPIC-ADD ON

## 2014-01-01 MED ORDER — PHENAZOPYRIDINE HCL 100 MG PO TABS
100.0000 mg | ORAL_TABLET | Freq: Once | ORAL | Status: AC
  Administered 2014-01-01: 100 mg via ORAL
  Filled 2014-01-01: qty 1

## 2014-01-01 MED ORDER — NITROFURANTOIN MONOHYD MACRO 100 MG PO CAPS
100.0000 mg | ORAL_CAPSULE | Freq: Once | ORAL | Status: AC
  Administered 2014-01-01: 100 mg via ORAL
  Filled 2014-01-01: qty 1

## 2014-01-01 MED ORDER — NITROFURANTOIN MONOHYD MACRO 100 MG PO CAPS: 100.0000 mg | ORAL_CAPSULE | Freq: Two times a day (BID) | ORAL | Status: DC

## 2014-01-01 MED ORDER — PHENAZOPYRIDINE HCL 200 MG PO TABS: 200.0000 mg | ORAL_TABLET | Freq: Three times a day (TID) | ORAL | Status: DC

## 2014-01-01 NOTE — ED Notes (Signed)
Reports "odd feeling" with urination x 1 wk, reports urinary frequency with dribbling.

## 2014-01-01 NOTE — Discharge Instructions (Signed)
Urinary Tract Infection  Urinary tract infections (UTIs) can develop anywhere along your urinary tract. Your urinary tract is your body's drainage system for removing wastes and extra water. Your urinary tract includes two kidneys, two ureters, a bladder, and a urethra. Your kidneys are a pair of bean-shaped organs. Each kidney is about the size of your fist. They are located below your ribs, one on each side of your spine.  CAUSES  Infections are caused by microbes, which are microscopic organisms, including fungi, viruses, and bacteria. These organisms are so small that they can only be seen through a microscope. Bacteria are the microbes that most commonly cause UTIs.  SYMPTOMS   Symptoms of UTIs may vary by age and gender of the patient and by the location of the infection. Symptoms in young women typically include a frequent and intense urge to urinate and a painful, burning feeling in the bladder or urethra during urination. Older women and men are more likely to be tired, shaky, and weak and have muscle aches and abdominal pain. A fever may mean the infection is in your kidneys. Other symptoms of a kidney infection include pain in your back or sides below the ribs, nausea, and vomiting.  DIAGNOSIS  To diagnose a UTI, your caregiver will ask you about your symptoms. Your caregiver also will ask to provide a urine sample. The urine sample will be tested for bacteria and white blood cells. White blood cells are made by your body to help fight infection.  TREATMENT   Typically, UTIs can be treated with medication. Because most UTIs are caused by a bacterial infection, they usually can be treated with the use of antibiotics. The choice of antibiotic and length of treatment depend on your symptoms and the type of bacteria causing your infection.  HOME CARE INSTRUCTIONS   If you were prescribed antibiotics, take them exactly as your caregiver instructs you. Finish the medication even if you feel better after you  have only taken some of the medication.   Drink enough water and fluids to keep your urine clear or pale yellow.   Avoid caffeine, tea, and carbonated beverages. They tend to irritate your bladder.   Empty your bladder often. Avoid holding urine for long periods of time.   Empty your bladder before and after sexual intercourse.   After a bowel movement, women should cleanse from front to back. Use each tissue only once.  SEEK MEDICAL CARE IF:    You have back pain.   You develop a fever.   Your symptoms do not begin to resolve within 3 days.  SEEK IMMEDIATE MEDICAL CARE IF:    You have severe back pain or lower abdominal pain.   You develop chills.   You have nausea or vomiting.   You have continued burning or discomfort with urination.  MAKE SURE YOU:    Understand these instructions.   Will watch your condition.   Will get help right away if you are not doing well or get worse.  Document Released: 05/16/2005 Document Revised: 02/05/2012 Document Reviewed: 09/14/2011  [employer] Patient Information 2014 [employer]

## 2014-01-01 NOTE — ED Provider Notes (Signed)
CSN: [id number]     Arrival date & time 01/01/14  1037 History  This chart was scribed for Tanna Furry, MD by Erling Conte, ED Scribe. This patient was seen in room APA02/APA02 and the patient's care was started at 11:27 AM.    Chief Complaint  Patient presents with  . Urinary Tract Infection      The history is provided by the patient. No language interpreter was used.   HPI Comments: Julia Stevens is a 68 y.o. female who presents to the Emergency Department complaining of a possible UTI. Patient states that she has had associated decreased urination, itching when urinating, intermittent burning lower back pain, right sided abdominal pain and nausea for 5 days. Patient states that she had a colonoscopy on 12/25/13 and that the symptoms started 2 days after the procedure. She states she has been having normal bowel movements. Patient denies any fever, dysuria, emesis, shortness of breath or chest pain.     Past Medical History  Diagnosis Date  . Dysphagia   . Hyperlipidemia   . Eczema   . Psychosis   . Hypertension   . Scleroderma   . Arthritis   . Shingles    Past Surgical History  Procedure Laterality Date  . Total abdominal hysterectomy w/ bilateral salpingoophorectomy  1994  . Colonoscopy  06/26/2000    [name],   . Colonoscopy N/A 12/25/2013    Procedure: COLONOSCOPY;  Surgeon: Danie Binder, MD;  Location: [facility];  Service: Endoscopy;  Laterality: N/A;  10:45AM   Family History  Problem Relation Age of Onset  . Hypertension Mother   . Heart disease Mother     CAD  . Hypertension Father      Severe DJD/ macrobacterium  . Hypertension Sister   . Hypertension Sister   . Fibromyalgia Sister     infection  . Diabetes Brother   . Colon cancer Neg Hx    History  Substance Use Topics  . Smoking status: Never Smoker   . Smokeless tobacco: Not on file  . Alcohol Use: No   OB History   Grav Para Term Preterm Abortions TAB SAB Ect Mult Living                  Review of Systems  Constitutional: Negative for fever, chills, diaphoresis, appetite change and fatigue.  HENT: Negative for mouth sores, sore throat and trouble swallowing.   Eyes: Negative for visual disturbance.  Respiratory: Negative for cough, chest tightness, shortness of breath and wheezing.   Cardiovascular: Negative for chest pain.  Gastrointestinal: Positive for nausea and abdominal pain. Negative for vomiting, diarrhea and abdominal distention.  Endocrine: Negative for polydipsia, polyphagia and polyuria.  Genitourinary: Positive for frequency (decreased) and difficulty urinating. Negative for dysuria and hematuria.  Musculoskeletal: Positive for back pain. Negative for gait problem.  Skin: Negative for color change, pallor and rash.  Neurological: Negative for dizziness, syncope, light-headedness and headaches.  Hematological: Does not bruise/bleed easily.  Psychiatric/Behavioral: Negative for behavioral problems and confusion.      Allergies  Ace inhibitors; Ciprofloxacin; and Penicillins  Home Medications   Prior to Admission medications   Medication Sig Start Date End Date Taking? Authorizing Provider  calcium carbonate (OS-CAL) 600 MG TABS Take 600 mg by mouth daily.      Historical Provider, MD  diltiazem (CARDIZEM LA) 240 MG 24 hr tablet Take 1 tablet (240 mg total) by mouth daily. 01/13/13 01/13/14  Fayrene Helper, MD  ibuprofen (  ADVIL,MOTRIN) 200 MG tablet Take 200 mg by mouth every 6 (six) hours as needed.    Historical Provider, MD  Multiple Vitamin (MULTIVITAMIN) capsule Take 1 capsule by mouth daily.      Historical Provider, MD  nortriptyline (PAMELOR) 50 MG capsule TAKE 2 CAPSULES BY MOUTH AT BEDTIME. 08/21/13   Fayrene Helper, MD  polyethylene glycol powder [facility]) powder Take one capful at bedtime as needed for constipation. 12/07/13   Mahala Menghini, PA-C  triamterene-hydrochlorothiazide (MAXZIDE-25) 37.5-25 MG per tablet Take 1 tablet by  mouth daily.    Historical Provider, MD   Triage Vitals: BP 125/75  Pulse 101  Temp(Src) 98.4 F (36.9 C) (Oral)  Resp 16  Ht 5\' 2"  (1.575 m)  Wt 147 lb (66.679 kg)  BMI 26.88 kg/m2  SpO2 98%  Physical Exam  Constitutional: She is oriented to person, place, and time. She appears well-developed and well-nourished. No distress.  HENT:  Head: Normocephalic.  Eyes: Conjunctivae are normal. Pupils are equal, round, and reactive to light. No scleral icterus.  Neck: Normal range of motion. Neck supple. No thyromegaly present.  Cardiovascular: Normal rate and regular rhythm.  Exam reveals no gallop and no friction rub.   No murmur heard. Pulmonary/Chest: Effort normal and breath sounds normal. No respiratory distress. She has no wheezes. She has no rales.  Abdominal: Soft. Bowel sounds are normal. She exhibits no distension. There is no tenderness. There is no rebound.  Reports right lower suprapubic pain. Non tender. Pain not present currently  Musculoskeletal: Normal range of motion.  Neurological: She is alert and oriented to person, place, and time.  Skin: Skin is warm and dry. No rash noted.  Psychiatric: She has a normal mood and affect. Her behavior is normal.    ED Course  Procedures (including critical care time)  DIAGNOSTIC STUDIES: Oxygen Saturation is 98% on RA, normal by my interpretation.    COORDINATION OF CARE: 11:32 AM Will order diagnostic lab work.  Pt advised of plan for treatment and pt agrees.     Labs Review Labs Reviewed  URINALYSIS, ROUTINE W REFLEX MICROSCOPIC - Abnormal; Notable for the following:    Nitrite POSITIVE (*)    Leukocytes, UA SMALL (*)    All other components within normal limits  URINE MICROSCOPIC-ADD ON - Abnormal; Notable for the following:    Bacteria, UA MANY (*)    All other components within normal limits    Imaging Review No results found.   EKG Interpretation None      MDM   Final diagnoses:  Urinary tract  infection    Given first dose of antibiotic and Pyridium here. She'll be discharged home with a prescription for the same.  I personally performed the services described in this documentation, which was scribed in my presence. The recorded information has been reviewed and is accurate.     Tanna Furry, MD 01/04/14 2137

## 2014-01-15 ENCOUNTER — Other Ambulatory Visit: Payer: Self-pay | Admitting: Family Medicine

## 2014-02-17 ENCOUNTER — Other Ambulatory Visit: Payer: Self-pay | Admitting: Family Medicine

## 2014-02-26 ENCOUNTER — Encounter (HOSPITAL_COMMUNITY): Payer: Self-pay | Admitting: Emergency Medicine

## 2014-02-26 ENCOUNTER — Emergency Department (HOSPITAL_COMMUNITY)
Admission: EM | Admit: 2014-02-26 | Discharge: 2014-02-26 | Disposition: A | Discharge status: Home / Self Care | Payer: Medicare Other | Attending: Emergency Medicine | Admitting: Emergency Medicine

## 2014-02-26 DIAGNOSIS — Z8639 Personal history of other endocrine, nutritional and metabolic disease: Secondary | ICD-10-CM | POA: Diagnosis not present

## 2014-02-26 DIAGNOSIS — Z880 Allergy status to penicillin: Secondary | ICD-10-CM | POA: Insufficient documentation

## 2014-02-26 DIAGNOSIS — M129 Arthropathy, unspecified: Secondary | ICD-10-CM | POA: Diagnosis not present

## 2014-02-26 DIAGNOSIS — Z7982 Long term (current) use of aspirin: Secondary | ICD-10-CM | POA: Diagnosis not present

## 2014-02-26 DIAGNOSIS — Z8619 Personal history of other infectious and parasitic diseases: Secondary | ICD-10-CM | POA: Diagnosis not present

## 2014-02-26 DIAGNOSIS — Y9389 Activity, other specified: Secondary | ICD-10-CM | POA: Diagnosis not present

## 2014-02-26 DIAGNOSIS — I10 Essential (primary) hypertension: Secondary | ICD-10-CM | POA: Insufficient documentation

## 2014-02-26 DIAGNOSIS — Z79899 Other long term (current) drug therapy: Secondary | ICD-10-CM | POA: Insufficient documentation

## 2014-02-26 DIAGNOSIS — Z862 Personal history of diseases of the blood and blood-forming organs and certain disorders involving the immune mechanism: Secondary | ICD-10-CM | POA: Insufficient documentation

## 2014-02-26 DIAGNOSIS — W268XXA Contact with other sharp object(s), not elsewhere classified, initial encounter: Secondary | ICD-10-CM | POA: Insufficient documentation

## 2014-02-26 DIAGNOSIS — Z8659 Personal history of other mental and behavioral disorders: Secondary | ICD-10-CM | POA: Insufficient documentation

## 2014-02-26 DIAGNOSIS — Z872 Personal history of diseases of the skin and subcutaneous tissue: Secondary | ICD-10-CM | POA: Insufficient documentation

## 2014-02-26 DIAGNOSIS — S60469A Insect bite (nonvenomous) of unspecified finger, initial encounter: Secondary | ICD-10-CM | POA: Diagnosis present

## 2014-02-26 DIAGNOSIS — S61209A Unspecified open wound of unspecified finger without damage to nail, initial encounter: Secondary | ICD-10-CM | POA: Diagnosis not present

## 2014-02-26 DIAGNOSIS — M795 Residual foreign body in soft tissue: Secondary | ICD-10-CM

## 2014-02-26 DIAGNOSIS — Y92009 Unspecified place in unspecified non-institutional (private) residence as the place of occurrence of the external cause: Secondary | ICD-10-CM | POA: Diagnosis not present

## 2014-02-26 DIAGNOSIS — S60459A Superficial foreign body of unspecified finger, initial encounter: Secondary | ICD-10-CM | POA: Diagnosis not present

## 2014-02-26 NOTE — ED Notes (Signed)
Pt says spider bite to LMF, distal phalanx, Tiny black speck  Present on pad.  Say paramedics came yesterday but decided to have her sister bring her. But could not  Come until today.  No swelling..  Thinks that the "rheumatism in my hand is worse today"

## 2014-02-26 NOTE — ED Notes (Addendum)
Pt reports a black widow spider but her on her left middle finger yesterday morning. Pt alert and oriented. Airway patent. Pt has pinpoint black spot noted to tip of left middle finger. Pt denies any n/v/weakness,numbness at this time. nad noted.

## 2014-02-26 NOTE — Discharge Instructions (Signed)
Return here if you develop any problems with your wound such as swelling,  Increased pain or redness.

## 2014-03-01 ENCOUNTER — Encounter: Payer: Self-pay | Admitting: Family Medicine

## 2014-03-01 ENCOUNTER — Ambulatory Visit (INDEPENDENT_AMBULATORY_CARE_PROVIDER_SITE_OTHER): Payer: Medicare Other | Admitting: Family Medicine

## 2014-03-01 VITALS — BP 130/82 | HR 78 | Resp 16 | Wt 144.1 lb

## 2014-03-01 DIAGNOSIS — L309 Dermatitis, unspecified: Secondary | ICD-10-CM

## 2014-03-01 DIAGNOSIS — M949 Disorder of cartilage, unspecified: Secondary | ICD-10-CM

## 2014-03-01 DIAGNOSIS — Z139 Encounter for screening, unspecified: Secondary | ICD-10-CM

## 2014-03-01 DIAGNOSIS — M899 Disorder of bone, unspecified: Secondary | ICD-10-CM | POA: Diagnosis not present

## 2014-03-01 DIAGNOSIS — E7849 Other hyperlipidemia: Secondary | ICD-10-CM

## 2014-03-01 DIAGNOSIS — R5383 Other fatigue: Secondary | ICD-10-CM | POA: Diagnosis not present

## 2014-03-01 DIAGNOSIS — N3000 Acute cystitis without hematuria: Secondary | ICD-10-CM | POA: Diagnosis not present

## 2014-03-01 DIAGNOSIS — M25519 Pain in unspecified shoulder: Secondary | ICD-10-CM | POA: Diagnosis not present

## 2014-03-01 DIAGNOSIS — I10 Essential (primary) hypertension: Secondary | ICD-10-CM | POA: Diagnosis not present

## 2014-03-01 DIAGNOSIS — Z79899 Other long term (current) drug therapy: Secondary | ICD-10-CM | POA: Diagnosis not present

## 2014-03-01 DIAGNOSIS — Z23 Encounter for immunization: Secondary | ICD-10-CM

## 2014-03-01 DIAGNOSIS — R5381 Other malaise: Secondary | ICD-10-CM

## 2014-03-01 DIAGNOSIS — E785 Hyperlipidemia, unspecified: Secondary | ICD-10-CM | POA: Diagnosis not present

## 2014-03-01 DIAGNOSIS — L259 Unspecified contact dermatitis, unspecified cause: Secondary | ICD-10-CM

## 2014-03-01 DIAGNOSIS — M25512 Pain in left shoulder: Secondary | ICD-10-CM

## 2014-03-01 LAB — POCT URINALYSIS DIPSTICK
BILIRUBIN UA: NEGATIVE
Blood, UA: NEGATIVE
Glucose, UA: NEGATIVE
Ketones, UA: NEGATIVE
NITRITE UA: NEGATIVE
PH UA: 7
Protein, UA: NEGATIVE
Spec Grav, UA: 1.015
Urobilinogen, UA: 0.2

## 2014-03-01 MED ORDER — NORTRIPTYLINE HCL 50 MG PO CAPS: ORAL_CAPSULE | ORAL | Status: DC

## 2014-03-01 MED ORDER — DILTIAZEM HCL ER COATED BEADS 240 MG PO CP24: ORAL_CAPSULE | ORAL | Status: DC

## 2014-03-01 MED ORDER — TRIAMTERENE-HCTZ 37.5-25 MG PO TABS: ORAL_TABLET | ORAL | Status: DC

## 2014-03-01 NOTE — ED Provider Notes (Signed)
CSN: [id number]     Arrival date & time 02/26/14  1416 History   First MD Initiated Contact with Patient 02/26/14 1433     Chief Complaint  Patient presents with  . Insect Bite     (Consider location/radiation/quality/duration/timing/severity/associated sxs/prior Treatment) The history is provided by the patient.    Julia Stevens is a 68 y.o. female who was exposed to what she thought was a black widow spider yesterday.  She was standing in her front doorway when a spider landed on her right 3rd distal finger and bit her.  She has had pain at the site since.  She denies swelling, radiation of pain, nausea, sob, vomiting or muscle spasm.  She has localized pain and complains of a dark spot at the site.  She has had no treatment prior to arrival.  She called ems yesterday who evaluated her at which time she  Declined transport, but comes today due to persistent symptoms.  She has had no treatment prior to arrival.     Past Medical History  Diagnosis Date  . Dysphagia   . Hyperlipidemia   . Eczema   . Psychosis   . Hypertension   . Scleroderma   . Arthritis   . Shingles    Past Surgical History  Procedure Laterality Date  . Total abdominal hysterectomy w/ bilateral salpingoophorectomy  1994  . Colonoscopy  06/26/2000    [name],   . Colonoscopy N/A 12/25/2013    Procedure: COLONOSCOPY;  Surgeon: Danie Binder, MD;  Location: [facility];  Service: Endoscopy;  Laterality: N/A;  10:45AM   Family History  Problem Relation Age of Onset  . Hypertension Mother   . Heart disease Mother     CAD  . Hypertension Father      Severe DJD/ macrobacterium  . Hypertension Sister   . Hypertension Sister   . Fibromyalgia Sister     infection  . Diabetes Brother   . Colon cancer Neg Hx    History  Substance Use Topics  . Smoking status: Never Smoker   . Smokeless tobacco: Not on file  . Alcohol Use: No   OB History   Grav Para Term Preterm Abortions TAB SAB Ect Mult Living            Review of Systems  Constitutional: Negative for fever and chills.  Respiratory: Negative for shortness of breath and wheezing.   Gastrointestinal: Negative for nausea and abdominal pain.  Musculoskeletal: Negative for myalgias.  Skin: Positive for wound.  Neurological: Negative for numbness.      Allergies  Ace inhibitors; Ciprofloxacin; and Penicillins  Home Medications   Prior to Admission medications   Medication Sig Start Date End Date Taking? Authorizing Provider  aspirin EC 81 MG tablet Take 81 mg by mouth daily.    Historical Provider, MD  calcium carbonate (OS-CAL) 600 MG TABS tablet Take 600 mg by mouth 2 (two) times daily with a meal.    Historical Provider, MD  diltiazem (CARDIZEM CD) 240 MG 24 hr capsule TAKE (1) TABLET BY MOUTH ONCE DAILY. 03/01/14   Fayrene Helper, MD  diltiazem (CARDIZEM LA) 240 MG 24 hr tablet Take 1 tablet (240 mg total) by mouth daily. 01/13/13 01/13/14  Fayrene Helper, MD  ibuprofen (ADVIL,MOTRIN) 200 MG tablet Take 200 mg by mouth every 6 (six) hours as needed for moderate pain.     Historical Provider, MD  Multiple Vitamin (MULTIVITAMIN) capsule Take 1 capsule by mouth daily.  Historical Provider, MD  nitrofurantoin, macrocrystal-monohydrate, (MACROBID) 100 MG capsule Take 1 capsule (100 mg total) by mouth 2 (two) times daily. 01/01/14   Tanna Furry, MD  nortriptyline (PAMELOR) 50 MG capsule TAKE 2 CAPSULES BY MOUTH AT BEDTIME. 03/01/14   Fayrene Helper, MD  phenazopyridine (PYRIDIUM) 200 MG tablet Take 1 tablet (200 mg total) by mouth 3 (three) times daily. 01/01/14   Tanna Furry, MD  triamterene-hydrochlorothiazide (MAXZIDE-25) 37.5-25 MG per tablet TAKE ONE TABLET DAILY. 03/01/14   Fayrene Helper, MD   BP 133/88  Pulse 87  Temp(Src) 98.7 F (37.1 C)  Resp 20  Ht 5' 1.5" (1.562 m)  Wt 147 lb (66.679 kg)  BMI 27.33 kg/m2  SpO2 100% Physical Exam  Constitutional: She is oriented to person, place, and time. She appears  well-developed and well-nourished.  HENT:  Head: Normocephalic.  Cardiovascular: Normal rate.   Pulmonary/Chest: Effort normal.  Neurological: She is alert and oriented to person, place, and time. No sensory deficit.  Skin: No ecchymosis, no petechiae and no rash noted. No erythema.  Tiny black visible dot which is also palpable at distal right long finger.  No redness, no red streaking or edema.    ED Course  FOREIGN BODY REMOVAL Date/Time: 02/26/2014 2:30 PM Performed by: Evalee Jefferson Authorized by: Evalee Jefferson Risks and benefits: risks, benefits and alternatives were discussed Patient identity confirmed: verbally with patient Time out: Immediately prior to procedure a "time out" was called to verify the correct patient, procedure, equipment, support staff and site/side marked as required. Body area: skin General location: upper extremity Location details: right long finger Anesthesia method: na. Patient cooperative: yes Localization method: visualized Removal mechanism: #21 needle tip. Dressing: antibiotic ointment Tendon involvement: none Depth: subcutaneous Complexity: simple 1 objects recovered. Objects recovered: tiny black linear fb - possible insect stinger vs wood splinter   (including critical care time) Labs Review Labs Reviewed - No data to display  Imaging Review No results found.   EKG Interpretation None      MDM   Final diagnoses:  Foreign body (FB) in soft tissue    Pt was seen by Dr. Lacinda Axon during this visit.  Prn f/u anticipated.  Insect bite which is now 19 hours old. No ulceration, no sign of skin breakdown or infection.  Doubt this was a dangerous insect bite.  No sign of skin necrosis or other breakdown.   Evalee Jefferson, PA-C 03/01/14 1502

## 2014-03-01 NOTE — Patient Instructions (Addendum)
F/u in January, call if you need me before  Come for flu vaccine in October, call for appt  Prevnar today   CBC, fasting lipid, chem 7 , tSH and Vit D early September  Urine is checked today for infection  No evidence of infection where you had the spider bite

## 2014-03-01 NOTE — Progress Notes (Signed)
   Subjective:    Patient ID: Julia Stevens, female    DOB: 04-12-1946, 68 y.o.   MRN: [id number]  HPI The PT is here for follow up and re-evaluation of chronic medical conditions, medication management and review of any available recent lab and radiology data.  Preventive health is updated, specifically  Cancer screening and Immunization.   Questions or concerns regarding consultations or procedures which the PT has had in the interim are  addressed. The PT denies any adverse reactions to current medications since the last visit.  C/o intermittent left shoulder pain , no recent trauma     Review of Systems See HPI Denies recent fever or chills. Denies sinus pressure, nasal congestion, ear pain or sore throat. Denies chest congestion, productive cough or wheezing. Denies chest pains, palpitations and leg swelling Denies abdominal pain, nausea, vomiting,diarrhea or constipation.   Denies dysuria, frequency, hesitancy or incontinence.  Denies headaches, seizures, numbness, or tingling. Denies depression, anxiety or insomnia.        Objective:   Physical Exam BP 130/82  Pulse 78  Resp 16  Wt 144 lb 1.9 oz (65.372 kg)  SpO2 98% Patient alert and oriented and in no cardiopulmonary distress.  HEENT: No facial asymmetry, EOMI,   oropharynx pink and moist.  Neck supple no JVD, no mass.  Chest: Clear to auscultation bilaterally.  CVS: S1, S2 no murmurs, no S3.Regular rate.  ABD: Soft non tender.   Ext: No edema  MS: Adequate ROM spine, shoulders, hips and knees.  Skin: Intact, no ulcerations or rash noted.  Psych: Good eye contact, normal affect. Memory intact not anxious or depressed appearing.  CNS: CN 2-12 intact, power,  normal throughout.no focal deficits noted.        Assessment & Plan:  HTN, goal below 140/80 Controlled, no change in medication DASH diet and commitment to daily physical activity for a minimum of 30 minutes discussed and encouraged, as a  part of hypertension management. The importance of attaining a healthy weight is also discussed.   Need for vaccination with 13-polyvalent pneumococcal conjugate vaccine Vaccine administered at visit.   Shoulder pain, left Tylenol for acute pain, NSAID use i is discouraged due to safety concerns  Familial hyperlipidemia, high LDL Hyperlipidemia:Low fat diet discussed and encouraged.  Updated lab needed at/ before next visit.   Eczema of both hands No current flare, importance of moisturizing is stressed

## 2014-03-03 LAB — URINE CULTURE: Colony Count: 3000

## 2014-03-04 NOTE — ED Provider Notes (Signed)
Medical screening examination/treatment/procedure(s) were performed by non-physician practitioner and as supervising physician I was immediately available for consultation/collaboration.   EKG Interpretation None       Nat Christen, MD 03/04/14 1914

## 2014-05-04 ENCOUNTER — Other Ambulatory Visit: Payer: Self-pay | Admitting: Family Medicine

## 2014-05-04 DIAGNOSIS — Z1231 Encounter for screening mammogram for malignant neoplasm of breast: Secondary | ICD-10-CM

## 2014-05-10 ENCOUNTER — Ambulatory Visit (HOSPITAL_COMMUNITY)
Admission: RE | Admit: 2014-05-10 | Discharge: 2014-05-10 | Disposition: A | Discharge status: Home / Self Care | Payer: Medicare Other | Source: Ambulatory Visit | Attending: Family Medicine | Admitting: Family Medicine

## 2014-05-10 DIAGNOSIS — Z1231 Encounter for screening mammogram for malignant neoplasm of breast: Secondary | ICD-10-CM | POA: Insufficient documentation

## 2014-06-01 DIAGNOSIS — Z23 Encounter for immunization: Secondary | ICD-10-CM | POA: Diagnosis not present

## 2014-06-13 DIAGNOSIS — Z23 Encounter for immunization: Secondary | ICD-10-CM | POA: Insufficient documentation

## 2014-06-13 DIAGNOSIS — M25512 Pain in left shoulder: Secondary | ICD-10-CM | POA: Insufficient documentation

## 2014-06-13 DIAGNOSIS — E7849 Other hyperlipidemia: Secondary | ICD-10-CM | POA: Insufficient documentation

## 2014-06-13 NOTE — Progress Notes (Signed)
   Subjective:    Patient ID: Julia Stevens, female    DOB: 1946-06-21, 68 y.o.   MRN: [id number]  HPI    Review of Systems C/o mild urinary frequency and dysuria, was recently treated for UTI on ED    Objective:   Physical Exam Abd: no flank or suprapubic tenderness, afebrile  CCUA : trace leukocyte       Assessment & Plan:  Possible UTI: specimen sent for c/s , will treat if posisitve

## 2014-06-13 NOTE — Assessment & Plan Note (Signed)
Controlled, no change in medication DASH diet and commitment to daily physical activity for a minimum of 30 minutes discussed and encouraged, as a part of hypertension management. The importance of attaining a healthy weight is also discussed.  

## 2014-06-13 NOTE — Assessment & Plan Note (Signed)
Tylenol for acute pain, NSAID use i is discouraged due to safety concerns

## 2014-06-13 NOTE — Assessment & Plan Note (Signed)
Vaccine administered at visit.  

## 2014-06-13 NOTE — Assessment & Plan Note (Signed)
Hyperlipidemia:Low fat diet discussed and encouraged.  Updated lab needed at/ before next visit.  

## 2014-06-13 NOTE — Assessment & Plan Note (Signed)
No current flare, importance of moisturizing is stressed

## 2014-06-14 DIAGNOSIS — Z23 Encounter for immunization: Secondary | ICD-10-CM | POA: Diagnosis not present

## 2014-06-14 NOTE — Addendum Note (Signed)
Addended by: Eual Fines on: 06/14/2014 09:14 AM   Modules accepted: Orders

## 2014-07-21 ENCOUNTER — Other Ambulatory Visit: Payer: Self-pay | Admitting: Family Medicine

## 2014-07-21 DIAGNOSIS — Z79899 Other long term (current) drug therapy: Secondary | ICD-10-CM | POA: Diagnosis not present

## 2014-07-21 DIAGNOSIS — E785 Hyperlipidemia, unspecified: Secondary | ICD-10-CM | POA: Diagnosis not present

## 2014-07-21 DIAGNOSIS — I10 Essential (primary) hypertension: Secondary | ICD-10-CM | POA: Diagnosis not present

## 2014-07-22 LAB — CBC WITH DIFFERENTIAL/PLATELET
BASOS ABS: 0 10*3/uL (ref 0.0–0.1)
BASOS PCT: 0 % (ref 0–1)
EOS ABS: 0 10*3/uL (ref 0.0–0.7)
EOS PCT: 0 % (ref 0–5)
HCT: 36.6 % (ref 36.0–46.0)
Hemoglobin: 12.2 g/dL (ref 12.0–15.0)
Lymphocytes Relative: 39 % (ref 12–46)
Lymphs Abs: 1.1 10*3/uL (ref 0.7–4.0)
MCH: 30 pg (ref 26.0–34.0)
MCHC: 33.3 g/dL (ref 30.0–36.0)
MCV: 90.1 fL (ref 78.0–100.0)
Monocytes Absolute: 0.4 10*3/uL (ref 0.1–1.0)
Monocytes Relative: 13 % — ABNORMAL HIGH (ref 3–12)
NEUTROS PCT: 48 % (ref 43–77)
Neutro Abs: 1.4 10*3/uL — ABNORMAL LOW (ref 1.7–7.7)
PLATELETS: 210 10*3/uL (ref 150–400)
RBC: 4.06 MIL/uL (ref 3.87–5.11)
RDW: 14.5 % (ref 11.5–15.5)
WBC: 2.9 10*3/uL — ABNORMAL LOW (ref 4.0–10.5)

## 2014-07-22 LAB — LIPID PANEL
CHOL/HDL RATIO: 5.6 ratio
CHOLESTEROL: 220 mg/dL — AB (ref 0–200)
HDL: 39 mg/dL — ABNORMAL LOW (ref >39)
LDL Cholesterol: 151 mg/dL — ABNORMAL HIGH (ref 0–99)
Triglycerides: 150 mg/dL — ABNORMAL HIGH (ref <150)
VLDL: 30 mg/dL (ref 0–40)

## 2014-07-22 LAB — TSH: TSH: 2.534 u[IU]/mL (ref 0.350–4.500)

## 2014-07-22 LAB — BASIC METABOLIC PANEL
BUN: 14 mg/dL (ref 6–23)
CHLORIDE: 104 meq/L (ref 96–112)
CO2: 27 meq/L (ref 19–32)
Calcium: 9.2 mg/dL (ref 8.4–10.5)
Creat: 0.93 mg/dL (ref 0.50–1.10)
Glucose, Bld: 73 mg/dL (ref 70–99)
POTASSIUM: 3.7 meq/L (ref 3.5–5.3)
SODIUM: 138 meq/L (ref 135–145)

## 2014-07-22 LAB — VITAMIN D 25 HYDROXY (VIT D DEFICIENCY, FRACTURES): Vit D, 25-Hydroxy: 31 ng/mL (ref 30–100)

## 2014-07-26 LAB — HEPATIC FUNCTION PANEL
ALBUMIN: 4 g/dL (ref 3.5–5.2)
ALT: 27 U/L (ref 0–35)
AST: 40 U/L — ABNORMAL HIGH (ref 0–37)
Alkaline Phosphatase: 49 U/L (ref 39–117)
BILIRUBIN TOTAL: 0.5 mg/dL (ref 0.2–1.2)
Bilirubin, Direct: 0.1 mg/dL (ref 0.0–0.3)
Indirect Bilirubin: 0.4 mg/dL (ref 0.2–1.2)
TOTAL PROTEIN: 7.7 g/dL (ref 6.0–8.3)

## 2014-09-01 ENCOUNTER — Encounter: Payer: Self-pay | Admitting: Family Medicine

## 2014-09-01 ENCOUNTER — Ambulatory Visit (INDEPENDENT_AMBULATORY_CARE_PROVIDER_SITE_OTHER): Payer: Medicare Other | Admitting: Family Medicine

## 2014-09-01 VITALS — BP 120/80 | HR 80 | Resp 16 | Ht 62.0 in | Wt 148.0 lb

## 2014-09-01 DIAGNOSIS — I10 Essential (primary) hypertension: Secondary | ICD-10-CM | POA: Diagnosis not present

## 2014-09-01 DIAGNOSIS — L309 Dermatitis, unspecified: Secondary | ICD-10-CM

## 2014-09-01 DIAGNOSIS — B369 Superficial mycosis, unspecified: Secondary | ICD-10-CM

## 2014-09-01 DIAGNOSIS — M069 Rheumatoid arthritis, unspecified: Secondary | ICD-10-CM | POA: Diagnosis not present

## 2014-09-01 DIAGNOSIS — F29 Unspecified psychosis not due to a substance or known physiological condition: Secondary | ICD-10-CM | POA: Diagnosis not present

## 2014-09-01 DIAGNOSIS — E785 Hyperlipidemia, unspecified: Secondary | ICD-10-CM

## 2014-09-01 MED ORDER — CLOTRIMAZOLE-BETAMETHASONE 1-0.05 % EX CREA: TOPICAL_CREAM | CUTANEOUS | Status: DC

## 2014-09-01 MED ORDER — NORTRIPTYLINE HCL 50 MG PO CAPS: ORAL_CAPSULE | ORAL | Status: DC

## 2014-09-01 MED ORDER — TRIAMTERENE-HCTZ 37.5-25 MG PO TABS: ORAL_TABLET | ORAL | Status: DC

## 2014-09-01 MED ORDER — PREDNISONE 5 MG PO TABS: 5.0000 mg | ORAL_TABLET | Freq: Two times a day (BID) | ORAL | Status: AC

## 2014-09-01 NOTE — Progress Notes (Signed)
   Subjective:    Patient ID: Julia Stevens, female    DOB: 10-Dec-1945, 69 y.o.   MRN: [id number]  HPI The PT is here for follow up and re-evaluation of chronic medical conditions, medication management and review of any available recent lab and radiology data.  Preventive health is updated, specifically  Cancer screening and Immunization.   . The PT denies any adverse reactions to current medications since the last visit. Has not been taking a statin, states it makes her generalized pains worse, has been working with dietary change only C/o itchy rash on dorsum of right foot for the past several weeks , which is spreading despite soaking the foot. C/o generalized joint pains increased espescialy in her hands for the past several weeks        Review of Systems See HPI Denies recent fever or chills. Denies sinus pressure, nasal congestion, ear pain or sore throat. Denies chest congestion, productive cough or wheezing. Denies chest pains, palpitations and leg swelling Denies abdominal pain, nausea, vomiting,diarrhea or constipation.   Denies dysuria, frequency, hesitancy or incontinence. Denies headaches, seizures, numbness, or tingling. Denies depression, anxiety or insomnia. .        Objective:   Physical Exam BP 120/80 mmHg  Pulse 80  Resp 16  Ht 5\' 2"  (1.575 m)  Wt 148 lb (67.132 kg)  BMI 27.06 kg/m2  SpO2 99% Patient alert and oriented and in no cardiopulmonary distress.  HEENT: No facial asymmetry, EOMI,   oropharynx pink and moist.  Neck supple no JVD, no mass.  Chest: Clear to auscultation bilaterally.  CVS: S1, S2 no murmurs, no S3.Regular rate.  ABD: Soft non tender.   Ext: No edema  MS: Adequate ROM spine, shoulders, hips and knees.  Skin: Intact, fungal  rash  On dorsum of right foot, diameter approx 3 cm , also severely dry skin on both palms  Psych: Good eye contact, normal affect. Memory intact not anxious or depressed appearing.  CNS: CN 2-12  intact, power,  normal throughout.no focal deficits noted.        Assessment & Plan:  Nonorganic psychosis Controlled, no change in medication    HTN, goal below 140/80 Controlled, no change in medication DASH diet and commitment to daily physical activity for a minimum of 30 minutes discussed and encouraged, as a part of hypertension management. The importance of attaining a healthy weight is also discussed.    Rheumatoid arthritis of hand Increased pain and stiffness x 1 week, prednisone x 5 days prescribed   Hyperlipidemia LDL goal <100 Deteriorated, pt has generalized pains which seem to worsen  Hyperlipidemia:Low fat diet discussed and encouraged.  Updated lab needed at/ before next visit.    Dermatomycosis of foot Acute infection for past several weeks which is worsening, topical medication prescribed   Eczema of both hands Acute flare, oral prednisone for 5 days and liberal use of emollient

## 2014-09-01 NOTE — Assessment & Plan Note (Addendum)
Increased pain and stiffness x 1 week, prednisone x 5 days prescribed

## 2014-09-01 NOTE — Assessment & Plan Note (Signed)
Controlled, no change in medication DASH diet and commitment to daily physical activity for a minimum of 30 minutes discussed and encouraged, as a part of hypertension management. The importance of attaining a healthy weight is also discussed.  

## 2014-09-01 NOTE — Assessment & Plan Note (Signed)
Acute flare, oral prednisone for 5 days and liberal use of emollient

## 2014-09-01 NOTE — Patient Instructions (Addendum)
Annual wellness in 4.5 month, call if you need me before  Fasting lipid, cmp in 4.5 month  Please reduce fried and fatty foods, cheese and butter, youtr cholesterol is too high  Medication is prescribed for rash and also for joint pains  Stop using ibuprofen for pain, use tylenol instead

## 2014-09-01 NOTE — Assessment & Plan Note (Signed)
Controlled, no change in medication  

## 2014-09-01 NOTE — Assessment & Plan Note (Addendum)
Deteriorated, pt has generalized pains which seem to worsen  Hyperlipidemia:Low fat diet discussed and encouraged.  Updated lab needed at/ before next visit.

## 2014-09-01 NOTE — Assessment & Plan Note (Signed)
Acute infection for past several weeks which is worsening, topical medication prescribed

## 2014-09-20 ENCOUNTER — Other Ambulatory Visit: Payer: Self-pay | Admitting: Family Medicine

## 2015-02-02 ENCOUNTER — Ambulatory Visit (INDEPENDENT_AMBULATORY_CARE_PROVIDER_SITE_OTHER): Payer: Medicare Other | Admitting: Family Medicine

## 2015-02-02 ENCOUNTER — Encounter: Payer: Self-pay | Admitting: Family Medicine

## 2015-02-02 VITALS — BP 114/72 | HR 79 | Resp 16 | Ht 60.25 in | Wt 152.4 lb

## 2015-02-02 DIAGNOSIS — Z0000 Encounter for general adult medical examination without abnormal findings: Secondary | ICD-10-CM

## 2015-02-02 DIAGNOSIS — L309 Dermatitis, unspecified: Secondary | ICD-10-CM | POA: Insufficient documentation

## 2015-02-02 DIAGNOSIS — E785 Hyperlipidemia, unspecified: Secondary | ICD-10-CM | POA: Diagnosis not present

## 2015-02-02 DIAGNOSIS — I10 Essential (primary) hypertension: Secondary | ICD-10-CM | POA: Diagnosis not present

## 2015-02-02 MED ORDER — PREDNISONE 5 MG PO TABS: 5.0000 mg | ORAL_TABLET | Freq: Two times a day (BID) | ORAL | Status: AC

## 2015-02-02 MED ORDER — GABAPENTIN 100 MG PO CAPS: 100.0000 mg | ORAL_CAPSULE | Freq: Every day | ORAL | Status: DC

## 2015-02-02 NOTE — Assessment & Plan Note (Signed)
Annual exam as documented. Counseling done  re healthy lifestyle involving commitment to 150 minutes exercise per week, heart healthy diet, and attaining healthy weight.The importance of adequate sleep also discussed.  home safety, is also discussed. Changes in health habits are decided on by the patient with goals and time frames  set for achieving them. Immunization and cancer screening needs are specifically addressed at this visit.  

## 2015-02-02 NOTE — Progress Notes (Signed)
Subjective:    Patient ID: Julia Stevens, female    DOB: 08/02/1946, 69 y.o.   MRN: [id number]  HPI Preventive Screening-Counseling & Management   Patient present here today for a Medicare annual wellness visit. Burning rash on RUE since May, had on left as well which has gone Darkened skin of both elbows and knees   Current Problems (verified)   Medications Prior to Visit Allergies (verified)   PAST HISTORY  Family History (verified)   Social History Single, never married, no children and 1 brother lives with her. No history of nicotine use    Risk Factors  Current exercise habits: states she is always busy around the house and that is all the exercise she gets is from walking around her house    Dietary issues discussed: Heart healthy diet discussed, limits fried fatty foods and red meat    Cardiac risk factors: Mother heart disease  Depression Screen  (Note: if answer to either of the following is "Yes", a more complete depression screening is indicated)   Over the past two weeks, have you felt down, depressed or hopeless? No  Over the past two weeks, have you felt little interest or pleasure in doing things? No  Have you lost interest or pleasure in daily life? No  Do you often feel hopeless? No  Do you cry easily over simple problems? No   Activities of Daily Living  In your present state of health, do you have any difficulty performing the following activities?  Driving?: doesn't drive, brother and sisters transport her  Managing money?: No Feeding yourself?:No Getting from bed to chair?:No Climbing a flight of stairs?: yes she climb them slowly  Preparing food and eating?:No Bathing or showering?:No Getting dressed?:No Getting to the toilet?:No Using the toilet?:No Moving around from place to place?: No  Fall Risk Assessment In the past year have you fallen or had a near fall?:No Are you currently taking any medications that make you  dizzy?:No   Hearing Difficulties: No Do you often ask people to speak up or repeat themselves?:No Do you experience ringing or noises in your ears?:No Do you have difficulty understanding soft or whispered voices?:No  Cognitive Testing  Alert? Yes Normal Appearance?Yes  Oriented to person? Yes Place? Yes  Time? Yes  Displays appropriate judgment?Yes  Can read the correct time from a watch face? yes Are you having problems remembering things?No  Advanced Directives have been discussed with the patient?Yes, will give brochure , full code   List the Names of Other Physician/Practitioners you currently use:  None   Indicate any recent Medical Services you may have received from other than [facility] providers in the past year (date may be approximate).   Assessment:    Annual Wellness Exam   Plan:    During the course of the visit the patient was educated and counseled about appropriate screening and preventive services including:  A healthy diet is rich in fruit, vegetables and whole grains. Poultry fish, nuts and beans are a healthy choice for protein rather then red meat. A low sodium diet and drinking 64 ounces of water daily is generally recommended. Oils and sweet should be limited. Carbohydrates especially for those who are diabetic or overweight, should be limited to 30-45 gram per meal. It is important to eat on a regular schedule, at least 3 times daily. Snacks should be primarily fruits, vegetables or nuts. It is important that you exercise regularly at least 30 minutes 5 times a  week. If you develop chest pain, have severe difficulty breathing, or feel very tired, stop exercising immediately and seek medical attention  Immunization reviewed and updated. Cancer screening reviewed and updated    Patient Instructions (the written plan) was given to the patient.  Medicare Attestation  I have personally reviewed:  The patient's medical and social history  Their use of alcohol,  tobacco or illicit drugs  Their current medications and supplements  The patient's functional ability including ADLs,fall risks, home safety risks, cognitive, and hearing and visual impairment  Diet and physical activities  Evidence for depression or mood disorders  The patient's weight, height, BMI, and visual acuity have been recorded in the chart. I have made referrals, counseling, and provided education to the patient based on review of the above and I have provided the patient with a written personalized care plan for preventive services.      Review of Systems     Objective:   Physical Exam BP 114/72 mmHg  Pulse 79  Resp 16  Ht 5' 0.25" (1.53 m)  Wt 152 lb 6.4 oz (69.128 kg)  BMI 29.53 kg/m2  SpO2 99%   Skin: erythema and small ulcerating rash on RUE Hyperpigmentation of skin of elbows           Assessment & Plan:  Medicare annual wellness visit, subsequent Annual exam as documented. Counseling done  re healthy lifestyle involving commitment to 150 minutes exercise per week, heart healthy diet, and attaining healthy weight.The importance of adequate sleep also discussed.  home safety, is also discussed. Changes in health habits are decided on by the patient with goals and time frames  set for achieving them. Immunization and cancer screening needs are specifically addressed at this visit.   Dermatitis Areas of hyperpigmentation where pt has scratched excessively, already has medication for bleaching skin, but dissatisfied , and may call for dermatology eval. Med , gabapentin started for bedtime use to help with itch

## 2015-02-02 NOTE — Patient Instructions (Signed)
Annual physical exam in 5 month, call if you need me before  Tablets are sent in for the rash on the arm  If you remain concerned about dark skin on elbows and knees , pls call for dermatology to help I recommend moisturizing daily, and use of the hydroquinone / bleaching cream as you see fit.   It is extremely difficult to change skin color , and very limited success  Thanks for choosing [facility], we consider it a privelige to serve you. Please work on good  health habits so that your health will improve. 1. Commitment to daily physical activity for 30 to 60  minutes, if you are able to do this.  2. Commitment to wise food choices. Aim for half of your  food intake to be vegetable and fruit, one quarter starchy foods, and one quarter protein. Try to eat on a regular schedule  3 meals per day, snacking between meals should be limited to vegetables or fruits or small portions of nuts. 64 ounces of water per day is generally recommended, unless you have specific health conditions, like heart failure or kidney failure where you will need to limit fluid intake.  3. Commitment to sufficient and a  good quality of physical and mental rest daily, generally between 6 to 8 hours per day.  WITH PERSISTANCE AND PERSEVERANCE, THE IMPOSSIBLE , BECOMES THE NORM!

## 2015-02-03 LAB — COMPREHENSIVE METABOLIC PANEL
ALT: 17 U/L (ref 0–35)
AST: 30 U/L (ref 0–37)
Albumin: 4.3 g/dL (ref 3.5–5.2)
Alkaline Phosphatase: 52 U/L (ref 39–117)
BUN: 15 mg/dL (ref 6–23)
CO2: 28 mEq/L (ref 19–32)
CREATININE: 0.98 mg/dL (ref 0.50–1.10)
Calcium: 9.1 mg/dL (ref 8.4–10.5)
Chloride: 103 mEq/L (ref 96–112)
Glucose, Bld: 80 mg/dL (ref 70–99)
Potassium: 3 mEq/L — ABNORMAL LOW (ref 3.5–5.3)
Sodium: 138 mEq/L (ref 135–145)
Total Bilirubin: 0.5 mg/dL (ref 0.2–1.2)
Total Protein: 8 g/dL (ref 6.0–8.3)

## 2015-02-03 LAB — LIPID PANEL
CHOL/HDL RATIO: 5.6 ratio
CHOLESTEROL: 202 mg/dL — AB (ref 0–200)
HDL: 36 mg/dL — AB (ref >=46)
LDL CALC: 125 mg/dL — AB (ref 0–99)
Triglycerides: 205 mg/dL — ABNORMAL HIGH (ref <150)
VLDL: 41 mg/dL — AB (ref 0–40)

## 2015-02-04 ENCOUNTER — Other Ambulatory Visit: Payer: Self-pay

## 2015-02-04 MED ORDER — POTASSIUM CHLORIDE ER 10 MEQ PO TBCR
10.0000 meq | EXTENDED_RELEASE_TABLET | Freq: Two times a day (BID) | ORAL | Status: DC
Start: 2015-02-04 — End: 2015-07-05

## 2015-02-18 ENCOUNTER — Other Ambulatory Visit: Payer: Self-pay | Admitting: Family Medicine

## 2015-02-20 NOTE — Assessment & Plan Note (Signed)
Areas of hyperpigmentation where pt has scratched excessively, already has medication for bleaching skin, but dissatisfied , and may call for dermatology eval. Med , gabapentin started for bedtime use to help with itch

## 2015-03-23 ENCOUNTER — Other Ambulatory Visit: Payer: Self-pay | Admitting: Family Medicine

## 2015-04-21 ENCOUNTER — Other Ambulatory Visit: Payer: Self-pay | Admitting: Family Medicine

## 2015-06-22 ENCOUNTER — Other Ambulatory Visit: Payer: Self-pay | Admitting: Family Medicine

## 2015-07-05 ENCOUNTER — Encounter: Payer: Self-pay | Admitting: Family Medicine

## 2015-07-05 ENCOUNTER — Ambulatory Visit (INDEPENDENT_AMBULATORY_CARE_PROVIDER_SITE_OTHER): Payer: Medicare Other | Admitting: Family Medicine

## 2015-07-05 VITALS — BP 120/82 | HR 94 | Resp 16 | Ht 60.0 in | Wt 157.0 lb

## 2015-07-05 DIAGNOSIS — Z0000 Encounter for general adult medical examination without abnormal findings: Secondary | ICD-10-CM | POA: Diagnosis not present

## 2015-07-05 DIAGNOSIS — Z23 Encounter for immunization: Secondary | ICD-10-CM | POA: Diagnosis not present

## 2015-07-05 DIAGNOSIS — E785 Hyperlipidemia, unspecified: Secondary | ICD-10-CM

## 2015-07-05 DIAGNOSIS — Z1211 Encounter for screening for malignant neoplasm of colon: Secondary | ICD-10-CM

## 2015-07-05 DIAGNOSIS — I10 Essential (primary) hypertension: Secondary | ICD-10-CM | POA: Diagnosis not present

## 2015-07-05 DIAGNOSIS — L299 Pruritus, unspecified: Secondary | ICD-10-CM | POA: Insufficient documentation

## 2015-07-05 DIAGNOSIS — M130 Polyarthritis, unspecified: Secondary | ICD-10-CM | POA: Diagnosis not present

## 2015-07-05 LAB — BASIC METABOLIC PANEL
BUN: 12 mg/dL (ref 7–25)
CALCIUM: 9.3 mg/dL (ref 8.6–10.4)
CHLORIDE: 102 mmol/L (ref 98–110)
CO2: 25 mmol/L (ref 20–31)
CREATININE: 1 mg/dL — AB (ref 0.50–0.99)
GLUCOSE: 83 mg/dL (ref 65–99)
Potassium: 4.6 mmol/L (ref 3.5–5.3)
Sodium: 137 mmol/L (ref 135–146)

## 2015-07-05 LAB — POC HEMOCCULT BLD/STL (OFFICE/1-CARD/DIAGNOSTIC): Fecal Occult Blood, POC: NEGATIVE

## 2015-07-05 LAB — CBC WITH DIFFERENTIAL/PLATELET
BASOS ABS: 0 10*3/uL (ref 0.0–0.1)
Basophils Relative: 1 % (ref 0–1)
EOS ABS: 0 10*3/uL (ref 0.0–0.7)
Eosinophils Relative: 0 % (ref 0–5)
HCT: 38.7 % (ref 36.0–46.0)
HEMOGLOBIN: 13.3 g/dL (ref 12.0–15.0)
LYMPHS ABS: 1.5 10*3/uL (ref 0.7–4.0)
LYMPHS PCT: 40 % (ref 12–46)
MCH: 30.7 pg (ref 26.0–34.0)
MCHC: 34.4 g/dL (ref 30.0–36.0)
MCV: 89.4 fL (ref 78.0–100.0)
MONOS PCT: 11 % (ref 3–12)
MPV: 10.4 fL (ref 8.6–12.4)
Monocytes Absolute: 0.4 10*3/uL (ref 0.1–1.0)
NEUTROS ABS: 1.8 10*3/uL (ref 1.7–7.7)
NEUTROS PCT: 48 % (ref 43–77)
PLATELETS: 235 10*3/uL (ref 150–400)
RBC: 4.33 MIL/uL (ref 3.87–5.11)
RDW: 14.2 % (ref 11.5–15.5)
WBC: 3.7 10*3/uL — ABNORMAL LOW (ref 4.0–10.5)

## 2015-07-05 MED ORDER — POTASSIUM CHLORIDE ER 10 MEQ PO TBCR: 10.0000 meq | EXTENDED_RELEASE_TABLET | Freq: Two times a day (BID) | ORAL | Status: DC

## 2015-07-05 MED ORDER — IBUPROFEN 800 MG PO TABS: ORAL_TABLET | ORAL | Status: DC

## 2015-07-05 MED ORDER — PREDNISONE 5 MG PO TABS: 5.0000 mg | ORAL_TABLET | Freq: Two times a day (BID) | ORAL | Status: AC

## 2015-07-05 MED ORDER — KETOROLAC TROMETHAMINE 60 MG/2ML IM SOLN
60.0000 mg | Freq: Once | INTRAMUSCULAR | Status: AC
  Administered 2015-07-05: 60 mg via INTRAMUSCULAR

## 2015-07-05 MED ORDER — DILTIAZEM HCL ER COATED BEADS 240 MG PO CP24: ORAL_CAPSULE | ORAL | Status: DC

## 2015-07-05 MED ORDER — HYDROXYZINE HCL 25 MG PO TABS: ORAL_TABLET | ORAL | Status: DC

## 2015-07-05 MED ORDER — METHYLPREDNISOLONE ACETATE 80 MG/ML IJ SUSP
80.0000 mg | Freq: Once | INTRAMUSCULAR | Status: AC
Start: 2015-07-05 — End: 2015-07-05
  Administered 2015-07-05: 80 mg via INTRAMUSCULAR

## 2015-07-05 NOTE — Patient Instructions (Addendum)
F/u in 4 month, call if you need me before  Flu vaccine today  Injections and short course of medication sent for joint pain  New medication hydroxyzine at night for itch  Please keep skin well moisturized with twice daily use of moisturizing cream  Labs today  Thanks for choosing [facility], we consider it a privelige to serve you.

## 2015-07-05 NOTE — Assessment & Plan Note (Signed)
Uncontrolled.Toradol and depo medrol administered IM in the office , to be followed by a short course of oral prednisone and NSAIDS.  

## 2015-07-05 NOTE — Assessment & Plan Note (Signed)

## 2015-07-05 NOTE — Progress Notes (Signed)
   Subjective:    Patient ID: Julia Stevens, female    DOB: 01-Sep-1945, 69 y.o.   MRN: [id number]  HPI Patient is in for annual physical exam. Immunization is reviewed , and  updated . C/o ankle pain rated between a 7 and 8, no trauma, also has generalized joint pain, needs help C/o generalized itching, no new rash or new product      Review of Systems See HPI     Objective:   Physical Exam BP 120/82 mmHg  Pulse 94  Resp 16  Ht 5' (1.524 m)  Wt 157 lb (71.215 kg)  BMI 30.66 kg/m2  SpO2 95%  Pleasant well nourished female, alert and oriented x 3, in no cardio-pulmonary distress. Afebrile. HEENT No facial trauma or asymetry. Sinuses non tender.  Extra occullar muscles intact, pupils equally reactive to light. External ears normal, tympanic membranes clear. Oropharynx moist, no exudate, poor  dentition. Neck: supple, no adenopathy,JVD or thyromegaly.No bruits.  Chest: Clear to ascultation bilaterally.No crackles or wheezes. Non tender to palpation  Breast: No asymetry,no masses or lumps. No tenderness. No nipple discharge or inversion. No axillary or supraclavicular adenopathy  Cardiovascular system; Heart sounds normal,  S1 and  S2 ,no S3.  No murmur, or thrill. Apical beat not displaced Peripheral pulses normal.  Abdomen: Soft, non tender, no organomegaly or masses. No bruits. Bowel sounds normal. No guarding, tenderness or rebound.  Rectal:  Normal sphincter tone. No mass.No rectal masses.  Guaiac negative stool.  GU: External genitalia normal female genitalia , female distribution of hair. No lesions. Urethral meatus normal in size, no  Prolapse, no lesions visibly  Present. Bladder non tender. Internal digital exam normal but limited due t pt's fear, she denies sexual activity, speculum exam not done   Musculoskeletal exam: Decreased though adequate ROM of spine, hips , shoulders and knees. Deformity and ,swelling or joints in hands and  feet.Left ankle tender to palpation,  No muscle wasting or atrophy.   Neurologic: Cranial nerves 2 to 12 intact.  tone ,sensation and reflexes normal throughout.Reduced grip in both hands due to deformity of joints No disturbance in gait. No tremor.  Skin: Intact, no ulceration or erythema . Scaling  rash noted. Hyperpigmentation noted in patches with very dry skin  Psych; Normal mood and affect. Judgement and concentration normal        Assessment & Plan:  Annual physical exam Annual exam as documented. Counseling done  re healthy lifestyle involving commitment to 150 minutes exercise per week, heart healthy diet, and attaining healthy weight.The importance of adequate sleep also discussed. Regular seat belt use and home safety, is also discussed. Changes in health habits are decided on by the patient with goals and time frames  set for achieving them. Immunization and cancer screening needs are specifically addressed at this visit.   Pruritus geenralized itch, no rash or new product, very dry skin, will mosturize and start hydroxyzine at bedtime  Polyarthritis Uncontrolled.Toradol and depo medrol administered IM in the office , to be followed by a short course of oral prednisone and NSAIDS.

## 2015-07-05 NOTE — Assessment & Plan Note (Signed)
geenralized itch, no rash or new product, very dry skin, will mosturize and start hydroxyzine at bedtime

## 2015-07-21 ENCOUNTER — Other Ambulatory Visit: Payer: Self-pay | Admitting: Family Medicine

## 2015-07-21 DIAGNOSIS — Z1231 Encounter for screening mammogram for malignant neoplasm of breast: Secondary | ICD-10-CM

## 2015-08-01 ENCOUNTER — Ambulatory Visit (HOSPITAL_COMMUNITY)
Admission: RE | Admit: 2015-08-01 | Discharge: 2015-08-01 | Disposition: A | Discharge status: Home / Self Care | Payer: Medicare Other | Source: Ambulatory Visit | Attending: Family Medicine | Admitting: Family Medicine

## 2015-08-01 DIAGNOSIS — Z1231 Encounter for screening mammogram for malignant neoplasm of breast: Secondary | ICD-10-CM | POA: Insufficient documentation

## 2015-09-20 ENCOUNTER — Other Ambulatory Visit: Payer: Self-pay | Admitting: Family Medicine

## 2015-11-02 ENCOUNTER — Encounter: Payer: Self-pay | Admitting: Family Medicine

## 2015-11-02 ENCOUNTER — Ambulatory Visit (INDEPENDENT_AMBULATORY_CARE_PROVIDER_SITE_OTHER): Payer: Medicare Other | Admitting: Family Medicine

## 2015-11-02 VITALS — BP 112/70 | HR 83 | Resp 18 | Ht 60.0 in | Wt 162.1 lb

## 2015-11-02 DIAGNOSIS — M069 Rheumatoid arthritis, unspecified: Secondary | ICD-10-CM

## 2015-11-02 DIAGNOSIS — I10 Essential (primary) hypertension: Secondary | ICD-10-CM

## 2015-11-02 DIAGNOSIS — J3089 Other allergic rhinitis: Secondary | ICD-10-CM

## 2015-11-02 DIAGNOSIS — F29 Unspecified psychosis not due to a substance or known physiological condition: Secondary | ICD-10-CM

## 2015-11-02 DIAGNOSIS — E785 Hyperlipidemia, unspecified: Secondary | ICD-10-CM

## 2015-11-02 DIAGNOSIS — L299 Pruritus, unspecified: Secondary | ICD-10-CM | POA: Diagnosis not present

## 2015-11-02 LAB — HEPATIC FUNCTION PANEL
ALBUMIN: 3.6 g/dL (ref 3.6–5.1)
ALT: 14 U/L (ref 6–29)
AST: 25 U/L (ref 10–35)
Alkaline Phosphatase: 57 U/L (ref 33–130)
BILIRUBIN DIRECT: 0.1 mg/dL (ref <=0.2)
BILIRUBIN TOTAL: 0.5 mg/dL (ref 0.2–1.2)
Indirect Bilirubin: 0.4 mg/dL (ref 0.2–1.2)
Total Protein: 7.3 g/dL (ref 6.1–8.1)

## 2015-11-02 LAB — LIPID PANEL
CHOL/HDL RATIO: 4.8 ratio (ref <=5.0)
CHOLESTEROL: 197 mg/dL (ref 125–200)
HDL: 41 mg/dL — AB (ref >=46)
LDL Cholesterol: 130 mg/dL — ABNORMAL HIGH (ref <130)
Triglycerides: 128 mg/dL (ref <150)
VLDL: 26 mg/dL (ref <30)

## 2015-11-02 MED ORDER — NORTRIPTYLINE HCL 50 MG PO CAPS: ORAL_CAPSULE | ORAL | Status: DC

## 2015-11-02 MED ORDER — CETIRIZINE HCL 10 MG PO TABS: 10.0000 mg | ORAL_TABLET | Freq: Every day | ORAL | Status: DC

## 2015-11-02 MED ORDER — HYDROXYZINE HCL 50 MG PO TABS: ORAL_TABLET | ORAL | Status: DC

## 2015-11-02 NOTE — Patient Instructions (Addendum)
F/u in 5 month, call if you need me before  INCREASE hydroxyzine dose , take TWO 25 mg tabs at night till done, then new dose is 50 mg ONE at night New for itch is zyrtec one daily  Stop Gabapentin, not needed   CALL when  You decide  To see joint specialist, Dr Charlestine Night, for rheumatoid disease of hands, this wILL help you

## 2015-11-02 NOTE — Progress Notes (Signed)
Subjective:    Patient ID: Julia Stevens, female    DOB: 20-Nov-1945, 70 y.o.   MRN: [id number]  HPI   Julia Stevens     MRN: [id number]      DOB: 24-Feb-1946   HPI Julia Stevens is here for follow up and re-evaluation of chronic medical conditions, medication management and review of any available recent lab and radiology data.  Preventive health is updated, specifically  Cancer screening and Immunization.    The PT denies any adverse reactions to current medications since the last visit.  C/o increased weakness and poor function in both hands, as well as nodules, still holding off on rheumatology eval however. C/o increased generalized itching, denies new personal care products or detergents, denies increased anxiety or rash associated with itch. Some benefit from hydroxyzine but not as much as initially   ROS Denies recent fever or chills. Denies sinus pressure, nasal congestion, ear pain or sore throat. Denies chest congestion, productive cough or wheezing. Denies chest pains, palpitations and leg swelling Denies abdominal pain, nausea, vomiting,diarrhea or constipation.   Denies dysuria, frequency, hesitancy or incontinence.  Denies headaches, seizures, numbness, or tingling. Denies depression, anxiety or insomnia.    PE  BP 112/70 mmHg  Pulse 83  Resp 18  Ht 5' (1.524 m)  Wt 162 lb 1.3 oz (73.519 kg)  BMI 31.65 kg/m2  SpO2 95%  Patient alert and oriented and in no cardiopulmonary distress.  HEENT: No facial asymmetry, EOMI,   oropharynx pink and moist.  Neck supple no JVD, no mass.  Chest: Clear to auscultation bilaterally.  CVS: S1, S2 no murmurs, no S3.Regular rate.  ABD: Soft non tender.   Ext: No edema  MS: Adequate ROM spine, shoulders, hips and knees.Marked deformity of joints of both hands, swan neck deformity bilaterally and nodules  Skin: Intact, eczematous  rash noted.  Psych: Good eye contact, normal affect. Memory intact not anxious or  depressed appearing.  CNS: CN 2-12 intact, grade 3 power in hands , otherwise  normal throughout.no focal deficits noted.   Assessment & Plan   HTN, goal below 140/80 Controlled, no change in medication DASH diet and commitment to daily physical activity for a minimum of 30 minutes discussed and encouraged, as a part of hypertension management. The importance of attaining a healthy weight is also discussed.  BP/Weight 11/02/2015 07/05/2015 02/02/2015 09/01/2014 03/01/2014 02/26/2014 123XX123  Systolic BP XX123456 123456 [id number] Q000111Q 123456  Diastolic BP 70 82 72 80 82 88 95  Wt. (Lbs) 162.08 157 152.4 148 144.12 147 147  BMI 31.65 30.66 29.53 27.06 26.79 27.33 26.88        Allergic rhinitis Controlled, no change in medication   Rheumatoid arthritis of hand Worsening due to lack of definitive treatment which pr remains undecided about. Notes weakness and loss of function in her hands due to poor grip from joint disease, she has been formally diagnosed in the past and is willing to return to the rheumatologist who she has seen in the past once she decides to get treatment . She needs this  Hyperlipidemia LDL goal <100 dteriorated  Hyperlipidemia:Low fat diet discussed and encouraged.   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

## 2015-11-03 LAB — TSH: TSH: 1.86 mIU/L

## 2015-11-06 NOTE — Assessment & Plan Note (Signed)
Worsening due to lack of definitive treatment which pr remains undecided about. Notes weakness and loss of function in her hands due to poor grip from joint disease, she has been formally diagnosed in the past and is willing to return to the rheumatologist who she has seen in the past once she decides to get treatment . She needs this

## 2015-11-06 NOTE — Assessment & Plan Note (Signed)
stable and controlled on current medication, continue same

## 2015-11-06 NOTE — Assessment & Plan Note (Signed)
Uncontrolled, increase hydroxyzine dose and add zyrtec

## 2015-11-06 NOTE — Assessment & Plan Note (Signed)
Controlled, no change in medication  

## 2015-11-06 NOTE — Assessment & Plan Note (Signed)
Controlled, no change in medication DASH diet and commitment to daily physical activity for a minimum of 30 minutes discussed and encouraged, as a part of hypertension management. The importance of attaining a healthy weight is also discussed.  BP/Weight 11/02/2015 07/05/2015 02/02/2015 09/01/2014 03/01/2014 02/26/2014 [id number]  Systolic BP XX123456 123456 [id number] 123456 [id number] Q000111Q 123456  Diastolic BP 70 82 72 80 82 88 95  Wt. (Lbs) 162.08 157 152.4 148 144.12 147 147  BMI 31.65 30.66 29.53 27.06 26.79 27.33 26.88

## 2015-11-06 NOTE — Assessment & Plan Note (Signed)
dteriorated  Hyperlipidemia:Low fat diet discussed and encouraged.   Lipid Panel  Lab Results  Component Value Date   CHOL 197 11/02/2015   HDL 41* 11/02/2015   LDLCALC 130* 11/02/2015   TRIG 128 11/02/2015   CHOLHDL 4.8 11/02/2015

## 2016-01-24 ENCOUNTER — Other Ambulatory Visit: Payer: Self-pay | Admitting: Family Medicine

## 2016-03-15 ENCOUNTER — Other Ambulatory Visit: Payer: Self-pay | Admitting: Family Medicine

## 2016-03-15 DIAGNOSIS — L299 Pruritus, unspecified: Secondary | ICD-10-CM

## 2016-03-20 ENCOUNTER — Other Ambulatory Visit: Payer: Self-pay | Admitting: Family Medicine

## 2016-04-02 ENCOUNTER — Ambulatory Visit (INDEPENDENT_AMBULATORY_CARE_PROVIDER_SITE_OTHER): Payer: Medicare Other | Admitting: Family Medicine

## 2016-04-02 ENCOUNTER — Encounter: Payer: Self-pay | Admitting: Family Medicine

## 2016-04-02 VITALS — BP 132/82 | HR 86 | Resp 16 | Ht 60.0 in | Wt 159.0 lb

## 2016-04-02 DIAGNOSIS — I10 Essential (primary) hypertension: Secondary | ICD-10-CM | POA: Diagnosis not present

## 2016-04-02 DIAGNOSIS — L309 Dermatitis, unspecified: Secondary | ICD-10-CM | POA: Diagnosis not present

## 2016-04-02 DIAGNOSIS — F29 Unspecified psychosis not due to a substance or known physiological condition: Secondary | ICD-10-CM

## 2016-04-02 DIAGNOSIS — R32 Unspecified urinary incontinence: Secondary | ICD-10-CM | POA: Diagnosis not present

## 2016-04-02 DIAGNOSIS — J3089 Other allergic rhinitis: Secondary | ICD-10-CM

## 2016-04-02 DIAGNOSIS — E785 Hyperlipidemia, unspecified: Secondary | ICD-10-CM

## 2016-04-02 DIAGNOSIS — L299 Pruritus, unspecified: Secondary | ICD-10-CM

## 2016-04-02 DIAGNOSIS — E559 Vitamin D deficiency, unspecified: Secondary | ICD-10-CM | POA: Diagnosis not present

## 2016-04-02 LAB — BASIC METABOLIC PANEL
BUN: 14 mg/dL (ref 7–25)
CHLORIDE: 105 mmol/L (ref 98–110)
CO2: 27 mmol/L (ref 20–31)
CREATININE: 1.16 mg/dL — AB (ref 0.50–0.99)
Calcium: 9.4 mg/dL (ref 8.6–10.4)
Glucose, Bld: 90 mg/dL (ref 65–99)
POTASSIUM: 4.5 mmol/L (ref 3.5–5.3)
SODIUM: 140 mmol/L (ref 135–146)

## 2016-04-02 LAB — CBC
HEMATOCRIT: 36.2 % (ref 35.0–45.0)
HEMOGLOBIN: 12.2 g/dL (ref 11.7–15.5)
MCH: 30 pg (ref 27.0–33.0)
MCHC: 33.7 g/dL (ref 32.0–36.0)
MCV: 88.9 fL (ref 80.0–100.0)
MPV: 10 fL (ref 7.5–12.5)
PLATELETS: 231 10*3/uL (ref 140–400)
RBC: 4.07 MIL/uL (ref 3.80–5.10)
RDW: 14.8 % (ref 11.0–15.0)
WBC: 4 10*3/uL (ref 3.8–10.8)

## 2016-04-02 LAB — POCT URINALYSIS DIPSTICK
Bilirubin, UA: NEGATIVE
Blood, UA: NEGATIVE
Glucose, UA: NEGATIVE
Ketones, UA: NEGATIVE
LEUKOCYTES UA: NEGATIVE
NITRITE UA: NEGATIVE
PH UA: 7.5
Spec Grav, UA: 1.02
UROBILINOGEN UA: 0.2

## 2016-04-02 MED ORDER — BETAMETHASONE VALERATE 0.1 % EX OINT: TOPICAL_OINTMENT | CUTANEOUS | 0 refills | Status: DC

## 2016-04-02 MED ORDER — HYDROXYZINE HCL 50 MG PO TABS: 50.0000 mg | ORAL_TABLET | Freq: Every day | ORAL | 1 refills | Status: DC

## 2016-04-02 MED ORDER — TRIAMTERENE-HCTZ 37.5-25 MG PO TABS: 1.0000 | ORAL_TABLET | Freq: Every day | ORAL | 2 refills | Status: DC

## 2016-04-02 NOTE — Assessment & Plan Note (Signed)
Hyperlipidemia:Low fat diet discussed and encouraged.   Lipid Panel  Lab Results  Component Value Date   CHOL 197 11/02/2015   HDL 41 (L) 11/02/2015   LDLCALC 130 (H) 11/02/2015   TRIG 128 11/02/2015   CHOLHDL 4.8 11/02/2015   Updated lab needed at/ before next visit.

## 2016-04-02 NOTE — Assessment & Plan Note (Signed)
Controlled, no change in medication  

## 2016-04-02 NOTE — Patient Instructions (Addendum)
Annual wellness in 3.5 month, early dec call if you need me sooner  Ointment sent for use on right forearm  Labs today, cBC, chem 7 and vit D  Antibiotic oint for twice daily use to scab provided  Thank you  for choosing [facility]. We consider it a privelige to serve you.  Delivering excellent health care in a caring and  compassionate way is our goal.  Partnering with you,  so that together we can achieve this goal is our strategy.

## 2016-04-02 NOTE — Assessment & Plan Note (Signed)
Controlled, no change in medication DASH diet and commitment to daily physical activity for a minimum of 30 minutes discussed and encouraged, as a part of hypertension management. The importance of attaining a healthy weight is also discussed.  BP/Weight 04/02/2016 11/02/2015 07/05/2015 02/02/2015 09/01/2014 03/01/2014 Q000111Q  Systolic BP Q000111Q [id number] 123456 [id number] [id number] [id number] Q000111Q  Diastolic BP 82 70 82 72 80 82 88  Wt. (Lbs) 159 162.08 157 152.4 148 144.12 147  BMI 31.05 31.65 30.66 29.53 27.06 26.79 27.33

## 2016-04-02 NOTE — Progress Notes (Signed)
   Julia Stevens     MRN: [id number]      DOB: Jul 19, 1946   HPI Ms. Hunsaker is here for follow up and re-evaluation of chronic medical conditions, medication management and review of any available recent lab and radiology data.  Preventive health is updated, specifically  Cancer screening and Immunization.   Questions or concerns regarding consultations or procedures which the PT has had in the interim are  addressed. The PT denies any adverse reactions to current medications since the last visit.  C/o scab on lower abdomen   ROS Denies recent fever or chills. Denies sinus pressure, nasal congestion, ear pain or sore throat. Denies chest congestion, productive cough or wheezing. Denies chest pains, palpitations and leg swelling Denies abdominal pain, nausea, vomiting,diarrhea or constipation.   Denies dysuria, frequency, hesitancy . Denies joint pain, swelling and limitation in mobility. Denies headaches, seizures, numbness, or tingling. Denies depression, anxiety or insomnia  PE  BP 132/82   Pulse 86   Resp 16   Ht 5' (1.524 m)   Wt 159 lb (72.1 kg)   SpO2 97%   BMI 31.05 kg/m   Patient alert and oriented and in no cardiopulmonary distress.  HEENT: No facial asymmetry, EOMI,   oropharynx pink and moist.  Neck supple no JVD, no mass.  Chest: Clear to auscultation bilaterally.  CVS: S1, S2 no murmurs, no S3.Regular rate.  ABD: Soft non tender.   Ext: No edema  MS: Adequate ROM spine, shoulders, hips and knees. Marked deformity of I{P joints of all digits. Skin: Intact, keloids on anterior abdomen lower aspect has scab which pt has been picking, no blood or drainage, she is advised to d/c same  Psych: Good eye contact, normal affect. Memory intact not anxious or depressed appearing.  CNS: CN 2-12 intact, power,  normal throughout.no focal deficits noted.   Assessment & Plan  Dermatitis Flare of hyperpigmented skin on right posterior forearm, topical steroid  twice daily  HTN, goal below 140/80 Controlled, no change in medication DASH diet and commitment to daily physical activity for a minimum of 30 minutes discussed and encouraged, as a part of hypertension management. The importance of attaining a healthy weight is also discussed.  BP/Weight 04/02/2016 11/02/2015 07/05/2015 02/02/2015 09/01/2014 03/01/2014 Q000111Q  Systolic BP Q000111Q [id number] 123456 [id number] 123456 [id number] [id number]  Diastolic BP 82 70 82 72 80 82 88  Wt. (Lbs) 159 162.08 157 152.4 148 144.12 147  BMI 31.05 31.65 30.66 29.53 27.06 26.79 27.33       Hyperlipidemia LDL goal <100 Hyperlipidemia:Low fat diet discussed and encouraged.   Lipid Panel  Lab Results  Component Value Date   CHOL 197 11/02/2015   HDL 41 (L) 11/02/2015   LDLCALC 130 (H) 11/02/2015   TRIG 128 11/02/2015   CHOLHDL 4.8 11/02/2015   Updated lab needed at/ before next visit.    Pruritus Controlled, no change in medication   Nonorganic psychosis Controlled, no change in medication   Allergic rhinitis Controlled, no change in medication   Incontinence Urinalysis checked at [facility], no sign of infection

## 2016-04-02 NOTE — Assessment & Plan Note (Addendum)
Flare of hyperpigmented skin on right posterior forearm, topical steroid twice daily

## 2016-04-02 NOTE — Assessment & Plan Note (Signed)
Urinalysis checked at [facility], no sign of infection

## 2016-04-03 LAB — VITAMIN D 25 HYDROXY (VIT D DEFICIENCY, FRACTURES): Vit D, 25-Hydroxy: 33 ng/mL (ref 30–100)

## 2016-05-21 ENCOUNTER — Other Ambulatory Visit: Payer: Self-pay | Admitting: Family Medicine

## 2016-05-21 DIAGNOSIS — L299 Pruritus, unspecified: Secondary | ICD-10-CM

## 2016-06-16 ENCOUNTER — Other Ambulatory Visit: Payer: Self-pay | Admitting: Family Medicine

## 2016-07-18 ENCOUNTER — Other Ambulatory Visit: Payer: Self-pay | Admitting: Family Medicine

## 2016-07-18 DIAGNOSIS — L299 Pruritus, unspecified: Secondary | ICD-10-CM

## 2016-07-20 ENCOUNTER — Ambulatory Visit (INDEPENDENT_AMBULATORY_CARE_PROVIDER_SITE_OTHER): Payer: Medicare Other

## 2016-07-20 VITALS — BP 126/82 | HR 80 | Resp 18 | Ht 60.0 in | Wt 162.0 lb

## 2016-07-20 DIAGNOSIS — Z23 Encounter for immunization: Secondary | ICD-10-CM

## 2016-07-20 DIAGNOSIS — Z0000 Encounter for general adult medical examination without abnormal findings: Secondary | ICD-10-CM | POA: Diagnosis not present

## 2016-07-20 DIAGNOSIS — Z1239 Encounter for other screening for malignant neoplasm of breast: Secondary | ICD-10-CM

## 2016-07-20 NOTE — Patient Instructions (Signed)
Thank you for choosing [facility] for your health care needs  The Annual Wellness Visit is designed to allow [place] the chance to assist you in preserving and improving you health.   Dr. Moshe Cipro will see you back in February for your next visit  If you need blood work I will send the order to you in the mail  If you have any questions feel free to contact the office.    The medicine for itching is Hydroxyzine   The medicine for sneezing and coughing is Cetirizine

## 2016-07-20 NOTE — Progress Notes (Signed)
Subjective:    Julia Stevens is a 70 y.o. female who presents for Medicare Annual/Subsequent preventive examination.  Preventive Screening-Counseling & Management  Tobacco History  Smoking Status  . Never Smoker  Smokeless Tobacco  . Never Used        Current Problems (verified) Patient Active Problem List   Diagnosis Date Noted  . Incontinence 04/02/2016  . Polyarthritis 07/05/2015  . Pruritus 07/05/2015  . Dermatitis 02/02/2015  . Hyperlipidemia LDL goal <100 09/01/2014  . Leukopenia 07/28/2012  . Dermatomycosis of foot 03/24/2012  . Allergic rhinitis 04/29/2009  . Rheumatoid arthritis of hand ([place]) 06/03/2008  . 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 HTN, goal below 140/80 12/26/2007  . Eczema of both hands 12/26/2007  . OTHER DYSPHAGIA 12/26/2007    Medications Prior to Visit Current Outpatient Prescriptions on File Prior to Visit  Medication Sig Dispense Refill  . aspirin EC 81 MG tablet Take 81 mg by mouth daily.    . betamethasone valerate ointment (VALISONE) 0.1 % Apply twice daily to rash on right forearm, as needed 45 g 0  . calcium carbonate (OS-CAL) 600 MG TABS tablet Take 600 mg by mouth 2 (two) times daily with a meal.    . cetirizine (ZYRTEC) 10 MG tablet TAKE ONE TABLET BY MOUTH DAILY. 90 tablet 1  . diltiazem (CARDIZEM CD) 240 MG 24 hr capsule TAKE (1) CAPSULE BY MOUTH ONCE DAILY. 30 capsule 3  . hydrOXYzine (ATARAX/VISTARIL) 50 MG tablet TAKE ONE TABLET BY MOUTH AT BEDTIME. 90 tablet 1  . Multiple Vitamin (MULTIVITAMIN) capsule Take 1 capsule by mouth daily.      . nortriptyline (PAMELOR) 50 MG capsule TAKE 2 CAPSULES BY MOUTH AT BEDTIME. 60 capsule 5  . potassium chloride (KLOR-CON 10) 10 MEQ tablet Take 1 tablet (10 mEq total) by mouth 2 (two) times daily. 60 tablet 5  . triamterene-hydrochlorothiazide (MAXZIDE-25) 37.5-25 MG tablet Take 1 tablet by mouth daily. 30 tablet 2   No current facility-administered medications on file prior to visit.      Current Medications (verified) Current Outpatient Prescriptions  Medication Sig Dispense Refill  . aspirin EC 81 MG tablet Take 81 mg by mouth daily.    . betamethasone valerate ointment (VALISONE) 0.1 % Apply twice daily to rash on right forearm, as needed 45 g 0  . calcium carbonate (OS-CAL) 600 MG TABS tablet Take 600 mg by mouth 2 (two) times daily with a meal.    . cetirizine (ZYRTEC) 10 MG tablet TAKE ONE TABLET BY MOUTH DAILY. 90 tablet 1  . diltiazem (CARDIZEM CD) 240 MG 24 hr capsule TAKE (1) CAPSULE BY MOUTH ONCE DAILY. 30 capsule 3  . hydrOXYzine (ATARAX/VISTARIL) 50 MG tablet TAKE ONE TABLET BY MOUTH AT BEDTIME. 90 tablet 1  . Multiple Vitamin (MULTIVITAMIN) capsule Take 1 capsule by mouth daily.      . nortriptyline (PAMELOR) 50 MG capsule TAKE 2 CAPSULES BY MOUTH AT BEDTIME. 60 capsule 5  . potassium chloride (KLOR-CON 10) 10 MEQ tablet Take 1 tablet (10 mEq total) by mouth 2 (two) times daily. 60 tablet 5  . triamterene-hydrochlorothiazide (MAXZIDE-25) 37.5-25 MG tablet Take 1 tablet by mouth daily. 30 tablet 2   No current facility-administered medications for this visit.      Allergies (verified) Ace inhibitors; Ciprofloxacin; and Penicillins   PAST HISTORY  Family History Family History  Problem Relation Age of Onset  . Hypertension Mother   . Heart disease Mother     CAD  . Hypertension  Father      Severe DJD/ macrobacterium  . Hypertension Sister   . Hypertension Sister   . Fibromyalgia Sister   . Diabetes Brother   . Colon cancer Neg Hx     Social History Social History  Substance Use Topics  . Smoking status: Never Smoker  . Smokeless tobacco: Never Used  . Alcohol use No     Are there smokers in your home (other than you)? No  Risk Factors Current exercise habits: The patient does not participate in regular exercise at present.  Dietary issues discussed: Changes in food labels and incorporating variety into diet    Cardiac risk factors:  advanced age (older than 69 for men, 38 for women), hypertension, obesity (BMI >= 30 kg/m2) and sedentary lifestyle.  Depression Screen (Note: if answer to either of the following is "Yes", a more complete depression screening is indicated)   Over the past two weeks, have you felt down, depressed or hopeless? No  Over the past two weeks, have you felt little interest or pleasure in doing things? No  Have you lost interest or pleasure in daily life? No  Do you often feel hopeless? No  Do you cry easily over simple problems? No  Activities of Daily Living In your present state of health, do you have any difficulty performing the following activities?:  Driving? Yes Managing money?  No Feeding yourself? No Getting from bed to chair? No Climbing a flight of stairs? No Preparing food and eating?: No Bathing or showering? No Getting dressed: No Getting to the toilet? No Using the toilet:No Moving around from place to place: No In the past year have you fallen or had a near fall?:No but has instability of gait at times    Are you sexually active?  No  Do you have more than one partner?  NA  Hearing Difficulties: No Do you often ask people to speak up or repeat themselves? No Do you experience ringing or noises in your ears? No Do you have difficulty understanding soft or whispered voices? No   Do you feel that you have a problem with memory? Yes  Do you often misplace items? Yes  Do you feel safe at home?  Yes  Cognitive Testing  Alert? Yes  Normal Appearance?Yes  Oriented to person? Yes  Place? Yes   Time? Yes  Recall of three objects?  Yes  Can perform simple calculations? Yes  Displays appropriate judgment?Yes  Can read the correct time from a watch face?Yes   Advanced Directives have been discussed with the patient? No  List the Names of Other Physician/Practitioners you currently use: 1.  [facility] Milus Glazier)   Indicate any recent Medical Services you may have  received from other than [facility] providers in the past year (date may be approximate).  Immunization History  Administered Date(s) Administered  . 19-influenza Whole 07/23/2011  . Influenza Split 07/28/2012, 06/22/2014  . Influenza Whole 07/04/2007, 06/29/2009, 07/10/2010  . Influenza,inj,Quad PF,36+ Mos 05/20/2013, 07/05/2015, 07/20/2016  . Pneumococcal Conjugate-13 03/01/2014  . Pneumococcal Polysaccharide-23 08/21/2013  . Td 01/04/2004  . Tdap 07/23/2011  . Zoster 09/22/2013    Screening Tests Health Maintenance  Topic Date Due  . MAMMOGRAM  07/31/2017  . TETANUS/TDAP  07/22/2021  . COLONOSCOPY  12/26/2023  . INFLUENZA VACCINE  Completed  . DEXA SCAN  Completed  . ZOSTAVAX  Completed  . Hepatitis C Screening  Completed  . PNA vac Low Risk Adult  Completed  All answers were reviewed with the patient and necessary referrals were made:  Vanetta Mulders, LPN   [id number]   History reviewed: allergies, current medications, past family history, past medical history, past social history, past surgical history and problem list  Review of Systems A comprehensive review of systems was negative.    Objective:     Vision by Snellen chart: right eye:20/30, left eye:20/30  Body mass index is 31.65 kg/m. BP 126/82   Pulse 80   Resp 18   Ht 5' (1.524 m)   Wt 162 lb 0.6 oz (73.5 kg)   SpO2 98%   BMI 31.65 kg/m   No exam performed today, annual wellness without phyiscial exam.     Assessment:   Medicare annual wellness visit, subsequent Annual exam as documented. Counseling done  re healthy lifestyle involving commitment to 150 minutes exercise per week, heart healthy diet, and attaining healthy weight.The importance of adequate sleep also discussed. Regular seat belt use and home safety, is also discussed. Changes in health habits are decided on by the patient with goals and time frames  set for achieving them. Immunization and cancer screening needs are  specifically addressed at this visit.   Need for prophylactic vaccination and inoculation against influenza After obtaining informed consent, the vaccine is  administered by LPN.        Plan:     During the course of the visit the patient was educated and counseled about appropriate screening and preventive services including:    Influenza vaccine  Nutrition counseling    Breast cancer screening    Diet review for nutrition referral? Yes ____  Not Indicated __x__   Patient Instructions (the written plan) was given to the patient.  Medicare Attestation I have personally reviewed: The patient's medical and social history Their use of alcohol, tobacco or illicit drugs Their current medications and supplements The patient's functional ability including ADLs,fall risks, home safety risks, cognitive, and hearing and visual impairment Diet and physical activities Evidence for depression or mood disorders  The patient's weight, height, BMI, and visual acuity have been recorded in the chart.  I have made referrals, counseling, and provided education to the patient based on review of the above and I have provided the patient with a written personalized care plan for preventive services.     Denman George [place], [place]   [id number]

## 2016-07-22 DIAGNOSIS — Z23 Encounter for immunization: Secondary | ICD-10-CM | POA: Insufficient documentation

## 2016-07-22 NOTE — Assessment & Plan Note (Signed)

## 2016-07-22 NOTE — Assessment & Plan Note (Signed)
After obtaining informed consent, the vaccine is  administered by LPN.  

## 2016-09-14 ENCOUNTER — Ambulatory Visit (HOSPITAL_COMMUNITY)
Admission: RE | Admit: 2016-09-14 | Discharge: 2016-09-14 | Disposition: A | Discharge status: Home / Self Care | Payer: Medicare Other | Source: Ambulatory Visit | Attending: Family Medicine | Admitting: Family Medicine

## 2016-09-14 DIAGNOSIS — Z1239 Encounter for other screening for malignant neoplasm of breast: Secondary | ICD-10-CM

## 2016-09-14 DIAGNOSIS — Z1231 Encounter for screening mammogram for malignant neoplasm of breast: Secondary | ICD-10-CM | POA: Diagnosis not present

## 2016-09-24 ENCOUNTER — Other Ambulatory Visit: Payer: Self-pay | Admitting: Family Medicine

## 2016-10-03 ENCOUNTER — Encounter: Payer: Self-pay | Admitting: Family Medicine

## 2016-10-03 ENCOUNTER — Ambulatory Visit (INDEPENDENT_AMBULATORY_CARE_PROVIDER_SITE_OTHER): Payer: Medicare Other | Admitting: Family Medicine

## 2016-10-03 VITALS — BP 114/80 | HR 78 | Resp 15 | Ht 60.0 in | Wt 164.0 lb

## 2016-10-03 DIAGNOSIS — F29 Unspecified psychosis not due to a substance or known physiological condition: Secondary | ICD-10-CM

## 2016-10-03 DIAGNOSIS — M859 Disorder of bone density and structure, unspecified: Secondary | ICD-10-CM

## 2016-10-03 DIAGNOSIS — I10 Essential (primary) hypertension: Secondary | ICD-10-CM

## 2016-10-03 DIAGNOSIS — E785 Hyperlipidemia, unspecified: Secondary | ICD-10-CM | POA: Diagnosis not present

## 2016-10-03 DIAGNOSIS — L309 Dermatitis, unspecified: Secondary | ICD-10-CM

## 2016-10-03 DIAGNOSIS — Z1211 Encounter for screening for malignant neoplasm of colon: Secondary | ICD-10-CM

## 2016-10-03 LAB — BASIC METABOLIC PANEL
BUN: 19 mg/dL (ref 7–25)
CALCIUM: 9.4 mg/dL (ref 8.6–10.4)
CHLORIDE: 103 mmol/L (ref 98–110)
CO2: 27 mmol/L (ref 20–31)
Creat: 1.17 mg/dL — ABNORMAL HIGH (ref 0.50–0.99)
Glucose, Bld: 95 mg/dL (ref 65–99)
Potassium: 3.9 mmol/L (ref 3.5–5.3)
SODIUM: 139 mmol/L (ref 135–146)

## 2016-10-03 LAB — LIPID PANEL
Cholesterol: 216 mg/dL — ABNORMAL HIGH (ref <200)
HDL: 49 mg/dL — AB (ref >50)
LDL CALC: 144 mg/dL — AB (ref <100)
Total CHOL/HDL Ratio: 4.4 Ratio (ref <5.0)
Triglycerides: 114 mg/dL (ref <150)
VLDL: 23 mg/dL (ref <30)

## 2016-10-03 NOTE — Assessment & Plan Note (Signed)
Controlled, no change in medication  

## 2016-10-03 NOTE — Patient Instructions (Addendum)
F/u in September, call if you need me sooner.  Bactroban ointment to be used twice daily to cut on finget of left hand for 5 days  Labs today lipid, chem 7 and vit D  It is important that you exercise regularly at least 30 minutes 5 times a week. If you develop chest pain, have severe difficulty breathing, or feel very tired, stop exercising immediately and seek medical attention  Thank you  for choosing [facility]. We consider it a privelige to serve you.  Delivering excellent health care in a caring and  compassionate way is our goal.  Partnering with you,  so that together we can achieve this goal is our strategy.

## 2016-10-03 NOTE — Progress Notes (Signed)
   Julia Stevens     MRN: [id number]      DOB: Jan 17, 1946   HPI Julia Stevens is here for follow up and re-evaluation of chronic medical conditions, medication management and review of any available recent lab and radiology data.  Preventive health is updated, specifically  Cancer screening and Immunization.   . The PT denies any adverse reactions to current medications since the last visit.  Recently traumatized skin of left index, so split in fold, however healing well.  ROS Denies recent fever or chills. Denies sinus pressure, nasal congestion, ear pain or sore throat. Denies chest congestion, productive cough or wheezing. Denies chest pains, palpitations and leg swelling Denies abdominal pain, nausea, vomiting,diarrhea or constipation.   Denies dysuria, frequency, hesitancy or incontinence. Denies joint pain, swelling and limitation in mobility. Denies headaches, seizures, numbness, or tingling. Denies depression, uncontrolled  anxiety or insomnia.  PE  BP 114/80   Pulse 78   Resp 15   Ht 5' (1.524 m)   Wt 164 lb (74.4 kg)   SpO2 97%   BMI 32.03 kg/m   Patient alert and oriented and in no cardiopulmonary distress.  HEENT: No facial asymmetry, EOMI,   oropharynx pink and moist.  Neck supple no JVD, no mass.  Chest: Clear to auscultation bilaterally.  CVS: S1, S2 no murmurs, no S3.Regular rate.  ABD: Soft non tender. No organomegaly or mass. Rectal: no mass, heme negative stool  Ext: No edema  MS: Adequate ROM spine, shoulders, hips and knees. Marked deformity of joints in fingers Skin: healing ulceration in skin fold of left index finger  Psych: Good eye contact, normal affect. Memory intact not anxious or depressed appearing.  CNS: CN 2-12 intact, power,  normal throughout.no focal deficits noted.   Assessment & Plan  HTN, goal below 140/80 Controlled, no change in medication DASH diet and commitment to daily physical activity for a minimum of 30 minutes  discussed and encouraged, as a part of hypertension management. The importance of attaining a healthy weight is also discussed.  BP/Weight 10/03/2016 07/20/2016 04/02/2016 11/02/2015 07/05/2015 02/02/2015 [id number]  Systolic BP 99991111 [id number] Q000111Q [id number] 123456 [id number] [id number]  Diastolic BP 80 82 82 70 82 72 80  Wt. (Lbs) 164 162.04 159 162.08 157 152.4 148  BMI 32.03 31.65 31.05 31.65 30.66 29.53 27.06       Nonorganic psychosis Controlled, no change in medication   Colon cancer screening Rectal no mass and heme negative stool  Eczema of both hands Controlled, no change in medication   Hyperlipidemia LDL goal <100 Hyperlipidemia:Low fat diet discussed and encouraged.   Lipid Panel  Lab Results  Component Value Date   CHOL 216 (H) 10/03/2016   HDL 49 (L) 10/03/2016   LDLCALC 144 (H) 10/03/2016   TRIG 114 10/03/2016   CHOLHDL 4.4 10/03/2016   Deteriorated needs to reduce fat intake and commit to exercise, no medication to be added at this time

## 2016-10-03 NOTE — Assessment & Plan Note (Signed)
Hyperlipidemia:Low fat diet discussed and encouraged.   Lipid Panel  Lab Results  Component Value Date   CHOL 216 (H) 10/03/2016   HDL 49 (L) 10/03/2016   LDLCALC 144 (H) 10/03/2016   TRIG 114 10/03/2016   CHOLHDL 4.4 10/03/2016   Deteriorated needs to reduce fat intake and commit to exercise, no medication to be added at this time

## 2016-10-03 NOTE — Assessment & Plan Note (Signed)
Controlled, no change in medication DASH diet and commitment to daily physical activity for a minimum of 30 minutes discussed and encouraged, as a part of hypertension management. The importance of attaining a healthy weight is also discussed.  BP/Weight 10/03/2016 07/20/2016 04/02/2016 11/02/2015 07/05/2015 02/02/2015 [id number]  Systolic BP 99991111 [id number] Q000111Q [id number] 123456 [id number] 123456  Diastolic BP 80 82 82 70 82 72 80  Wt. (Lbs) 164 162.04 159 162.08 157 152.4 148  BMI 32.03 31.65 31.05 31.65 30.66 29.53 27.06

## 2016-10-03 NOTE — Assessment & Plan Note (Signed)
Rectal no mass and heme negative stool

## 2016-10-04 LAB — VITAMIN D 25 HYDROXY (VIT D DEFICIENCY, FRACTURES): VIT D 25 HYDROXY: 33 ng/mL (ref 30–100)

## 2016-10-05 LAB — POC HEMOCCULT BLD/STL (OFFICE/1-CARD/DIAGNOSTIC): FECAL OCCULT BLD: NEGATIVE

## 2016-10-05 NOTE — Addendum Note (Signed)
Addended by: Eual Fines on: 10/05/2016 08:26 AM   Modules accepted: Orders

## 2016-11-21 ENCOUNTER — Other Ambulatory Visit: Payer: Self-pay | Admitting: Family Medicine

## 2016-12-18 ENCOUNTER — Other Ambulatory Visit: Payer: Self-pay | Admitting: Family Medicine

## 2017-01-22 ENCOUNTER — Other Ambulatory Visit: Payer: Self-pay | Admitting: Family Medicine

## 2017-01-22 DIAGNOSIS — L299 Pruritus, unspecified: Secondary | ICD-10-CM

## 2017-02-15 ENCOUNTER — Emergency Department (HOSPITAL_COMMUNITY)
Admission: EM | Admit: 2017-02-15 | Discharge: 2017-02-15 | Disposition: A | Discharge status: Home / Self Care | Payer: Medicare Other | Attending: Emergency Medicine | Admitting: Emergency Medicine

## 2017-02-15 ENCOUNTER — Encounter (HOSPITAL_COMMUNITY): Payer: Self-pay | Admitting: Emergency Medicine

## 2017-02-15 DIAGNOSIS — N3000 Acute cystitis without hematuria: Secondary | ICD-10-CM

## 2017-02-15 DIAGNOSIS — Z79899 Other long term (current) drug therapy: Secondary | ICD-10-CM | POA: Insufficient documentation

## 2017-02-15 DIAGNOSIS — I10 Essential (primary) hypertension: Secondary | ICD-10-CM | POA: Diagnosis not present

## 2017-02-15 DIAGNOSIS — Z7982 Long term (current) use of aspirin: Secondary | ICD-10-CM | POA: Insufficient documentation

## 2017-02-15 DIAGNOSIS — R350 Frequency of micturition: Secondary | ICD-10-CM | POA: Diagnosis present

## 2017-02-15 LAB — URINALYSIS, ROUTINE W REFLEX MICROSCOPIC
Bilirubin Urine: NEGATIVE
GLUCOSE, UA: NEGATIVE mg/dL
Hgb urine dipstick: NEGATIVE
Ketones, ur: NEGATIVE mg/dL
NITRITE: POSITIVE — AB
PH: 5 (ref 5.0–8.0)
PROTEIN: 30 mg/dL — AB
Specific Gravity, Urine: 1.02 (ref 1.005–1.030)
Squamous Epithelial / LPF: NONE SEEN

## 2017-02-15 MED ORDER — SULFAMETHOXAZOLE-TRIMETHOPRIM 800-160 MG PO TABS
1.0000 | ORAL_TABLET | Freq: Once | ORAL | Status: AC
  Administered 2017-02-15: 1 via ORAL
  Filled 2017-02-15: qty 1

## 2017-02-15 MED ORDER — SULFAMETHOXAZOLE-TRIMETHOPRIM 800-160 MG PO TABS: 1.0000 | ORAL_TABLET | Freq: Two times a day (BID) | ORAL | 0 refills | Status: AC

## 2017-02-15 NOTE — ED Triage Notes (Addendum)
PT c/o urinary burning and increased frequency for about 2 weeks with intermittent nausea. PT states hx of UTIs.

## 2017-02-15 NOTE — ED Provider Notes (Signed)
[place] DEPT Provider Note   CSN: [id number] Arrival date & time: 02/15/17  1303     History   Chief Complaint Chief Complaint  Patient presents with  . Urinary Frequency    HPI Julia Stevens is a 71 y.o. female.  Patient complains of dysuria and frequency for 2 weeks. She also has some right flank pain   The history is provided by the patient. No language interpreter was used.  Urinary Frequency  This is a new problem. The current episode started more than 1 week ago. The problem occurs constantly. The problem has not changed since onset.Pertinent negatives include no chest pain, no abdominal pain and no headaches. Nothing aggravates the symptoms. Nothing relieves the symptoms.    Past Medical History:  Diagnosis Date  . Arthritis   . Dysphagia   . Eczema   . Hyperlipidemia   . Hypertension   . Psychosis   . Scleroderma ([place])   . Shingles     Patient Active Problem List   Diagnosis Date Noted  . Colon cancer screening 10/03/2016  . Polyarthritis 07/05/2015  . Pruritus 07/05/2015  . Hyperlipidemia LDL goal <100 09/01/2014  . Allergic rhinitis 04/29/2009  . Rheumatoid arthritis of hand ([place]) 06/03/2008  . 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 HTN, goal below 140/80 12/26/2007  . Eczema of both hands 12/26/2007    Past Surgical History:  Procedure Laterality Date  . COLONOSCOPY  06/26/2000   [name],   . COLONOSCOPY N/A 12/25/2013   Procedure: COLONOSCOPY;  Surgeon: Danie Binder, MD;  Location: [facility];  Service: Endoscopy;  Laterality: N/A;  10:45AM  . TOTAL ABDOMINAL HYSTERECTOMY W/ BILATERAL SALPINGOOPHORECTOMY  1994    OB History    No data available       Home Medications    Prior to Admission medications   Medication Sig Start Date End Date Taking? Authorizing Provider  aspirin EC 81 MG tablet Take 81 mg by mouth daily.   Yes [provider]  calcium carbonate (OS-CAL) 600 MG TABS tablet Take 600 mg by mouth 2 (two) times  daily with a meal.   Yes [provider]  cetirizine (ZYRTEC) 10 MG tablet TAKE ONE TABLET BY MOUTH DAILY. 07/18/16  Yes Fayrene Helper, MD  diltiazem (CARDIZEM CD) 240 MG 24 hr capsule TAKE (1) CAPSULE BY MOUTH ONCE DAILY. 11/21/16  Yes Fayrene Helper, MD  hydrOXYzine (ATARAX/VISTARIL) 50 MG tablet TAKE (1) TABLET BY MOUTH AT BEDTIME. 01/23/17  Yes Fayrene Helper, MD  Multiple Vitamin (MULTIVITAMIN) capsule Take 1 capsule by mouth daily.     Yes [provider]  nortriptyline (PAMELOR) 50 MG capsule TAKE 2 CAPSULES BY MOUTH AT BEDTIME. 12/19/16  Yes Fayrene Helper, MD  potassium chloride (K-DUR) 10 MEQ tablet TAKE ONE TABLET BY MOUTH TWICE DAILY. 09/25/16  Yes Fayrene Helper, MD  triamterene-hydrochlorothiazide (MAXZIDE-25) 37.5-25 MG tablet TAKE ONE TABLET BY MOUTH ONCE DAILY. 09/25/16  Yes Fayrene Helper, MD  sulfamethoxazole-trimethoprim (BACTRIM DS,SEPTRA DS) 800-160 MG tablet Take 1 tablet by mouth 2 (two) times daily. 02/15/17 02/22/17  Milton Ferguson, MD    Family History Family History  Problem Relation Age of Onset  . Hypertension Mother   . Heart disease Mother        CAD  . Hypertension Father         Severe DJD/ macrobacterium  . Hypertension Sister   . Hypertension Sister   . Fibromyalgia Sister   . Diabetes Brother   .  Colon cancer Neg Hx     Social History Social History  Substance Use Topics  . Smoking status: Never Smoker  . Smokeless tobacco: Never Used  . Alcohol use No     Allergies   Ace inhibitors; Ciprofloxacin; and Penicillins   Review of Systems Review of Systems  Constitutional: Negative for appetite change and fatigue.  HENT: Negative for congestion, ear discharge and sinus pressure.   Eyes: Negative for discharge.  Respiratory: Negative for cough.   Cardiovascular: Negative for chest pain.  Gastrointestinal: Negative for abdominal pain and diarrhea.  Genitourinary: Positive for frequency. Negative for  hematuria.  Musculoskeletal: Negative for back pain.  Skin: Negative for rash.  Neurological: Negative for seizures and headaches.  Psychiatric/Behavioral: Negative for hallucinations.     Physical Exam Updated Vital Signs BP (!) 145/93 (BP Location: Right Arm)   Pulse 86   Temp 98.2 F (36.8 C) (Oral)   Resp 16   Ht 5\' 1"  (1.549 m)   Wt 74.8 kg (165 lb)   SpO2 99%   BMI 31.18 kg/m   Physical Exam  Constitutional: She is oriented to person, place, and time. She appears well-developed.  HENT:  Head: Normocephalic.  Eyes: Conjunctivae and EOM are normal. No scleral icterus.  Neck: Neck supple. No thyromegaly present.  Cardiovascular: Normal rate and regular rhythm.  Exam reveals no gallop and no friction rub.   No murmur heard. Pulmonary/Chest: No stridor. She has no wheezes. She has no rales. She exhibits no tenderness.  Abdominal: She exhibits no distension. There is no tenderness. There is no rebound.  Genitourinary:  Genitourinary Comments: Tender right flank  Musculoskeletal: Normal range of motion. She exhibits no edema.  Lymphadenopathy:    She has no cervical adenopathy.  Neurological: She is oriented to person, place, and time. She exhibits normal muscle tone. Coordination normal.  Skin: No rash noted. No erythema.  Psychiatric: She has a normal mood and affect. Her behavior is normal.     ED Treatments / Results  Labs (all labs ordered are listed, but only abnormal results are displayed) Labs Reviewed  URINALYSIS, ROUTINE W REFLEX MICROSCOPIC - Abnormal; Notable for the following:       Result Value   Color, Urine AMBER (*)    APPearance CLOUDY (*)    Protein, ur 30 (*)    Nitrite POSITIVE (*)    Leukocytes, UA LARGE (*)    Bacteria, UA RARE (*)    Non Squamous Epithelial 0-5 (*)    All other components within normal limits  URINE CULTURE    EKG  EKG Interpretation None       Radiology No results found.  Procedures Procedures (including  critical care time)  Medications Ordered in ED Medications  sulfamethoxazole-trimethoprim (BACTRIM DS,SEPTRA DS) 800-160 MG per tablet 1 tablet (not administered)     Initial Impression / Assessment and Plan / ED Course  I have reviewed the triage vital signs and the nursing notes.  Pertinent labs & imaging results that were available during my care of the patient were reviewed by me and considered in my medical decision making (see chart for details).     Patient with urinary tract infection. Patient will be treated with Bactrim will follow-up with PCP  Final Clinical Impressions(s) / ED Diagnoses   Final diagnoses:  Acute cystitis without hematuria    New Prescriptions New Prescriptions   SULFAMETHOXAZOLE-TRIMETHOPRIM (BACTRIM DS,SEPTRA DS) 800-160 MG TABLET    Take 1 tablet by mouth 2 (  two) times daily.     Milton Ferguson, MD 02/15/17 1550

## 2017-02-15 NOTE — Discharge Instructions (Signed)
Drink plenty of fluids. Take Tylenol or Motrin for the pain. Follow-up with your family doctor after he finished the medicine

## 2017-02-18 LAB — URINE CULTURE: Culture: >=100000 — AB

## 2017-02-19 NOTE — Telephone Encounter (Signed)
Post ED Visit - Positive Culture Follow-up  Culture report reviewed by antimicrobial stewardship pharmacist:  []  Elenor Quinones, Pharm.D. []  Heide Guile, Pharm.D., BCPS AQ-ID []  Parks Neptune, Pharm.D., BCPS []  Alycia Rossetti, Pharm.D., BCPS []  [place], Pharm.D., BCPS, AAHIVP []  Legrand Como, Pharm.D., BCPS, AAHIVP []  Salome Arnt, PharmD, BCPS []  Dimitri Ped, PharmD, BCPS []  Vincenza Hews, PharmD, BCPS  Positive urine culture Treated with bactrim DS, organism sensitive to the same and no further patient follow-up is required at this time.  Hazle Nordmann 02/19/2017, 4:10 PM

## 2017-02-25 ENCOUNTER — Telehealth: Payer: Self-pay | Admitting: Family Medicine

## 2017-02-25 NOTE — Telephone Encounter (Signed)
Called and spoke to pt, she was seen in er [phone]  for cystitis , is asking for a sooner f/u with md. Needs to be Monday/tuesday/ or Friday am d/t transportation.

## 2017-02-25 NOTE — Telephone Encounter (Signed)
Pt voiced had uti and states she needs sooner appt than scheduled and what was offered which is 03/26/2017.  Please f/u

## 2017-02-26 NOTE — Telephone Encounter (Signed)
Patient made appt for July 24th.  Doing ok now with meds and will call our office if symptoms worsen or  she feels that she needs a sooner appt.

## 2017-02-28 NOTE — Telephone Encounter (Signed)
Noted  

## 2017-03-12 ENCOUNTER — Ambulatory Visit: Payer: Self-pay | Admitting: Family Medicine

## 2017-03-20 ENCOUNTER — Ambulatory Visit (INDEPENDENT_AMBULATORY_CARE_PROVIDER_SITE_OTHER): Payer: Medicare Other | Admitting: Family Medicine

## 2017-03-20 ENCOUNTER — Encounter: Payer: Self-pay | Admitting: Family Medicine

## 2017-03-20 ENCOUNTER — Other Ambulatory Visit: Payer: Self-pay | Admitting: Family Medicine

## 2017-03-20 VITALS — BP 122/82 | HR 82 | Resp 16 | Ht 61.0 in | Wt 162.1 lb

## 2017-03-20 DIAGNOSIS — F29 Unspecified psychosis not due to a substance or known physiological condition: Secondary | ICD-10-CM

## 2017-03-20 DIAGNOSIS — L299 Pruritus, unspecified: Secondary | ICD-10-CM | POA: Diagnosis not present

## 2017-03-20 DIAGNOSIS — J302 Other seasonal allergic rhinitis: Secondary | ICD-10-CM

## 2017-03-20 DIAGNOSIS — L309 Dermatitis, unspecified: Secondary | ICD-10-CM

## 2017-03-20 DIAGNOSIS — N3000 Acute cystitis without hematuria: Secondary | ICD-10-CM | POA: Diagnosis not present

## 2017-03-20 DIAGNOSIS — I10 Essential (primary) hypertension: Secondary | ICD-10-CM

## 2017-03-20 LAB — POCT URINALYSIS DIPSTICK
BILIRUBIN UA: NEGATIVE
GLUCOSE UA: NEGATIVE
KETONES UA: NEGATIVE
Nitrite, UA: NEGATIVE
Protein, UA: NEGATIVE
RBC UA: NEGATIVE
SPEC GRAV UA: 1.02 (ref 1.010–1.025)
Urobilinogen, UA: 0.2 E.U./dL
pH, UA: 7.5 (ref 5.0–8.0)

## 2017-03-20 NOTE — Patient Instructions (Signed)
Wellness visit December 2 or after, call if you need me sooner  MD follow up in 6 months  Urine checked today  It is important that you exercise regularly at least 30 minutes 5 times a week. If you develop chest pain, have severe difficulty breathing, or feel very tired, stop exercising immediately and seek medical attention  Please work on good  health habits so that your health will improve. 1. Commitment to daily physical activity for 30 to 60  minutes, if you are able to do this.  2. Commitment to wise food choices. Aim for half of your  food intake to be vegetable and fruit, one quarter starchy foods, and one quarter protein. Try to eat on a regular schedule  3 meals per day, snacking between meals should be limited to vegetables or fruits or small portions of nuts. 64 ounces of water per day is generally recommended, unless you have specific health conditions, like heart failure or kidney failure where you will need to limit fluid intake.  3. Commitment to sufficient and a  good quality of physical and mental rest daily, generally between 6 to 8 hours per day.  WITH PERSISTANCE AND PERSEVERANCE, THE IMPOSSIBLE , BECOMES THE NORM!

## 2017-03-21 ENCOUNTER — Encounter: Payer: Self-pay | Admitting: Family Medicine

## 2017-03-21 NOTE — Assessment & Plan Note (Signed)
Controlled, no change in medication  

## 2017-03-21 NOTE — Assessment & Plan Note (Signed)
Controlled, no change in medication DASH diet and commitment to daily physical activity for a minimum of 30 minutes discussed and encouraged, as a part of hypertension management. The importance of attaining a healthy weight is also discussed.  BP/Weight 03/20/2017 02/15/2017 10/03/2016 07/20/2016 04/02/2016 11/02/2015 14/05/3012  Systolic BP 143 888 757 972 820 601 561  Diastolic BP 82 82 80 82 82 70 82  Wt. (Lbs) 162.12 165 164 162.04 159 162.08 157  BMI 30.63 31.18 32.03 31.65 31.05 31.65 30.66

## 2017-03-21 NOTE — Assessment & Plan Note (Signed)
Treated in ED for UTI , currently mildly symptomatic with abn UA, will await c/s report

## 2017-03-21 NOTE — Assessment & Plan Note (Signed)
No current flare 

## 2017-03-21 NOTE — Progress Notes (Signed)
   Julia Stevens     MRN: [id number]      DOB: 08-30-1945   HPI Julia Stevens is here for follow up and re-evaluation of chronic medical conditions, medication management and review of any available recent lab and radiology data.  Preventive health is updated, specifically  Cancer screening and Immunization.   Treated in the D for UTI approx 1 month ago and still mildly symptomatic, denies fever or chills or flank pain, but frequency and dysuria   ROS Denies recent fever or chills. Denies sinus pressure, nasal congestion, ear pain or sore throat. Denies chest congestion, productive cough or wheezing. Denies chest pains, palpitations and leg swelling Denies abdominal pain, nausea, vomiting,diarrhea or constipation.   Denies dysuria, frequency, hesitancy or incontinence. Denies uncontrolled  joint pain, swelling and limitation in mobility. Denies headaches, seizures, numbness, or tingling. Denies depression, anxiety or in, has chronic dry skin.   PE  BP 122/82   Pulse 82   Resp 16   Ht 5\' 1"  (1.549 m)   Wt 162 lb 1.9 oz (73.5 kg)   SpO2 96%   BMI 30.63 kg/m   Patient alert and oriented and in no cardiopulmonary distress.  HEENT: No facial asymmetry, EOMI,   oropharynx pink and moist.  Neck supple no JVD, no mass.  Chest: Clear to auscultation bilaterally.  CVS: S1, S2 no murmurs, no S3.Regular rate.  ABD: Soft non tender. No renal angle or suprapubic tenderness Ext: No edema  MS: Adequate ROM spine, shoulders, hips and knees.  Skin: Intact, no ulcerations or rash noted.  Psych: Good eye contact, normal affect. Memory intact not anxious or depressed appearing.  CNS: CN 2-12 intact, power,  normal throughout.no focal deficits noted.   Assessment & Plan  HTN, goal below 140/80 Controlled, no change in medication DASH diet and commitment to daily physical activity for a minimum of 30 minutes discussed and encouraged, as a part of hypertension management. The  importance of attaining a healthy weight is also discussed.  BP/Weight 03/20/2017 02/15/2017 10/03/2016 07/20/2016 04/02/2016 11/02/2015 83/41/9622  Systolic BP 297 989 211 941 740 814 481  Diastolic BP 82 82 80 82 82 70 82  Wt. (Lbs) 162.12 165 164 162.04 159 162.08 157  BMI 30.63 31.18 32.03 31.65 31.05 31.65 30.66       Nonorganic psychosis Controlled, no change in medication   Pruritus Controlled, no change in medication   Allergic rhinitis Controlled, no change in medication   Eczema of both hands No current flare  Acute cystitis without hematuria Treated in ED for UTI , currently mildly symptomatic with abn UA, will await c/s report

## 2017-03-22 LAB — URINE CULTURE

## 2017-04-24 ENCOUNTER — Other Ambulatory Visit: Payer: Self-pay | Admitting: Family Medicine

## 2017-04-24 DIAGNOSIS — L299 Pruritus, unspecified: Secondary | ICD-10-CM

## 2017-04-24 NOTE — Telephone Encounter (Signed)
Seen 8 1 18 

## 2017-05-06 ENCOUNTER — Ambulatory Visit: Payer: Self-pay | Admitting: Family Medicine

## 2017-05-07 ENCOUNTER — Other Ambulatory Visit: Payer: Self-pay | Admitting: Family Medicine

## 2017-05-07 ENCOUNTER — Telehealth: Payer: Self-pay

## 2017-05-07 ENCOUNTER — Telehealth: Payer: Self-pay | Admitting: Family Medicine

## 2017-05-07 ENCOUNTER — Ambulatory Visit (INDEPENDENT_AMBULATORY_CARE_PROVIDER_SITE_OTHER): Payer: Medicare Other

## 2017-05-07 DIAGNOSIS — M549 Dorsalgia, unspecified: Secondary | ICD-10-CM

## 2017-05-07 DIAGNOSIS — R300 Dysuria: Secondary | ICD-10-CM

## 2017-05-07 LAB — POCT URINALYSIS DIPSTICK
Bilirubin, UA: NEGATIVE
Glucose, UA: NEGATIVE
Ketones, UA: NEGATIVE
NITRITE UA: POSITIVE
PH UA: 7 (ref 5.0–8.0)
PROTEIN UA: NEGATIVE
Spec Grav, UA: 1.025 (ref 1.010–1.025)
UROBILINOGEN UA: 0.2 U/dL

## 2017-05-07 MED ORDER — SULFAMETHOXAZOLE-TRIMETHOPRIM 800-160 MG PO TABS: 1.0000 | ORAL_TABLET | Freq: Two times a day (BID) | ORAL | 0 refills | Status: DC

## 2017-05-07 NOTE — Telephone Encounter (Signed)
Patient called requesting appointment for UTI symptoms.  Please schedule.

## 2017-05-07 NOTE — Addendum Note (Signed)
Addended by: Merceda Elks on: 05/07/2017 03:16 PM   Modules accepted: Orders

## 2017-05-07 NOTE — Telephone Encounter (Signed)
Tried to call patient three times to inform that med sent to pharm for UTI.  Busy all three times.

## 2017-05-08 ENCOUNTER — Telehealth: Payer: Self-pay | Admitting: Family Medicine

## 2017-05-08 NOTE — Telephone Encounter (Signed)
Spoke with [facility], they have received rx and will need to call patient before it is delivered. They will attempt to call patient today.

## 2017-05-08 NOTE — Telephone Encounter (Signed)
-----   Message from Fayrene Helper, MD sent at 05/07/2017  4:54 PM EDT ----- Septra #6 ptrescribed , I tried to notify pt , unable to contact her no answer, sent to [facility], pls f/u c/s

## 2017-05-09 ENCOUNTER — Telehealth: Payer: Self-pay | Admitting: Family Medicine

## 2017-05-09 NOTE — Telephone Encounter (Signed)
Patient can only schedule 3 days out because she relies on transportation that requires 3 day notice.  She states that she has had UTI issues before and the Dr. Moshe Cipro has called in something that cleared it up.  She says its difficult to make an appt to come in for just a urine sample, she really wants to see the doctor.  She is advised if she cannot wait until Dr.  Griffin Dakin return she can try urgent care or ER

## 2017-05-10 ENCOUNTER — Telehealth: Payer: Self-pay | Admitting: Family Medicine

## 2017-05-10 LAB — URINE CULTURE
MICRO NUMBER: 81030596
SPECIMEN QUALITY:: ADEQUATE

## 2017-05-10 NOTE — Telephone Encounter (Signed)
-----   Message from Fayrene Helper, MD sent at 05/10/2017  8:45 AM EDT ----- pls follow up and ensure that she DID collect / have the septra delivered for her bladder infection thank you

## 2017-05-10 NOTE — Telephone Encounter (Signed)
Was unable to reach patient, but pharmacy confirms that patient picked up medication.

## 2017-05-10 NOTE — Telephone Encounter (Signed)
Please review chart.  A prescription was sent to her pharmacy this week .

## 2017-05-10 NOTE — Telephone Encounter (Signed)
Noted. Will send to covering provider for review.

## 2017-05-10 NOTE — Telephone Encounter (Signed)
Was unable to reach patient, but pharmacy confirmed that patient did pick up medication.

## 2017-05-24 ENCOUNTER — Telehealth: Payer: Self-pay | Admitting: Family Medicine

## 2017-05-24 NOTE — Telephone Encounter (Signed)
Patient called to verify appointment

## 2017-05-27 ENCOUNTER — Other Ambulatory Visit: Payer: Self-pay | Admitting: Family Medicine

## 2017-06-13 ENCOUNTER — Other Ambulatory Visit: Payer: Self-pay | Admitting: Family Medicine

## 2017-06-13 NOTE — Telephone Encounter (Signed)
Seen 8 1 18 

## 2017-06-17 ENCOUNTER — Ambulatory Visit: Payer: Self-pay | Admitting: Family Medicine

## 2017-06-18 ENCOUNTER — Ambulatory Visit: Payer: Self-pay | Admitting: Family Medicine

## 2017-06-27 ENCOUNTER — Other Ambulatory Visit: Payer: Self-pay | Admitting: Family Medicine

## 2017-07-01 ENCOUNTER — Ambulatory Visit: Payer: Self-pay | Admitting: Family Medicine

## 2017-07-15 ENCOUNTER — Ambulatory Visit: Payer: Self-pay | Admitting: Family Medicine

## 2017-07-22 ENCOUNTER — Other Ambulatory Visit: Payer: Self-pay | Admitting: Family Medicine

## 2017-07-22 ENCOUNTER — Ambulatory Visit (INDEPENDENT_AMBULATORY_CARE_PROVIDER_SITE_OTHER): Payer: Medicare Other

## 2017-07-22 VITALS — BP 132/80 | HR 96 | Temp 98.3°F | Resp 16 | Ht 61.0 in | Wt 163.0 lb

## 2017-07-22 DIAGNOSIS — Z23 Encounter for immunization: Secondary | ICD-10-CM

## 2017-07-22 DIAGNOSIS — Z0000 Encounter for general adult medical examination without abnormal findings: Secondary | ICD-10-CM | POA: Diagnosis not present

## 2017-07-22 NOTE — Progress Notes (Signed)
Subjective:   Julia Stevens is a 71 y.o. female who presents for Medicare Annual (Subsequent) preventive examination.  Review of Systems:   Cardiac Risk Factors include: advanced age (>51men, >69 women);sedentary lifestyle     Objective:     Vitals: BP 132/80 (BP Location: Left Arm, Patient Position: Sitting, Cuff Size: Normal)   Pulse 96   Temp 98.3 F (36.8 C) (Other (Comment))   Resp 16   Ht 5\' 1"  (1.549 m)   Wt 163 lb (73.9 kg)   SpO2 98%   BMI 30.80 kg/m   Body mass index is 30.8 kg/m.  Advanced Directives 07/22/2017 02/15/2017 12/25/2013  Does Patient Have a Medical Advance Directive? No No Patient does not have advance directive;Patient would not like information  Would patient like information on creating a medical advance directive? No - Patient declined No - Patient declined -  Pre-existing out of facility DNR order (yellow form or pink MOST form) - - No    Tobacco Social History   Tobacco Use  Smoking Status Never Smoker  Smokeless Tobacco Never Used     Counseling given: Not Answered   Clinical Intake:  Pre-visit preparation completed: Yes  Pain : No/denies pain Pain Score: 0-No pain     Diabetes: No  Activities of Daily Living: Independent Ambulation: Independent Medication Administration: Independent Home Management: Independent     Do you feel unsafe in your current relationship?: No Do you feel physically threatened by others?: No Anyone hurting you at home, work, or school?: No Unable to ask?: No  How often do you need to have someone help you when you read instructions, pamphlets, or other written materials from your doctor or pharmacy?: 1 - Never  Interpreter Needed?: No     Past Medical History:  Diagnosis Date  . Arthritis   . Dysphagia   . Eczema   . Hyperlipidemia   . Hypertension   . Psychosis ([place])   . Scleroderma ([place])   . Shingles    Past Surgical History:  Procedure Laterality Date  . COLONOSCOPY   06/26/2000   [name],   . COLONOSCOPY N/A 12/25/2013   Procedure: COLONOSCOPY;  Surgeon: Danie Binder, MD;  Location: [facility];  Service: Endoscopy;  Laterality: N/A;  10:45AM  . TOTAL ABDOMINAL HYSTERECTOMY W/ BILATERAL SALPINGOOPHORECTOMY  1994   Family History  Problem Relation Age of Onset  . Hypertension Mother   . Heart disease Mother        CAD  . Hypertension Father         Severe DJD/ macrobacterium  . Hypertension Sister   . Hypertension Sister   . Fibromyalgia Sister   . Diabetes Brother   . Colon cancer Neg Hx    Social History   Socioeconomic History  . Marital status: Single    Spouse name: None  . Number of children: 0  . Years of education: None  . Highest education level: None  Social Needs  . Financial resource strain: Not very hard  . Food insecurity - worry: Never true  . Food insecurity - inability: Never true  . Transportation needs - medical: Yes  . Transportation needs - non-medical: Yes  Occupational History  . Occupation: disabled     Fish farm manager: UNEMPLOYED  Tobacco Use  . Smoking status: Never Smoker  . Smokeless tobacco: Never Used  Substance and Sexual Activity  . Alcohol use: No  . Drug use: No  . Sexual activity: Not Currently  Birth control/protection: Surgical  Other Topics Concern  . None  Social History Narrative  . None    Outpatient Encounter Medications as of 07/22/2017  Medication Sig  . aspirin EC 81 MG tablet Take 81 mg by mouth daily.  . calcium carbonate (OS-CAL) 600 MG TABS tablet Take 600 mg by mouth 2 (two) times daily with a meal.  . cetirizine (ZYRTEC) 10 MG tablet TAKE ONE TABLET BY MOUTH DAILY.  Marland Kitchen diltiazem (CARDIZEM CD) 240 MG 24 hr capsule TAKE (1) CAPSULE BY MOUTH ONCE DAILY.  . hydrOXYzine (ATARAX/VISTARIL) 50 MG tablet TAKE (1) TABLET BY MOUTH AT BEDTIME.  . Multiple Vitamin (MULTIVITAMIN) capsule Take 1 capsule by mouth daily.    . nortriptyline (PAMELOR) 50 MG capsule TAKE 2 CAPSULES BY MOUTH AT  BEDTIME.  Marland Kitchen potassium chloride (K-DUR) 10 MEQ tablet TAKE ONE TABLET BY MOUTH TWICE DAILY.  . [DISCONTINUED] triamterene-hydrochlorothiazide (MAXZIDE-25) 37.5-25 MG tablet TAKE ONE TABLET BY MOUTH ONCE DAILY.  . [DISCONTINUED] nortriptyline (PAMELOR) 50 MG capsule TAKE 2 CAPSULES BY MOUTH AT BEDTIME.  . [DISCONTINUED] sulfamethoxazole-trimethoprim (BACTRIM DS,SEPTRA DS) 800-160 MG tablet Take 1 tablet by mouth 2 (two) times daily.   No facility-administered encounter medications on file as of 07/22/2017.     Activities of Daily Living In your present state of health, do you have any difficulty performing the following activities: 07/22/2017  Hearing? N  Vision? N  Difficulty concentrating or making decisions? Y  Walking or climbing stairs? Y  Dressing or bathing? N  Doing errands, shopping? Y  Preparing Food and eating ? N  Using the Toilet? N  In the past six months, have you accidently leaked urine? Y  Do you have problems with loss of bowel control? N  Managing your Medications? N  Managing your Finances? N  Housekeeping or managing your Housekeeping? N  Some recent data might be hidden      Patient Care Team: Fayrene Helper, MD as PCP - General Danie Binder, MD as Consulting Physician (Gastroenterology)    Assessment:     Exercise Activities and Dietary recommendations Current Exercise Habits: Home exercise routine, Type of exercise: stretching, Time (Minutes): 10, Frequency (Times/Week): 5, Weekly Exercise (Minutes/Week): 50, Intensity: Mild  Goals    None     Fall Risk Fall Risk  07/22/2017 03/20/2017 11/02/2015 02/02/2015 02/02/2015  Falls in the past year? Yes No No No No  Number falls in past yr: 1 - - - -  Injury with Fall? No - - - -  Follow up Falls prevention discussed - - - -   Is the patient's home free of loose throw rugs in walkways, pet beds, electrical cords, etc?   yes      Grab bars in the bathroom? yes      Handrails on the stairs?   yes       Adequate lighting?   yes  Depression Screen PHQ 2/9 Scores 07/22/2017 03/20/2017 11/02/2015 09/01/2014  PHQ - 2 Score 0 0 0 2  PHQ- 9 Score - - - 6     Cognitive Function     6CIT Screen 07/22/2017  What Year? 0 points  What month? 0 points  What time? 0 points  Count back from 20 0 points  Months in reverse 4 points  Repeat phrase 2 points  Total Score 6    Immunization History  Administered Date(s) Administered  . 19-influenza Whole 07/23/2011  . Influenza Split 07/28/2012, 06/22/2014  . Influenza Whole 07/04/2007,  06/29/2009, 07/10/2010  . Influenza,inj,Quad PF,6+ Mos 05/20/2013, 07/05/2015, 07/20/2016, 07/22/2017  . Pneumococcal Conjugate-13 03/01/2014  . Pneumococcal Polysaccharide-23 08/21/2013  . Td 01/04/2004  . Tdap 07/23/2011  . Zoster 09/22/2013   Screening Tests Health Maintenance  Topic Date Due  . MAMMOGRAM  09/14/2018  . TETANUS/TDAP  07/22/2021  . COLONOSCOPY  12/26/2023  . INFLUENZA VACCINE  Completed  . DEXA SCAN  Completed  . Hepatitis C Screening  Completed  . PNA vac Low Risk Adult  Completed   Cancer Screenings: Lung:  Low Dose CT Chest recommended if Age 15-80 years, 30 pack-year currently smoking OR have quit w/in 15years. Patient does not qualify. Breast:  Up to date on Mammogram? Yes  Up to date of Bone Density/Dexa? Yes Colorectal: Due 2025  Additional Screenings:  Hepatitis B/HIV/Syphillis: Discuss with PCP Hepatitis C Screening: Discuss with PCP     Plan:      I have personally reviewed and noted the following in the patient's chart:   . Medical and social history . Use of alcohol, tobacco or illicit drugs  . Current medications and supplements . Functional ability and status . Nutritional status . Physical activity . Advanced directives . List of other physicians . Hospitalizations, surgeries, and ER visits in previous 12 months . Vitals . Screenings to include cognitive, depression, and falls . Referrals and  appointments  In addition, I have reviewed and discussed with patient certain preventive protocols, quality metrics, and best practice recommendations. A written personalized care plan for preventive services as well as general preventive health recommendations were provided to patient.     Merceda Elks, LPN  [id number]

## 2017-07-22 NOTE — Patient Instructions (Addendum)
Julia Stevens , Thank you for taking time to come for your Medicare Wellness Visit. I appreciate your ongoing commitment to your health goals. Please review the following plan we discussed and let me know if I can assist you in the future.   Screening recommendations/referrals: Colonoscopy: 2025 Mammogram: 2019 Bone Density: Done Recommended yearly ophthalmology/optometry visit for glaucoma screening and checkup Recommended yearly dental visit for hygiene and checkup  Vaccinations: Influenza vaccine: Today Pneumococcal vaccine: 2020 Tdap vaccine: 2022 Shingles vaccine: Discuss updating to Shingrix with PCP    Advanced directives: Discussed in office  Conditions/risks identified: Fall  Next appointment: 07/31/2017   Preventive Care 65 Years and Older, Female Preventive care refers to lifestyle choices and visits with your health care provider that can promote health and wellness. What does preventive care include?  A yearly physical exam. This is also called an annual well check.  Dental exams once or twice a year.  Routine eye exams. Ask your health care provider how often you should have your eyes checked.  Personal lifestyle choices, including:  Daily care of your teeth and gums.  Regular physical activity.  Eating a healthy diet.  Avoiding tobacco and drug use.  Limiting alcohol use.  Practicing safe sex.  Taking low-dose aspirin every day.  Taking vitamin and mineral supplements as recommended by your health care provider. What happens during an annual well check? The services and screenings done by your health care provider during your annual well check will depend on your age, overall health, lifestyle risk factors, and family history of disease. Counseling  Your health care provider may ask you questions about your:  Alcohol use.  Tobacco use.  Drug use.  Emotional well-being.  Home and relationship well-being.  Sexual activity.  Eating  habits.  History of falls.  Memory and ability to understand (cognition).  Work and work Statistician.  Reproductive health. Screening  You may have the following tests or measurements:  Height, weight, and BMI.  Blood pressure.  Lipid and cholesterol levels. These may be checked every 5 years, or more frequently if you are over 90 years old.  Skin check.  Lung cancer screening. You may have this screening every year starting at age 51 if you have a 30-pack-year history of smoking and currently smoke or have quit within the past 15 years.  Fecal occult blood test (FOBT) of the stool. You may have this test every year starting at age 4.  Flexible sigmoidoscopy or colonoscopy. You may have a sigmoidoscopy every 5 years or a colonoscopy every 10 years starting at age 73.  Hepatitis C blood test.  Hepatitis B blood test.  Sexually transmitted disease (STD) testing.  Diabetes screening. This is done by checking your blood sugar (glucose) after you have not eaten for a while (fasting). You may have this done every 1-3 years.  Bone density scan. This is done to screen for osteoporosis. You may have this done starting at age 19.  Mammogram. This may be done every 1-2 years. Talk to your health care provider about how often you should have regular mammograms. Talk with your health care provider about your test results, treatment options, and if necessary, the need for more tests. Vaccines  Your health care provider may recommend certain vaccines, such as:  Influenza vaccine. This is recommended every year.  Tetanus, diphtheria, and acellular pertussis (Tdap, Td) vaccine. You may need a Td booster every 10 years.  Zoster vaccine. You may need this after age 42.  Pneumococcal 13-valent conjugate (PCV13) vaccine. One dose is recommended after age 21.  Pneumococcal polysaccharide (PPSV23) vaccine. One dose is recommended after age 71. Talk to your health care provider about which  screenings and vaccines you need and how often you need them. This information is not intended to replace advice given to you by your health care provider. Make sure you discuss any questions you have with your health care provider. Document Released: 09/02/2015 Document Revised: 04/25/2016 Document Reviewed: 06/07/2015 Elsevier Interactive Patient Education  2017 [place] Prevention in the Home Falls can cause injuries. They can happen to people of all ages. There are many things you can do to make your home safe and to help prevent falls. What can I do on the outside of my home?  Regularly fix the edges of walkways and driveways and fix any cracks.  Remove anything that might make you trip as you walk through a door, such as a raised step or threshold.  Trim any bushes or trees on the path to your home.  Use bright outdoor lighting.  Clear any walking paths of anything that might make someone trip, such as rocks or tools.  Regularly check to see if handrails are loose or broken. Make sure that both sides of any steps have handrails.  Any raised decks and porches should have guardrails on the edges.  Have any leaves, snow, or ice cleared regularly.  Use sand or salt on walking paths during winter.  Clean up any spills in your garage right away. This includes oil or grease spills. What can I do in the bathroom?  Use night lights.  Install grab bars by the toilet and in the tub and shower. Do not use towel bars as grab bars.  Use non-skid mats or decals in the tub or shower.  If you need to sit down in the shower, use a plastic, non-slip stool.  Keep the floor dry. Clean up any water that spills on the floor as soon as it happens.  Remove soap buildup in the tub or shower regularly.  Attach bath mats securely with double-sided non-slip rug tape.  Do not have throw rugs and other things on the floor that can make you trip. What can I do in the bedroom?  Use  night lights.  Make sure that you have a light by your bed that is easy to reach.  Do not use any sheets or blankets that are too big for your bed. They should not hang down onto the floor.  Have a firm chair that has side arms. You can use this for support while you get dressed.  Do not have throw rugs and other things on the floor that can make you trip. What can I do in the kitchen?  Clean up any spills right away.  Avoid walking on wet floors.  Keep items that you use a lot in easy-to-reach places.  If you need to reach something above you, use a strong step stool that has a grab bar.  Keep electrical cords out of the way.  Do not use floor polish or wax that makes floors slippery. If you must use wax, use non-skid floor wax.  Do not have throw rugs and other things on the floor that can make you trip. What can I do with my stairs?  Do not leave any items on the stairs.  Make sure that there are handrails on both sides of the stairs and use them.  Fix handrails that are broken or loose. Make sure that handrails are as long as the stairways.  Check any carpeting to make sure that it is firmly attached to the stairs. Fix any carpet that is loose or worn.  Avoid having throw rugs at the top or bottom of the stairs. If you do have throw rugs, attach them to the floor with carpet tape.  Make sure that you have a light switch at the top of the stairs and the bottom of the stairs. If you do not have them, ask someone to add them for you. What else can I do to help prevent falls?  Wear shoes that:  Do not have high heels.  Have rubber bottoms.  Are comfortable and fit you well.  Are closed at the toe. Do not wear sandals.  If you use a stepladder:  Make sure that it is fully opened. Do not climb a closed stepladder.  Make sure that both sides of the stepladder are locked into place.  Ask someone to hold it for you, if possible.  Clearly mark and make sure that you  can see:  Any grab bars or handrails.  First and last steps.  Where the edge of each step is.  Use tools that help you move around (mobility aids) if they are needed. These include:  Canes.  Walkers.  Scooters.  Crutches.  Turn on the lights when you go into a dark area. Replace any light bulbs as soon as they burn out.  Set up your furniture so you have a clear path. Avoid moving your furniture around.  If any of your floors are uneven, fix them.  If there are any pets around you, be aware of where they are.  Review your medicines with your doctor. Some medicines can make you feel dizzy. This can increase your chance of falling. Ask your doctor what other things that you can do to help prevent falls. This information is not intended to replace advice given to you by your health care provider. Make sure you discuss any questions you have with your health care provider. Document Released: 06/02/2009 Document Revised: 01/12/2016 Document Reviewed: 09/10/2014 Elsevier Interactive Patient Education  2017 [place] for Adults  A healthy lifestyle and preventive care can promote health and wellness. Preventive health guidelines for adults include the following key practices.  . A routine yearly physical is a good way to check with your health care provider about your health and preventive screening. It is a chance to share any concerns and updates on your health and to receive a thorough exam.  . Visit your dentist for a routine exam and preventive care every 6 months. Brush your teeth twice a day and floss once a day. Good oral hygiene prevents tooth decay and gum disease.  . The frequency of eye exams is based on your age, health, family medical history, use  of contact lenses, and other factors. Follow your health care provider's ecommendations for frequency of eye exams.  . Eat a healthy diet. Foods like vegetables, fruits, whole grains, low-fat dairy  products, and lean protein foods contain the nutrients you need without too many calories. Decrease your intake of foods high in solid fats, added sugars, and salt. Eat the right amount of calories for you. Get information about a proper diet from your health care provider, if necessary.  . Regular physical exercise is one of the most important things you can do for your health.  Most adults should get at least 150 minutes of moderate-intensity exercise (any activity that increases your heart rate and causes you to sweat) each week. In addition, most adults need muscle-strengthening exercises on 2 or more days a week.  Silver Sneakers may be a benefit available to you. To determine eligibility, you may visit the website: www.silversneakers.com or contact program at 272-080-2348 Mon-Fri between 8AM-8PM.   . Maintain a healthy weight. The body mass index (BMI) is a screening tool to identify possible weight problems. It provides an estimate of body fat based on height and weight. Your health care provider can find your BMI and can help you achieve or maintain a healthy weight.   For adults 20 years and older: ? A BMI below 18.5 is considered underweight. ? A BMI of 18.5 to 24.9 is normal. ? A BMI of 25 to 29.9 is considered overweight. ? A BMI of 30 and above is considered obese.   . Maintain normal blood lipids and cholesterol levels by exercising and minimizing your intake of saturated fat. Eat a balanced diet with plenty of fruit and vegetables. Blood tests for lipids and cholesterol should begin at age 74 and be repeated every 5 years. If your lipid or cholesterol levels are high, you are over 50, or you are at high risk for heart disease, you may need your cholesterol levels checked more frequently. Ongoing high lipid and cholesterol levels should be treated with medicines if diet and exercise are not working.  . If you smoke, find out from your health care provider how to quit. If you do not  use tobacco, please do not start.  . If you choose to drink alcohol, please do not consume more than 2 drinks per day. One drink is considered to be 12 ounces (355 mL) of beer, 5 ounces (148 mL) of wine, or 1.5 ounces (44 mL) of liquor.  . If you are 20-57 years old, ask your health care provider if you should take aspirin to prevent strokes.  . Use sunscreen. Apply sunscreen liberally and repeatedly throughout the day. You should seek shade when your shadow is shorter than you. Protect yourself by wearing long sleeves, pants, a wide-brimmed hat, and sunglasses year round, whenever you are outdoors.  . Once a month, do a whole body skin exam, using a mirror to look at the skin on your back. Tell your health care provider of new moles, moles that have irregular borders, moles that are larger than a pencil eraser, or moles that have changed in shape or color.  Please bring a copy of your POA (Power of [place]) and/or Living Will to your next appointment.

## 2017-07-22 NOTE — Telephone Encounter (Signed)
Seen 8 1 18 

## 2017-07-31 ENCOUNTER — Telehealth: Payer: Self-pay | Admitting: Family Medicine

## 2017-07-31 ENCOUNTER — Ambulatory Visit: Payer: Self-pay | Admitting: Family Medicine

## 2017-07-31 NOTE — Telephone Encounter (Signed)
Pt called to cancel appt for today 12-12 @ 10:20 due to the weather,  She wants an appt before the end of the year. I didn't see anything available,  The pt request Mon, Wed, or Friday Mornings. Please let me know if there is a open spot to put the pt in ..[phone]

## 2017-07-31 NOTE — Telephone Encounter (Signed)
Called the patient and schd

## 2017-07-31 NOTE — Telephone Encounter (Signed)
1:40 on dec 17

## 2017-08-05 ENCOUNTER — Encounter: Payer: Self-pay | Admitting: Family Medicine

## 2017-08-05 ENCOUNTER — Ambulatory Visit (INDEPENDENT_AMBULATORY_CARE_PROVIDER_SITE_OTHER): Payer: Medicare Other | Admitting: Family Medicine

## 2017-08-05 VITALS — BP 140/90 | HR 86 | Resp 16 | Ht 61.0 in | Wt 166.0 lb

## 2017-08-05 DIAGNOSIS — Z1231 Encounter for screening mammogram for malignant neoplasm of breast: Secondary | ICD-10-CM | POA: Diagnosis not present

## 2017-08-05 DIAGNOSIS — E785 Hyperlipidemia, unspecified: Secondary | ICD-10-CM | POA: Diagnosis not present

## 2017-08-05 DIAGNOSIS — I10 Essential (primary) hypertension: Secondary | ICD-10-CM | POA: Diagnosis not present

## 2017-08-05 DIAGNOSIS — J302 Other seasonal allergic rhinitis: Secondary | ICD-10-CM

## 2017-08-05 DIAGNOSIS — F29 Unspecified psychosis not due to a substance or known physiological condition: Secondary | ICD-10-CM | POA: Diagnosis not present

## 2017-08-05 DIAGNOSIS — Z1239 Encounter for other screening for malignant neoplasm of breast: Secondary | ICD-10-CM

## 2017-08-05 DIAGNOSIS — L309 Dermatitis, unspecified: Secondary | ICD-10-CM

## 2017-08-05 MED ORDER — HYDROCHLOROTHIAZIDE 25 MG PO TABS: 25.0000 mg | ORAL_TABLET | Freq: Every day | ORAL | 5 refills | Status: DC

## 2017-08-05 MED ORDER — BENZONATATE 100 MG PO CAPS: 100.0000 mg | ORAL_CAPSULE | Freq: Two times a day (BID) | ORAL | 0 refills | Status: DC | PRN

## 2017-08-05 NOTE — Patient Instructions (Addendum)
F/u with rectal in 4. months, call if you need me sooner  Nurse bP check in 4 to 5 weeks    Please schedule mammogram at checkout , due Jan 29 or after  Blood pressure is high, new additional medication for this is hCTZ 25 mg one daily, start today  Labs today CBC, chem 7, lipid and tSH  Tessalon perles sent for chest congestion  It is important that you exercise regularly at least 30 minutes 5 times a week. If you develop chest pain, have severe difficulty breathing, or feel very tired, stop exercising immediately and seek medical attention

## 2017-08-05 NOTE — Assessment & Plan Note (Signed)
Uncontrolled Add HCTZ 25 mg one daily DASH diet and commitment to daily physical activity for a minimum of 30 minutes discussed and encouraged, as a part of hypertension management. The importance of attaining a healthy weight is also discussed.  BP/Weight 08/05/2017 07/22/2017 03/20/2017 02/15/2017 10/03/2016 07/20/2016 4/73/4037  Systolic BP 096 438 381 840 375 436 067  Diastolic BP 90 80 82 82 80 82 82  Wt. (Lbs) 166 163 162.12 165 164 162.04 159  BMI 31.37 30.8 30.63 31.18 32.03 31.65 31.05   Nurse bP check in 5 to 5 weeks

## 2017-08-05 NOTE — Progress Notes (Signed)
   Julia Stevens     MRN: [id number]      DOB: 03/18/1946   HPI Julia Stevens is here for follow up and re-evaluation of chronic medical conditions, medication management and review of any available recent lab and radiology data.  Preventive health is updated, specifically  Cancer screening and Immunization.   Questions or concerns regarding consultations or procedures which the PT has had in the interim are  addressed. The PT denies any adverse reactions to current medications since the last visit.  5 week h/o cough and chest congestion, no chills, or fever, no sputum Nostrils feel damp and wet, drainage from nostrils  Is clear ROS Denies chest pains, palpitations and leg swelling Denies abdominal pain, nausea, vomiting,diarrhea or constipation.   Denies dysuria, frequency, hesitancy or incontinence. c/o joint pain, swelling and limitation in mobility. Denies headaches, seizures, numbness, or tingling. Denies depression, anxiety or insomnia. Denies skin break down or rash.   PE  BP 140/90   Pulse 86   Resp 16   Ht 5\' 1"  (1.549 m)   Wt 166 lb (75.3 kg)   SpO2 95%   BMI 31.37 kg/m   Patient alert and oriented and in no cardiopulmonary distress.  HEENT: No facial asymmetry, EOMI,   oropharynx pink and moist.  Neck supple no JVD, no mass.  Chest: Clear to auscultation bilaterally.  CVS: S1, S2 no murmurs, no S3.Regular rate.  ABD: Soft non tender.   Ext: one plus edema  MS: Adequate ROM spine, shoulders, hips and knees.  Skin: Intact, no ulcerations or rash noted.  Psych: Good eye contact, normal affect. Memory intact not anxious or depressed appearing.  CNS: CN 2-12 intact, power,  normal throughout.no focal deficits noted.   Assessment & Plan  HTN, goal below 140/80 Uncontrolled Add HCTZ 25 mg one daily DASH diet and commitment to daily physical activity for a minimum of 30 minutes discussed and encouraged, as a part of hypertension management. The importance  of attaining a healthy weight is also discussed.  BP/Weight 08/05/2017 07/22/2017 03/20/2017 02/15/2017 10/03/2016 07/20/2016 04/17/5620  Systolic BP 308 657 846 962 952 841 324  Diastolic BP 90 80 82 82 80 82 82  Wt. (Lbs) 166 163 162.12 165 164 162.04 159  BMI 31.37 30.8 30.63 31.18 32.03 31.65 31.05   Nurse bP check in 5 to 5 weeks    Hyperlipidemia LDL goal <100 Uncontrolled, needs to start medication Hyperlipidemia:Low fat diet discussed and encouraged.   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

## 2017-08-06 LAB — BASIC METABOLIC PANEL
BUN/Creatinine Ratio: 17 (calc) (ref 6–22)
BUN: 18 mg/dL (ref 7–25)
CALCIUM: 9.7 mg/dL (ref 8.6–10.4)
CHLORIDE: 104 mmol/L (ref 98–110)
CO2: 30 mmol/L (ref 20–32)
Creat: 1.06 mg/dL — ABNORMAL HIGH (ref 0.50–0.99)
Glucose, Bld: 90 mg/dL (ref 65–99)
POTASSIUM: 4 mmol/L (ref 3.5–5.3)
SODIUM: 141 mmol/L (ref 135–146)

## 2017-08-06 LAB — CBC
HCT: 37.1 % (ref 35.0–45.0)
Hemoglobin: 12.3 g/dL (ref 11.7–15.5)
MCH: 29.5 pg (ref 27.0–33.0)
MCHC: 33.2 g/dL (ref 32.0–36.0)
MCV: 89 fL (ref 80.0–100.0)
MPV: 10.3 fL (ref 7.5–12.5)
PLATELETS: 233 10*3/uL (ref 140–400)
RBC: 4.17 10*6/uL (ref 3.80–5.10)
RDW: 12.7 % (ref 11.0–15.0)
WBC: 4.2 10*3/uL (ref 3.8–10.8)

## 2017-08-06 LAB — LIPID PANEL
CHOLESTEROL: 230 mg/dL — AB (ref <200)
HDL: 54 mg/dL (ref >50)
LDL CHOLESTEROL (CALC): 147 mg/dL — AB
Non-HDL Cholesterol (Calc): 176 mg/dL (calc) — ABNORMAL HIGH (ref <130)
Total CHOL/HDL Ratio: 4.3 (calc) (ref <5.0)
Triglycerides: 154 mg/dL — ABNORMAL HIGH (ref <150)

## 2017-08-06 LAB — TSH: TSH: 2.3 mIU/L (ref 0.40–4.50)

## 2017-08-11 ENCOUNTER — Encounter: Payer: Self-pay | Admitting: Family Medicine

## 2017-08-11 NOTE — Assessment & Plan Note (Signed)
Uncontrolled, needs to start medication Hyperlipidemia:Low fat diet discussed and encouraged.   Lipid Panel  Lab Results  Component Value Date   CHOL 230 (H) 08/05/2017   HDL 54 08/05/2017   LDLCALC 144 (H) 10/03/2016   TRIG 154 (H) 08/05/2017   CHOLHDL 4.3 08/05/2017   Will update pt after visit  '

## 2017-08-11 NOTE — Assessment & Plan Note (Signed)
Controlled, no change in medication  

## 2017-08-11 NOTE — Assessment & Plan Note (Signed)
Stable on current medication, no change

## 2017-08-30 ENCOUNTER — Telehealth: Payer: Self-pay | Admitting: Family Medicine

## 2017-08-30 NOTE — Telephone Encounter (Signed)
Patient called in to ask if she is suppose to take HcTz and hydrochlorothiazide together, after speaking with Velna Hatchet (she reviewed patients chart) I let her know it was ok to take both pills in the am.

## 2017-09-02 ENCOUNTER — Other Ambulatory Visit: Payer: Self-pay | Admitting: Family Medicine

## 2017-09-04 ENCOUNTER — Other Ambulatory Visit: Payer: Self-pay

## 2017-09-04 ENCOUNTER — Ambulatory Visit: Payer: Medicare Other

## 2017-09-04 ENCOUNTER — Telehealth: Payer: Self-pay

## 2017-09-04 ENCOUNTER — Other Ambulatory Visit: Payer: Self-pay | Admitting: Family Medicine

## 2017-09-04 VITALS — BP 130/90

## 2017-09-04 DIAGNOSIS — I10 Essential (primary) hypertension: Secondary | ICD-10-CM

## 2017-09-04 MED ORDER — DILTIAZEM HCL ER COATED BEADS 300 MG PO TB24: 300.0000 mg | ORAL_TABLET | Freq: Every day | ORAL | 4 refills | Status: DC

## 2017-09-04 NOTE — Progress Notes (Signed)
Change in therapy. 

## 2017-09-04 NOTE — Telephone Encounter (Signed)
Patient aware and new dose sent

## 2017-09-04 NOTE — Telephone Encounter (Signed)
Julia Stevens was started on HCTZ in addition to her other BP med at her last visit. She was given  maxzide HCT and HCTZ but she has been scared to take both. Her BP today was 138/90. Please advise

## 2017-09-04 NOTE — Telephone Encounter (Signed)
I increased her dose of cardizem, please verify with the pharmacy that she can get this and d/c the hCTZ, then explain to the pt that she needs a higher dose of cardizem to control her bP. I only entered the cardizem historically

## 2017-09-04 NOTE — Progress Notes (Signed)
Patient came in today for a blood pressure check. She tells me that Dr. Moshe Cipro started her on a new water pill at her last visit. She states she is still taking the Maxzide, as well as the hydrochlorothiazide.

## 2017-09-05 ENCOUNTER — Telehealth: Payer: Self-pay | Admitting: Family Medicine

## 2017-09-05 NOTE — Telephone Encounter (Signed)
Patient aware.

## 2017-09-05 NOTE — Telephone Encounter (Signed)
Patient is calling to confirm which water pill Dr.Simpson wanted her to take. [phone]

## 2017-09-20 ENCOUNTER — Other Ambulatory Visit: Payer: Self-pay | Admitting: Family Medicine

## 2017-09-20 ENCOUNTER — Telehealth: Payer: Self-pay | Admitting: Family Medicine

## 2017-09-20 NOTE — Telephone Encounter (Signed)
Pls call and let her know next fill will be same medication but in a different form due to formulary coverage had to change to diltiazem ER from Cardizem CD , it is entred historically, pls send after you speak with her, may also need to speak with pharmacy re the change also so this does not get "bundled"  Needs nurse BP check end March please also

## 2017-09-25 ENCOUNTER — Other Ambulatory Visit: Payer: Self-pay

## 2017-09-25 MED ORDER — DILTIAZEM HCL ER BEADS 300 MG PO CP24: 300.0000 mg | ORAL_CAPSULE | Freq: Every day | ORAL | 3 refills | Status: DC

## 2017-09-25 NOTE — Telephone Encounter (Signed)
Made CA aware that at next fill to dispense this one and d/c the old one due to formulary

## 2017-10-21 ENCOUNTER — Other Ambulatory Visit: Payer: Self-pay | Admitting: Family Medicine

## 2017-12-04 ENCOUNTER — Ambulatory Visit: Payer: Self-pay | Admitting: Family Medicine

## 2017-12-18 ENCOUNTER — Ambulatory Visit: Payer: Self-pay | Admitting: Family Medicine

## 2018-01-16 ENCOUNTER — Ambulatory Visit (INDEPENDENT_AMBULATORY_CARE_PROVIDER_SITE_OTHER): Payer: Medicare Other | Admitting: Family Medicine

## 2018-01-16 ENCOUNTER — Encounter: Payer: Self-pay | Admitting: Family Medicine

## 2018-01-16 VITALS — BP 120/80 | HR 73 | Resp 16 | Ht 61.0 in | Wt 161.0 lb

## 2018-01-16 DIAGNOSIS — E669 Obesity, unspecified: Secondary | ICD-10-CM | POA: Diagnosis not present

## 2018-01-16 DIAGNOSIS — L309 Dermatitis, unspecified: Secondary | ICD-10-CM | POA: Diagnosis not present

## 2018-01-16 DIAGNOSIS — E66811 Obesity, class 1: Secondary | ICD-10-CM

## 2018-01-16 DIAGNOSIS — E785 Hyperlipidemia, unspecified: Secondary | ICD-10-CM | POA: Diagnosis not present

## 2018-01-16 DIAGNOSIS — I10 Essential (primary) hypertension: Secondary | ICD-10-CM

## 2018-01-16 DIAGNOSIS — Z1231 Encounter for screening mammogram for malignant neoplasm of breast: Secondary | ICD-10-CM | POA: Diagnosis not present

## 2018-01-16 DIAGNOSIS — M79675 Pain in left toe(s): Secondary | ICD-10-CM | POA: Diagnosis not present

## 2018-01-16 MED ORDER — POTASSIUM CHLORIDE ER 10 MEQ PO TBCR: 10.0000 meq | EXTENDED_RELEASE_TABLET | Freq: Two times a day (BID) | ORAL | 1 refills | Status: DC

## 2018-01-16 MED ORDER — NORTRIPTYLINE HCL 50 MG PO CAPS: 100.0000 mg | ORAL_CAPSULE | Freq: Every day | ORAL | 1 refills | Status: DC

## 2018-01-16 MED ORDER — HYDROXYZINE HCL 50 MG PO TABS: ORAL_TABLET | ORAL | 1 refills | Status: DC

## 2018-01-16 NOTE — Patient Instructions (Signed)
Wellness with Nurse December  4 or after   MD follow up in March, call if you need me before  Please schedule mammogram at checkoout , opast due  No labs today  You will get fasting labs in December  Blood pressure is excellent , stayb on same medicine  You are referred to Podiatrist  You do need to get stool test done so call` and lwet [place] know when you decide  Please cut back on fried and fatty foods sop t you cut back on risk of heart disease and stroke  It is important that you exercise regularly at least 30 minutes 5 times a week. If you develop chest pain, have severe difficulty breathing, or feel very tired, stop exercising immediately and seek medical attention    Thank you  for choosing [facility]. We consider it a privelige to serve you.  Delivering excellent health care in a caring and  compassionate way is our goal.  Partnering with you,  so that together we can achieve this goal is our strategy.

## 2018-01-16 NOTE — Assessment & Plan Note (Addendum)
Great toe pain and long nail, wants podiatry to help cut nail, will need all toenails cut by Podiatry , has deformity of all toes associated with her rheumatoid illness

## 2018-01-17 ENCOUNTER — Encounter: Payer: Self-pay | Admitting: Family Medicine

## 2018-01-17 DIAGNOSIS — E669 Obesity, unspecified: Secondary | ICD-10-CM | POA: Insufficient documentation

## 2018-01-17 DIAGNOSIS — E66811 Obesity, class 1: Secondary | ICD-10-CM | POA: Insufficient documentation

## 2018-01-17 NOTE — Progress Notes (Signed)
Paul Torpey     MRN: [id number]      DOB: 1946/07/02   HPI [name] is here for follow up and re-evaluation of chronic medical conditions, medication management and review of any available recent lab and radiology data.  Preventive health  specifically  Cancer screening and Immunization.  Are reviewed and are up to date . The PT denies any adverse reactions to current medications since the last visit.  C/o excessively long toenails and overlapping crossed toes and deformed toes, requesrs referral to Podiatrist for help with care of toenails, will refer   ROS Denies recent fever or chills. Denies sinus pressure, nasal congestion, ear pain or sore throat. Denies chest congestion, productive cough or wheezing. Denies chest pains, palpitations and leg swelling Denies abdominal pain, nausea, vomiting,diarrhea or constipation.   Denies dysuria, frequency, hesitancy or incontinence. Denies joint pain, does c/o  swelling and limitation in mobility. Denies headaches, seizures, numbness, or tingling. Denies depression, anxiety or insomnia. Denies skin break down or rash.   PE  BP 120/80   Pulse 73   Resp 16   Ht 5\' 1"  (1.549 m)   Wt 161 lb (73 kg)   SpO2 100%   BMI 30.42 kg/m   Patient alert and oriented and in no cardiopulmonary distress.  HEENT: No facial asymmetry, EOMI,   oropharynx pink and moist.  Neck decreased ROM no JVD, no mass.  Chest: Clear to auscultation bilaterally.  CVS: S1, S2 no murmurs, no S3.Regular rate.  ABD: Soft non tender.   Ext: No edema  MS: Adequate though reduced ROM spine, shoulders, hips and knees. Marked deformity and swelling of all finger joints   Skin: Intact, no ulcerations or rash noted.  Psych: Good eye contact, normal affect. Memory intact not anxious or depressed appearing.  CNS: CN 2-12 intact, power,  normal throughout.no focal deficits noted.   Assessment & Plan  Toe pain, left Great toe pain and long nail, wants  podiatry to help cut nail, will need all toenails cut by Podiatry , has deformity of all toes associated with her rheumatoid illness  HTN, goal below 140/80 Controlled, no change in medication DASH diet and commitment to daily physical activity for a minimum of 30 minutes discussed and encouraged, as a part of hypertension management. The importance of attaining a healthy weight is also discussed.  BP/Weight 01/16/2018 09/04/2017 08/05/2017 07/22/2017 03/20/2017 02/15/2017 7/56/4332  Systolic BP 951 884 166 063 016 010 932  Diastolic BP 80 90 90 80 82 82 80  Wt. (Lbs) 161 - 166 163 162.12 165 164  BMI 30.42 - 31.37 30.8 30.63 31.18 32.03       Obesity (BMI 30.0-34.9) Improved. Patient re-educated about  the importance of commitment to a  minimum of 150 minutes of exercise per week.  The importance of healthy food choices with portion control discussed. Encouraged to start a food diary, count calories and to consider  joining a support group. Sample diet sheets offered. Goals set by the patient for the next several months.   Weight /BMI 01/16/2018 08/05/2017 07/22/2017  WEIGHT 161 lb 166 lb 163 lb  HEIGHT 5\' 1"  5\' 1"  5\' 1"   BMI 30.42 kg/m2 31.37 kg/m2 30.8 kg/m2      Hyperlipidemia LDL goal <100 Hyperlipidemia:Low fat diet discussed and encouraged.   Lipid Panel  Lab Results  Component Value Date   CHOL 230 (H) 08/05/2017   HDL 54 08/05/2017   LDLCALC 147 (H) 08/05/2017   TRIG  154 (H) 08/05/2017   CHOLHDL 4.3 08/05/2017   Pt has decided against medication and is opting for dietary change , will review in 2020   Eczema of both hands Well controlled , skin intact and looks healthy

## 2018-01-17 NOTE — Assessment & Plan Note (Signed)
Hyperlipidemia:Low fat diet discussed and encouraged.   Lipid Panel  Lab Results  Component Value Date   CHOL 230 (H) 08/05/2017   HDL 54 08/05/2017   LDLCALC 147 (H) 08/05/2017   TRIG 154 (H) 08/05/2017   CHOLHDL 4.3 08/05/2017   Pt has decided against medication and is opting for dietary change , will review in 2020

## 2018-01-17 NOTE — Assessment & Plan Note (Signed)
Well controlled , skin intact and looks healthy

## 2018-01-17 NOTE — Assessment & Plan Note (Signed)
Controlled, no change in medication DASH diet and commitment to daily physical activity for a minimum of 30 minutes discussed and encouraged, as a part of hypertension management. The importance of attaining a healthy weight is also discussed.  BP/Weight 01/16/2018 09/04/2017 08/05/2017 07/22/2017 03/20/2017 02/15/2017 2/69/4854  Systolic BP 627 035 009 381 829 937 169  Diastolic BP 80 90 90 80 82 82 80  Wt. (Lbs) 161 - 166 163 162.12 165 164  BMI 30.42 - 31.37 30.8 30.63 31.18 32.03

## 2018-01-17 NOTE — Assessment & Plan Note (Signed)
Improved. Patient re-educated about  the importance of commitment to a  minimum of 150 minutes of exercise per week.  The importance of healthy food choices with portion control discussed. Encouraged to start a food diary, count calories and to consider  joining a support group. Sample diet sheets offered. Goals set by the patient for the next several months.   Weight /BMI 01/16/2018 08/05/2017 07/22/2017  WEIGHT 161 lb 166 lb 163 lb  HEIGHT 5\' 1"  5\' 1"  5\' 1"   BMI 30.42 kg/m2 31.37 kg/m2 30.8 kg/m2

## 2018-01-21 ENCOUNTER — Other Ambulatory Visit: Payer: Self-pay | Admitting: Family Medicine

## 2018-01-23 ENCOUNTER — Ambulatory Visit (HOSPITAL_COMMUNITY): Payer: Self-pay

## 2018-02-05 ENCOUNTER — Other Ambulatory Visit: Payer: Self-pay | Admitting: Family Medicine

## 2018-05-08 ENCOUNTER — Ambulatory Visit: Payer: Self-pay | Admitting: Family Medicine

## 2018-05-22 ENCOUNTER — Ambulatory Visit (INDEPENDENT_AMBULATORY_CARE_PROVIDER_SITE_OTHER): Payer: Medicare Other | Admitting: Family Medicine

## 2018-05-22 ENCOUNTER — Encounter: Payer: Self-pay | Admitting: Family Medicine

## 2018-05-22 ENCOUNTER — Other Ambulatory Visit: Payer: Self-pay

## 2018-05-22 VITALS — BP 126/80 | HR 80 | Resp 12 | Ht 60.5 in | Wt 165.1 lb

## 2018-05-22 DIAGNOSIS — Z1211 Encounter for screening for malignant neoplasm of colon: Secondary | ICD-10-CM

## 2018-05-22 DIAGNOSIS — Z23 Encounter for immunization: Secondary | ICD-10-CM

## 2018-05-22 DIAGNOSIS — L299 Pruritus, unspecified: Secondary | ICD-10-CM | POA: Diagnosis not present

## 2018-05-22 DIAGNOSIS — S50869S Insect bite (nonvenomous) of unspecified forearm, sequela: Secondary | ICD-10-CM

## 2018-05-22 DIAGNOSIS — F29 Unspecified psychosis not due to a substance or known physiological condition: Secondary | ICD-10-CM

## 2018-05-22 DIAGNOSIS — I10 Essential (primary) hypertension: Secondary | ICD-10-CM | POA: Diagnosis not present

## 2018-05-22 DIAGNOSIS — W57XXXS Bitten or stung by nonvenomous insect and other nonvenomous arthropods, sequela: Secondary | ICD-10-CM

## 2018-05-22 DIAGNOSIS — E559 Vitamin D deficiency, unspecified: Secondary | ICD-10-CM | POA: Diagnosis not present

## 2018-05-22 DIAGNOSIS — E669 Obesity, unspecified: Secondary | ICD-10-CM | POA: Diagnosis not present

## 2018-05-22 DIAGNOSIS — E785 Hyperlipidemia, unspecified: Secondary | ICD-10-CM | POA: Diagnosis not present

## 2018-05-22 MED ORDER — MUPIROCIN 2 % EX OINT: 1.0000 "application " | TOPICAL_OINTMENT | Freq: Two times a day (BID) | CUTANEOUS | 0 refills | Status: DC

## 2018-05-22 MED ORDER — NORTRIPTYLINE HCL 50 MG PO CAPS: 100.0000 mg | ORAL_CAPSULE | Freq: Every day | ORAL | 1 refills | Status: DC

## 2018-05-22 MED ORDER — CETIRIZINE HCL 10 MG PO TABS: 10.0000 mg | ORAL_TABLET | Freq: Every day | ORAL | 1 refills | Status: DC

## 2018-05-22 MED ORDER — HYDROXYZINE HCL 50 MG PO TABS: ORAL_TABLET | ORAL | 1 refills | Status: DC

## 2018-05-22 NOTE — Patient Instructions (Addendum)
Appointment in office already scheduled  Flu vaccine today  Antibiotic ointment sent in for you to use if you have skin infection for 5 days twice daily  Where insect bit you has fully healed  Please schedule her mammogram and give her appt info ( afternoon preferred)  Labs today, lipid, cmp and EGFR and vit D  And CBC  Please arrange cologuard test today,  No medication changes except I have sent in an antibiotic ointment to use only if needed, for minor skin infection

## 2018-05-25 ENCOUNTER — Encounter: Payer: Self-pay | Admitting: Family Medicine

## 2018-05-25 DIAGNOSIS — W57XXXA Bitten or stung by nonvenomous insect and other nonvenomous arthropods, initial encounter: Secondary | ICD-10-CM | POA: Insufficient documentation

## 2018-05-25 NOTE — Assessment & Plan Note (Signed)
Deteriorated. Patient re-educated about  the importance of commitment to a  minimum of 150 minutes of exercise per week.  The importance of healthy food choices with portion control discussed. Encouraged to start a food diary, count calories and to consider  joining a support group. Sample diet sheets offered. Goals set by the patient for the next several months.   Weight /BMI 05/22/2018 01/16/2018 08/05/2017  WEIGHT 165 lb 1.9 oz 161 lb 166 lb  HEIGHT 5' .5" 5\' 1"  5\' 1"   BMI 31.72 kg/m2 30.42 kg/m2 31.37 kg/m2

## 2018-05-25 NOTE — Assessment & Plan Note (Signed)
Skin protection and elimination of insects from her home environ is encouraged . Topical antibiotic prescribed in the event that she develops skin infection from future bites in the event that this recurs to a bothersome extent

## 2018-05-25 NOTE — Assessment & Plan Note (Signed)
Stable continue current medication

## 2018-05-25 NOTE — Assessment & Plan Note (Signed)
After obtaining informed consent, the vaccine is  administered by LPN.  

## 2018-05-25 NOTE — Assessment & Plan Note (Signed)
Controlled, no change in medication DASH diet and commitment to daily physical activity for a minimum of 30 minutes discussed and encouraged, as a part of hypertension management. The importance of attaining a healthy weight is also discussed.  BP/Weight 05/22/2018 01/16/2018 09/04/2017 08/05/2017 07/22/2017 03/20/2017 9/41/7919  Systolic BP 957 900 920 041 593 012 379  Diastolic BP 80 80 90 90 80 82 82  Wt. (Lbs) 165.12 161 - 166 163 162.12 165  BMI 31.72 30.42 - 31.37 30.8 30.63 31.18

## 2018-05-25 NOTE — Progress Notes (Signed)
Julia Stevens     MRN: [id number]      DOB: 1945-10-22   HPI [name] is here for follow up of recently acquired insect bites on both forearms, which she treated on her own , with topical alcohol and keeping areas clean. She used no antibiotic, and denies any pus or abscess formation. She is trying to get rid of the bugs, as she needs to.  She also has  re-evaluation of chronic medical conditions, medication management and review of any available recent lab and radiology data.  Preventive health is updated, specifically  Cancer screening and Immunization.    ROS Denies recent fever or chills. Denies sinus pressure, nasal congestion, ear pain or sore throat. Denies chest congestion, productive cough or wheezing. Denies chest pains, palpitations and leg swelling Denies abdominal pain, nausea, vomiting,diarrhea or constipation.   Denies dysuria, frequency, hesitancy or incontinence. Denies joint pain, swelling and limitation in mobility. Denies headaches, seizures, numbness, or tingling. C/o mild chronic  depression, not suicidal or homicidal, no interest in or need for therapyanxiety or insomnia.  PE  BP 126/80 (BP Location: Left Arm, Patient Position: Sitting, Cuff Size: Large)   Pulse 80   Resp 12   Ht 5' 0.5" (1.537 m)   Wt 165 lb 1.9 oz (74.9 kg)   SpO2 99% Comment: room air  BMI 31.72 kg/m   Patient alert and oriented and in no cardiopulmonary distress.  HEENT: No facial asymmetry, EOMI,   oropharynx pink and moist.  Neck supple no JVD, no mass.  Chest: Clear to auscultation bilaterally.  CVS: S1, S2 no murmurs, no S3.Regular rate.  ABD: Soft non tender.   Ext: No edema  MS: Adequate ROM spine, shoulders, hips and knees.  Skin: Intact, no ulcerations or rash noted.Scars present on forearms where pt had recent insect bites, no erythema or drainage  Psych: Good eye contact, normal affect. Memory intact not anxious or depressed appearing.  CNS: CN 2-12 intact,  power,  normal throughout.no focal deficits noted.   Assessment & Plan  HTN, goal below 140/80 Controlled, no change in medication DASH diet and commitment to daily physical activity for a minimum of 30 minutes discussed and encouraged, as a part of hypertension management. The importance of attaining a healthy weight is also discussed.  BP/Weight 05/22/2018 01/16/2018 09/04/2017 08/05/2017 07/22/2017 03/20/2017 8/36/6294  Systolic BP 765 465 035 465 681 275 170  Diastolic BP 80 80 90 90 80 82 82  Wt. (Lbs) 165.12 161 - 166 163 162.12 165  BMI 31.72 30.42 - 31.37 30.8 30.63 31.18       Nonorganic psychosis Stable continue current medication  Obesity (BMI 30.0-34.9) Deteriorated. Patient re-educated about  the importance of commitment to a  minimum of 150 minutes of exercise per week.  The importance of healthy food choices with portion control discussed. Encouraged to start a food diary, count calories and to consider  joining a support group. Sample diet sheets offered. Goals set by the patient for the next several months.   Weight /BMI 05/22/2018 01/16/2018 08/05/2017  WEIGHT 165 lb 1.9 oz 161 lb 166 lb  HEIGHT 5' .5" 5\' 1"  5\' 1"   BMI 31.72 kg/m2 30.42 kg/m2 31.37 kg/m2      Insect bites Skin protection and elimination of insects from her home environ is encouraged . Topical antibiotic prescribed in the event that she develops skin infection from future bites in the event that this recurs to a bothersome extent  Need for  immunization against influenza After obtaining informed consent, the vaccine is  administered by LPN.

## 2018-05-29 ENCOUNTER — Telehealth: Payer: Self-pay | Admitting: Family Medicine

## 2018-05-29 NOTE — Telephone Encounter (Signed)
Patient left voicemail requesting me to make her an appt at the lab. Patient cb#: [phone]  appt --> 06/02/18 at 11am Code: [id number] Lab phone #: [phone]  Patient is aware

## 2018-06-02 DIAGNOSIS — E559 Vitamin D deficiency, unspecified: Secondary | ICD-10-CM | POA: Diagnosis not present

## 2018-06-02 DIAGNOSIS — I10 Essential (primary) hypertension: Secondary | ICD-10-CM | POA: Diagnosis not present

## 2018-06-02 DIAGNOSIS — E785 Hyperlipidemia, unspecified: Secondary | ICD-10-CM | POA: Diagnosis not present

## 2018-06-03 LAB — COMPLETE METABOLIC PANEL WITH GFR
AG RATIO: 1.1 (calc) (ref 1.0–2.5)
ALKALINE PHOSPHATASE (APISO): 61 U/L (ref 33–130)
ALT: 17 U/L (ref 6–29)
AST: 28 U/L (ref 10–35)
Albumin: 3.9 g/dL (ref 3.6–5.1)
BUN/Creatinine Ratio: 16 (calc) (ref 6–22)
BUN: 16 mg/dL (ref 7–25)
CO2: 28 mmol/L (ref 20–32)
Calcium: 9 mg/dL (ref 8.6–10.4)
Chloride: 105 mmol/L (ref 98–110)
Creat: 1.01 mg/dL — ABNORMAL HIGH (ref 0.60–0.93)
GFR, Est African American: 65 mL/min/{1.73_m2} (ref >=60)
GFR, Est Non African American: 56 mL/min/{1.73_m2} — ABNORMAL LOW (ref >=60)
GLOBULIN: 3.7 g/dL (ref 1.9–3.7)
Glucose, Bld: 89 mg/dL (ref 65–99)
POTASSIUM: 4 mmol/L (ref 3.5–5.3)
SODIUM: 140 mmol/L (ref 135–146)
Total Bilirubin: 0.4 mg/dL (ref 0.2–1.2)
Total Protein: 7.6 g/dL (ref 6.1–8.1)

## 2018-06-03 LAB — CBC
HCT: 36.9 % (ref 35.0–45.0)
Hemoglobin: 12.5 g/dL (ref 11.7–15.5)
MCH: 29.7 pg (ref 27.0–33.0)
MCHC: 33.9 g/dL (ref 32.0–36.0)
MCV: 87.6 fL (ref 80.0–100.0)
MPV: 10.7 fL (ref 7.5–12.5)
PLATELETS: 250 10*3/uL (ref 140–400)
RBC: 4.21 10*6/uL (ref 3.80–5.10)
RDW: 13.5 % (ref 11.0–15.0)
WBC: 4 10*3/uL (ref 3.8–10.8)

## 2018-06-03 LAB — LIPID PANEL
CHOLESTEROL: 223 mg/dL — AB (ref <200)
HDL: 45 mg/dL — AB (ref >50)
LDL CHOLESTEROL (CALC): 151 mg/dL — AB
NON-HDL CHOLESTEROL (CALC): 178 mg/dL — AB (ref <130)
Total CHOL/HDL Ratio: 5 (calc) — ABNORMAL HIGH (ref <5.0)
Triglycerides: 143 mg/dL (ref <150)

## 2018-06-03 LAB — VITAMIN D 25 HYDROXY (VIT D DEFICIENCY, FRACTURES): VIT D 25 HYDROXY: 34 ng/mL (ref 30–100)

## 2018-06-07 ENCOUNTER — Other Ambulatory Visit: Payer: Self-pay | Admitting: Family Medicine

## 2018-06-07 MED ORDER — ROSUVASTATIN CALCIUM 10 MG PO TABS: 10.0000 mg | ORAL_TABLET | Freq: Every day | ORAL | 3 refills | Status: DC

## 2018-07-02 DIAGNOSIS — Z1211 Encounter for screening for malignant neoplasm of colon: Secondary | ICD-10-CM | POA: Diagnosis not present

## 2018-07-09 LAB — COLOGUARD: Cologuard: NEGATIVE

## 2018-07-28 ENCOUNTER — Ambulatory Visit (INDEPENDENT_AMBULATORY_CARE_PROVIDER_SITE_OTHER): Payer: Medicare Other

## 2018-07-28 ENCOUNTER — Ambulatory Visit (HOSPITAL_COMMUNITY)
Admission: RE | Admit: 2018-07-28 | Discharge: 2018-07-28 | Disposition: A | Discharge status: Home / Self Care | Payer: Medicare Other | Source: Ambulatory Visit | Attending: Family Medicine | Admitting: Family Medicine

## 2018-07-28 VITALS — BP 139/90 | HR 88 | Resp 12 | Ht 60.0 in | Wt 164.0 lb

## 2018-07-28 DIAGNOSIS — Z1231 Encounter for screening mammogram for malignant neoplasm of breast: Secondary | ICD-10-CM | POA: Insufficient documentation

## 2018-07-28 DIAGNOSIS — Z0000 Encounter for general adult medical examination without abnormal findings: Secondary | ICD-10-CM | POA: Diagnosis not present

## 2018-07-28 NOTE — Progress Notes (Signed)
Subjective:   Julia Stevens is a 72 y.o. female who presents for Medicare Annual (Subsequent) preventive examination.  Review of Systems:   Cardiac Risk Factors include: advanced age (>41men, >39 women);hypertension;dyslipidemia;obesity (BMI >30kg/m2)     Objective:     Vitals: BP 139/90   Pulse 88   Resp 12   Ht 5' (1.524 m)   Wt 164 lb (74.4 kg)   SpO2 99%   BMI 32.03 kg/m   Body mass index is 32.03 kg/m.  Advanced Directives 07/28/2018 07/22/2017 02/15/2017 12/25/2013  Does Patient Have a Medical Advance Directive? No No No Patient does not have advance directive;Patient would not like information  Would patient like information on creating a medical advance directive? No - Patient declined No - Patient declined No - Patient declined -  Pre-existing out of facility DNR order (yellow form or pink MOST form) - - - No    Tobacco Social History   Tobacco Use  Smoking Status Never Smoker  Smokeless Tobacco Never Used     Counseling given: Not Answered   Clinical Intake:  Pre-visit preparation completed: Yes  Pain : No/denies pain Pain Score: 0-No pain     BMI - recorded: 32 Nutritional Status: BMI > 30  Obese Nutritional Risks: None Diabetes: No  How often do you need to have someone help you when you read instructions, pamphlets, or other written materials from your doctor or pharmacy?: 2 - Rarely What is the last grade level you completed in school?: 12 grade   Interpreter Needed?: No  Information entered by :: Francena Hanly LPN   Past Medical History:  Diagnosis Date  . Arthritis   . Dysphagia   . Eczema   . Hyperlipidemia   . Hypertension   . Psychosis ([place])   . Scleroderma ([place])   . Shingles    Past Surgical History:  Procedure Laterality Date  . COLONOSCOPY  06/26/2000   [name],   . COLONOSCOPY N/A 12/25/2013   Procedure: COLONOSCOPY;  Surgeon: Danie Binder, MD;  Location: [facility];  Service: Endoscopy;  Laterality: N/A;  10:45AM  .  TOTAL ABDOMINAL HYSTERECTOMY W/ BILATERAL SALPINGOOPHORECTOMY  1994   Family History  Problem Relation Age of Onset  . Hypertension Mother   . Heart disease Mother        CAD  . Hypertension Father         Severe DJD/ macrobacterium  . Hypertension Sister   . Hypertension Sister   . Fibromyalgia Sister   . Diabetes Brother   . Colon cancer Neg Hx    Social History   Socioeconomic History  . Marital status: Single    Spouse name: Not on file  . Number of children: 0  . Years of education: 15  . Highest education level: 12th grade  Occupational History  . Occupation: disabled     Fish farm manager: UNEMPLOYED  Social Needs  . Financial resource strain: Not very hard  . Food insecurity:    Worry: Never true    Inability: Never true  . Transportation needs:    Medical: Yes    Non-medical: Yes  Tobacco Use  . Smoking status: Never Smoker  . Smokeless tobacco: Never Used  Substance and Sexual Activity  . Alcohol use: No  . Drug use: No  . Sexual activity: Not Currently    Birth control/protection: Surgical  Lifestyle  . Physical activity:    Days per week: 2 days    Minutes per session: 30  min  . Stress: To some extent  Relationships  . Social connections:    Talks on phone: More than three times a week    Gets together: Once a week    Attends religious service: 1 to 4 times per year    Active member of club or organization: No    Attends meetings of clubs or organizations: Never    Relationship status: Never married  Other Topics Concern  . Not on file  Social History Narrative   Lives alone in apartment     Outpatient Encounter Medications as of 07/28/2018  Medication Sig  . aspirin EC 81 MG tablet Take 81 mg by mouth daily.  . calcium carbonate (OS-CAL) 600 MG TABS tablet Take 600 mg by mouth 2 (two) times daily with a meal.  . cetirizine (ZYRTEC) 10 MG tablet Take 1 tablet (10 mg total) by mouth daily.  Marland Kitchen diltiazem (CARDIZEM CD) 300 MG 24 hr capsule TAKE 1  CAPSULE BY MOUTH DAILY.  . hydrochlorothiazide (HYDRODIURIL) 25 MG tablet TAKE ONE TABLET BY MOUTH DAILY.  . hydrOXYzine (ATARAX/VISTARIL) 50 MG tablet TAKE (1) TABLET BY MOUTH AT BEDTIME.  . Multiple Vitamin (MULTIVITAMIN) capsule Take 1 capsule by mouth daily.    . mupirocin ointment (BACTROBAN) 2 % Place 1 application into the nose 2 (two) times daily.  . nortriptyline (PAMELOR) 50 MG capsule Take 2 capsules (100 mg total) by mouth at bedtime.  . potassium chloride (K-DUR) 10 MEQ tablet Take 1 tablet (10 mEq total) by mouth 2 (two) times daily.  . rosuvastatin (CRESTOR) 10 MG tablet Take 1 tablet (10 mg total) by mouth daily.   No facility-administered encounter medications on file as of 07/28/2018.     Activities of Daily Living In your present state of health, do you have any difficulty performing the following activities: 07/28/2018  Hearing? N  Vision? N  Difficulty concentrating or making decisions? Y  Walking or climbing stairs? Y  Dressing or bathing? N  Doing errands, shopping? N  Preparing Food and eating ? N  Using the Toilet? N  In the past six months, have you accidently leaked urine? Y  Do you have problems with loss of bowel control? N  Managing your Medications? N  Managing your Finances? N  Housekeeping or managing your Housekeeping? N  Some recent data might be hidden    Patient Care Team: Fayrene Helper, MD as PCP - [facility], Marga Melnick, MD as Consulting Physician (Gastroenterology)    Assessment:   This is a routine wellness examination for Julia Stevens.  Exercise Activities and Dietary recommendations Current Exercise Habits: Home exercise routine, Type of exercise: walking, Time (Minutes): 20, Frequency (Times/Week): 2, Weekly Exercise (Minutes/Week): 40, Intensity: Mild, Exercise limited by: cardiac condition(s);orthopedic condition(s)  Goals    . DIET - DECREASE SODA OR JUICE INTAKE    . DIET - INCREASE WATER INTAKE       Fall Risk Fall Risk   07/28/2018 05/22/2018 01/16/2018 07/22/2017 03/20/2017  Falls in the past year? 1 Yes Yes Yes No  Number falls in past yr: 0 1 1 1  -  Injury with Fall? 0 No - No -  Risk for fall due to : Impaired vision;Impaired balance/gait - - - -  Follow up Falls prevention discussed - - Falls prevention discussed -   Is the patient's home free of loose throw rugs in walkways, pet beds, electrical cords, etc?   yes      Grab bars in  the bathroom? yes      Handrails on the stairs?   yes      Adequate lighting?   yes  Timed Get Up and Go performed: Patient able to perform in 6 seconds without assistance   Depression Screen PHQ 2/9 Scores 07/28/2018 05/22/2018 01/16/2018 07/22/2017  PHQ - 2 Score 0 4 1 0  PHQ- 9 Score - 10 8 -     Cognitive Function     6CIT Screen 07/28/2018 07/22/2017  What Year? 0 points 0 points  What month? 0 points 0 points  What time? 0 points 0 points  Count back from 20 0 points 0 points  Months in reverse 4 points 4 points  Repeat phrase 0 points 2 points  Total Score 4 6    Immunization History  Administered Date(s) Administered  . 19-influenza Whole 07/23/2011  . Influenza Split 07/28/2012, 06/22/2014  . Influenza Whole 07/04/2007, 06/29/2009, 07/10/2010  . Influenza,inj,Quad PF,6+ Mos 05/20/2013, 07/05/2015, 07/20/2016, 07/22/2017, 05/22/2018  . Pneumococcal Conjugate-13 03/01/2014  . Pneumococcal Polysaccharide-23 08/21/2013  . Td 01/04/2004  . Tdap 07/23/2011  . Zoster 09/22/2013    Qualifies for Shingles Vaccine? Up to date   Screening Tests Health Maintenance  Topic Date Due  . MAMMOGRAM  09/14/2018  . TETANUS/TDAP  07/22/2021  . COLONOSCOPY  12/26/2023  . INFLUENZA VACCINE  Completed  . DEXA SCAN  Completed  . Hepatitis C Screening  Completed  . PNA vac Low Risk Adult  Completed    Cancer Screenings: Lung: Low Dose CT Chest recommended if Age 19-80 years, 30 pack-year currently smoking OR have quit w/in 15years. Patient does not qualify. Breast:   Up to date on Mammogram? Yes   Up to date of Bone Density/Dexa? Yes Colorectal: Up to date   Additional Screenings: : Hepatitis C Screening: Complete      Plan:   Continue to exercise, decrease soda intake and increase water intake   I have personally reviewed and noted the following in the patient's chart:   . Medical and social history . Use of alcohol, tobacco or illicit drugs  . Current medications and supplements . Functional ability and status . Nutritional status . Physical activity . Advanced directives . List of other physicians . Hospitalizations, surgeries, and ER visits in previous 12 months . Vitals . Screenings to include cognitive, depression, and falls . Referrals and appointments  In addition, I have reviewed and discussed with patient certain preventive protocols, quality metrics, and best practice recommendations. A written personalized care plan for preventive services as well as general preventive health recommendations were provided to patient.     Hayden Pedro, LPN  17/11/812

## 2018-07-28 NOTE — Patient Instructions (Signed)
Julia Stevens , Thank you for taking time to come for your Medicare Wellness Visit. I appreciate your ongoing commitment to your health goals. Please review the following plan we discussed and let me know if I can assist you in the future.   Screening recommendations/referrals: Colonoscopy: up to date  Mammogram: up to date  Bone Density: up to date  Recommended yearly ophthalmology/optometry visit for glaucoma screening and checkup Recommended yearly dental visit for hygiene and checkup  Vaccinations: Influenza vaccine: up to date  Pneumococcal vaccine: up to date  Tdap vaccine: up to date  Shingles vaccine: up to date    Advanced directives: information provided   Conditions/risks identified: Hypertension, RA   Next appointment: Wellness visit in one year    Preventive Care 39 Years and Older, Female Preventive care refers to lifestyle choices and visits with your health care provider that can promote health and wellness. What does preventive care include?  A yearly physical exam. This is also called an annual well check.  Dental exams once or twice a year.  Routine eye exams. Ask your health care provider how often you should have your eyes checked.  Personal lifestyle choices, including:  Daily care of your teeth and gums.  Regular physical activity.  Eating a healthy diet.  Avoiding tobacco and drug use.  Limiting alcohol use.  Practicing safe sex.  Taking low-dose aspirin every day.  Taking vitamin and mineral supplements as recommended by your health care provider. What happens during an annual well check? The services and screenings done by your health care provider during your annual well check will depend on your age, overall health, lifestyle risk factors, and family history of disease. Counseling  Your health care provider may ask you questions about your:  Alcohol use.  Tobacco use.  Drug use.  Emotional well-being.  Home and relationship  well-being.  Sexual activity.  Eating habits.  History of falls.  Memory and ability to understand (cognition).  Work and work Statistician.  Reproductive health. Screening  You may have the following tests or measurements:  Height, weight, and BMI.  Blood pressure.  Lipid and cholesterol levels. These may be checked every 5 years, or more frequently if you are over 26 years old.  Skin check.  Lung cancer screening. You may have this screening every year starting at age 23 if you have a 30-pack-year history of smoking and currently smoke or have quit within the past 15 years.  Fecal occult blood test (FOBT) of the stool. You may have this test every year starting at age 35.  Flexible sigmoidoscopy or colonoscopy. You may have a sigmoidoscopy every 5 years or a colonoscopy every 10 years starting at age 64.  Hepatitis C blood test.  Hepatitis B blood test.  Sexually transmitted disease (STD) testing.  Diabetes screening. This is done by checking your blood sugar (glucose) after you have not eaten for a while (fasting). You may have this done every 1-3 years.  Bone density scan. This is done to screen for osteoporosis. You may have this done starting at age 22.  Mammogram. This may be done every 1-2 years. Talk to your health care provider about how often you should have regular mammograms. Talk with your health care provider about your test results, treatment options, and if necessary, the need for more tests. Vaccines  Your health care provider may recommend certain vaccines, such as:  Influenza vaccine. This is recommended every year.  Tetanus, diphtheria, and acellular pertussis (  Tdap, Td) vaccine. You may need a Td booster every 10 years.  Zoster vaccine. You may need this after age 56.  Pneumococcal 13-valent conjugate (PCV13) vaccine. One dose is recommended after age 22.  Pneumococcal polysaccharide (PPSV23) vaccine. One dose is recommended after age  51. Talk to your health care provider about which screenings and vaccines you need and how often you need them. This information is not intended to replace advice given to you by your health care provider. Make sure you discuss any questions you have with your health care provider. Document Released: 09/02/2015 Document Revised: 04/25/2016 Document Reviewed: 06/07/2015 Elsevier Interactive Patient Education  2017 [place] Prevention in the Home Falls can cause injuries. They can happen to people of all ages. There are many things you can do to make your home safe and to help prevent falls. What can I do on the outside of my home?  Regularly fix the edges of walkways and driveways and fix any cracks.  Remove anything that might make you trip as you walk through a door, such as a raised step or threshold.  Trim any bushes or trees on the path to your home.  Use bright outdoor lighting.  Clear any walking paths of anything that might make someone trip, such as rocks or tools.  Regularly check to see if handrails are loose or broken. Make sure that both sides of any steps have handrails.  Any raised decks and porches should have guardrails on the edges.  Have any leaves, snow, or ice cleared regularly.  Use sand or salt on walking paths during winter.  Clean up any spills in your garage right away. This includes oil or grease spills. What can I do in the bathroom?  Use night lights.  Install grab bars by the toilet and in the tub and shower. Do not use towel bars as grab bars.  Use non-skid mats or decals in the tub or shower.  If you need to sit down in the shower, use a plastic, non-slip stool.  Keep the floor dry. Clean up any water that spills on the floor as soon as it happens.  Remove soap buildup in the tub or shower regularly.  Attach bath mats securely with double-sided non-slip rug tape.  Do not have throw rugs and other things on the floor that can make  you trip. What can I do in the bedroom?  Use night lights.  Make sure that you have a light by your bed that is easy to reach.  Do not use any sheets or blankets that are too big for your bed. They should not hang down onto the floor.  Have a firm chair that has side arms. You can use this for support while you get dressed.  Do not have throw rugs and other things on the floor that can make you trip. What can I do in the kitchen?  Clean up any spills right away.  Avoid walking on wet floors.  Keep items that you use a lot in easy-to-reach places.  If you need to reach something above you, use a strong step stool that has a grab bar.  Keep electrical cords out of the way.  Do not use floor polish or wax that makes floors slippery. If you must use wax, use non-skid floor wax.  Do not have throw rugs and other things on the floor that can make you trip. What can I do with my stairs?  Do  not leave any items on the stairs.  Make sure that there are handrails on both sides of the stairs and use them. Fix handrails that are broken or loose. Make sure that handrails are as long as the stairways.  Check any carpeting to make sure that it is firmly attached to the stairs. Fix any carpet that is loose or worn.  Avoid having throw rugs at the top or bottom of the stairs. If you do have throw rugs, attach them to the floor with carpet tape.  Make sure that you have a light switch at the top of the stairs and the bottom of the stairs. If you do not have them, ask someone to add them for you. What else can I do to help prevent falls?  Wear shoes that:  Do not have high heels.  Have rubber bottoms.  Are comfortable and fit you well.  Are closed at the toe. Do not wear sandals.  If you use a stepladder:  Make sure that it is fully opened. Do not climb a closed stepladder.  Make sure that both sides of the stepladder are locked into place.  Ask someone to hold it for you, if  possible.  Clearly mark and make sure that you can see:  Any grab bars or handrails.  First and last steps.  Where the edge of each step is.  Use tools that help you move around (mobility aids) if they are needed. These include:  Canes.  Walkers.  Scooters.  Crutches.  Turn on the lights when you go into a dark area. Replace any light bulbs as soon as they burn out.  Set up your furniture so you have a clear path. Avoid moving your furniture around.  If any of your floors are uneven, fix them.  If there are any pets around you, be aware of where they are.  Review your medicines with your doctor. Some medicines can make you feel dizzy. This can increase your chance of falling. Ask your doctor what other things that you can do to help prevent falls. This information is not intended to replace advice given to you by your health care provider. Make sure you discuss any questions you have with your health care provider. Document Released: 06/02/2009 Document Revised: 01/12/2016 Document Reviewed: 09/10/2014 Elsevier Interactive Patient Education  2017 [employer].

## 2018-09-17 ENCOUNTER — Telehealth: Payer: Self-pay | Admitting: Family Medicine

## 2018-09-17 ENCOUNTER — Other Ambulatory Visit: Payer: Self-pay | Admitting: Family Medicine

## 2018-09-17 NOTE — Telephone Encounter (Signed)
Pt LVM that she doesn't believe she needs to be taken the CRESTOR. Please call her [phone]

## 2018-09-17 NOTE — Telephone Encounter (Signed)
Spoke with patient and she has not been taking the crestor that was prescribed in October because she didn't think she need it. I went over her last lab results with her and explained why you prescribed it. She agreed to go ahead and take the Crestor until she has lab work done again and another office visit.

## 2018-10-23 ENCOUNTER — Other Ambulatory Visit: Payer: Self-pay | Admitting: Family Medicine

## 2018-11-03 ENCOUNTER — Other Ambulatory Visit: Payer: Self-pay

## 2018-11-03 ENCOUNTER — Encounter: Payer: Self-pay | Admitting: Family Medicine

## 2018-11-03 ENCOUNTER — Ambulatory Visit (INDEPENDENT_AMBULATORY_CARE_PROVIDER_SITE_OTHER): Payer: Medicare Other | Admitting: Family Medicine

## 2018-11-03 VITALS — BP 114/74 | HR 86 | Temp 98.0°F | Resp 16 | Ht 60.5 in | Wt 171.0 lb

## 2018-11-03 DIAGNOSIS — F29 Unspecified psychosis not due to a substance or known physiological condition: Secondary | ICD-10-CM

## 2018-11-03 DIAGNOSIS — E669 Obesity, unspecified: Secondary | ICD-10-CM | POA: Diagnosis not present

## 2018-11-03 DIAGNOSIS — E785 Hyperlipidemia, unspecified: Secondary | ICD-10-CM

## 2018-11-03 DIAGNOSIS — I10 Essential (primary) hypertension: Secondary | ICD-10-CM

## 2018-11-03 DIAGNOSIS — M542 Cervicalgia: Secondary | ICD-10-CM

## 2018-11-03 MED ORDER — IBUPROFEN 600 MG PO TABS: ORAL_TABLET | ORAL | 3 refills | Status: DC

## 2018-11-03 MED ORDER — FAMOTIDINE 40 MG PO TABS: 40.0000 mg | ORAL_TABLET | Freq: Every day | ORAL | 0 refills | Status: DC

## 2018-11-03 MED ORDER — PREDNISONE 5 MG (21) PO TBPK: 5.0000 mg | ORAL_TABLET | ORAL | 0 refills | Status: DC

## 2018-11-03 NOTE — Assessment & Plan Note (Signed)
2 week history after lifting heavy objects Uncontrolled.Toradol and depo medrol administered IM in the office , to be followed by a short course of oral prednisone and NSAIDS.

## 2018-11-03 NOTE — Patient Instructions (Signed)
F/U  In 4 months, call if you need me sooner  Fasting lipid, cmp and eGFr next week   3 medications sent for neck and left shoulder pain, and please get X ray of neck today  Please call if yu you need to see Orhtopedics [facility] [place] ) re left shoulder  I do recommend Rheumatrology appt in [place], call when you decide on thi splease

## 2018-11-08 NOTE — Assessment & Plan Note (Signed)
Deteriorated.  Patient re-educated about  the importance of commitment to a  minimum of 150 minutes of exercise per week as able.  The importance of healthy food choices with portion control discussed, as well as eating regularly and within a 12 hour window most days. The need to choose "clean , green" food 50 to 75% of the time is discussed, as well as to make water the primary drink and set a goal of 64 ounces water daily.  Encouraged to start a food diary,  and to consider  joining a support group. Sample diet sheets offered. Goals set by the patient for the next several months.   Weight /BMI 11/03/2018 07/28/2018 05/22/2018  WEIGHT 171 lb 164 lb 165 lb 1.9 oz  HEIGHT 5' .5" 5\' 0"  5' .5"  BMI 32.85 kg/m2 32.03 kg/m2 31.72 kg/m2

## 2018-11-08 NOTE — Progress Notes (Signed)
Julia Stevens     MRN: [id number]      DOB: Jul 27, 1946   HPI Ms. Bribiesca is here for follow up and re-evaluation of chronic medical conditions, medication management and review of any available recent lab and radiology data.  Preventive health is updated, specifically  Cancer screening and Immunization.   Questions or concerns regarding consultations or procedures which the PT has had in the interim are  addressed. The PT denies any adverse reactions to current medications since the last visit.   C/o shoulder and neck pain ROS Denies recent fever or chills. Denies sinus pressure, nasal congestion, ear pain or sore throat. Denies chest congestion, productive cough or wheezing. Denies chest pains, palpitations and leg swelling Denies abdominal pain, nausea, vomiting,diarrhea or constipation.   Denies dysuria, frequency, hesitancy or incontinence.  Denies headaches, seizures, numbness, or tingling. Denies depression, anxiety or insomnia. Denies skin break down or rash.   PE  BP 114/74   Pulse 86   Temp 98 F (36.7 C) (Oral)   Resp 16   Ht 5' 0.5" (1.537 m)   Wt 171 lb (77.6 kg)   SpO2 98%   BMI 32.85 kg/m   Patient alert and oriented and in no cardiopulmonary distress.  HEENT: No facial asymmetry, EOMI,   oropharynx pink and moist.  Neck decreased ROM no JVD, no mass.  Chest: Clear to auscultation bilaterally.  CVS: S1, S2 no murmurs, no S3.Regular rate.  ABD: Soft non tender.   Ext: No edema  MS: Adequate ROM spine,  Decreased ROM shoulder adequate in  hips and knees.  Skin: Intact, no ulcerations or rash noted.  Psych: Good eye contact, normal affect. Memory intact not anxious or depressed appearing.  CNS: CN 2-12 intact, power,  normal throughout.no focal deficits noted.   Assessment & Plan  Neck pain on left side 2 week history after lifting heavy objects Uncontrolled.Toradol and depo medrol administered IM in the office , to be followed by a short  course of oral prednisone and NSAIDS.   HTN, goal below 140/80 Controlled, no change in medication DASH diet and commitment to daily physical activity for a minimum of 30 minutes discussed and encouraged, as a part of hypertension management. The importance of attaining a healthy weight is also discussed.  BP/Weight 11/03/2018 07/28/2018 05/22/2018 01/16/2018 09/04/2017 08/05/2017 23/02/6282  Systolic BP 151 [phone]  Diastolic BP 74 90 80 80 90 90 80  Wt. (Lbs) 171 164 165.12 161 - 166 163  BMI 32.85 32.03 31.72 30.42 - 31.37 30.8       Hyperlipidemia LDL goal <100 Hyperlipidemia:Low fat diet discussed and encouraged.   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  Patient re-educated about  the importance of commitment to a  minimum of 150 minutes of exercise per week as able.  The importance of healthy food choices with portion control discussed, as well as eating regularly and within a 12 hour window most days. The need to choose "clean , green" food 50 to 75% of the time is discussed, as well as to make water the primary drink and set a goal of 64 ounces water daily.  Encouraged to start  a food diary,  and to consider  joining a support group. Sample diet sheets offered. Goals set by the patient for the next several months.   Weight /BMI 11/03/2018 07/28/2018 05/22/2018  WEIGHT 171 lb 164 lb 165 lb 1.9 oz  HEIGHT 5' .5" 5\' 0"  5' .5"  BMI 32.85 kg/m2 32.03 kg/m2 31.72 kg/m2

## 2018-11-08 NOTE — Assessment & Plan Note (Signed)
Controlled, no change in medication  

## 2018-11-08 NOTE — Assessment & Plan Note (Signed)
Controlled, no change in medication DASH diet and commitment to daily physical activity for a minimum of 30 minutes discussed and encouraged, as a part of hypertension management. The importance of attaining a healthy weight is also discussed.  BP/Weight 11/03/2018 07/28/2018 05/22/2018 01/16/2018 09/04/2017 08/05/2017 07/27/2080  Systolic BP 388 719 [phone] 868  Diastolic BP 74 90 80 80 90 90 80  Wt. (Lbs) 171 164 165.12 161 - 166 163  BMI 32.85 32.03 31.72 30.42 - 31.37 30.8

## 2018-11-08 NOTE — Assessment & Plan Note (Signed)
Hyperlipidemia:Low fat diet discussed and encouraged.   Lipid Panel  Lab Results  Component Value Date   CHOL 223 (H) 06/02/2018   HDL 45 (L) 06/02/2018   LDLCALC 151 (H) 06/02/2018   TRIG 143 06/02/2018   CHOLHDL 5.0 (H) 06/02/2018

## 2018-12-08 IMAGING — MG 2D DIGITAL SCREENING BILATERAL MAMMOGRAM WITH CAD AND ADJUNCT TO
8 of 11 series · 8 of 27 positions shown · non-contrast
Comparison: Previous exam(s).

CLINICAL DATA: Screening.

EXAM:
2D DIGITAL SCREENING BILATERAL MAMMOGRAM WITH CAD AND ADJUNCT TOMO

[L CC (1 of 2)]
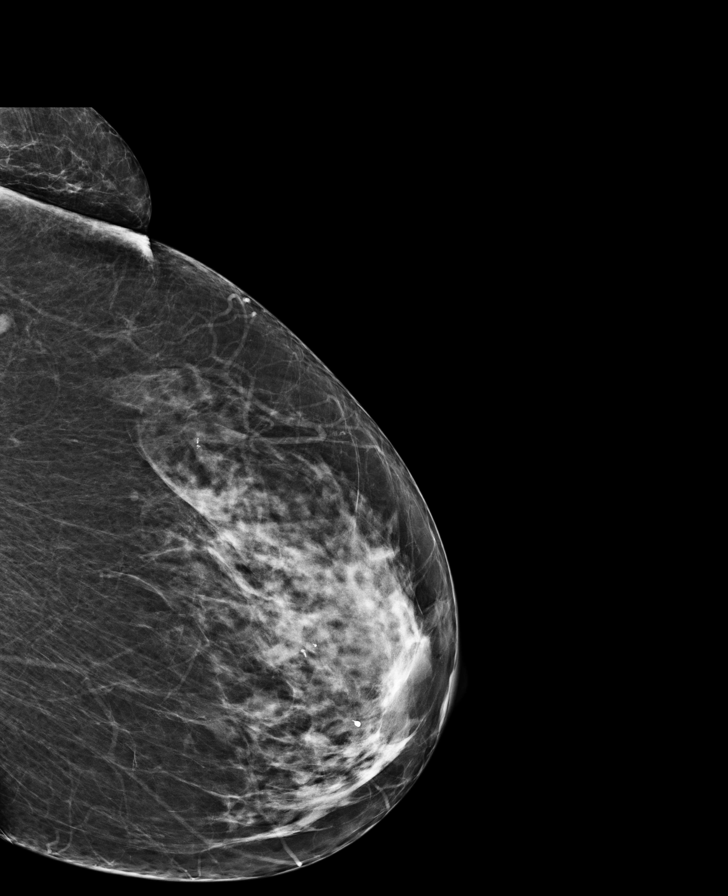

[L MLO (1 of 2)]
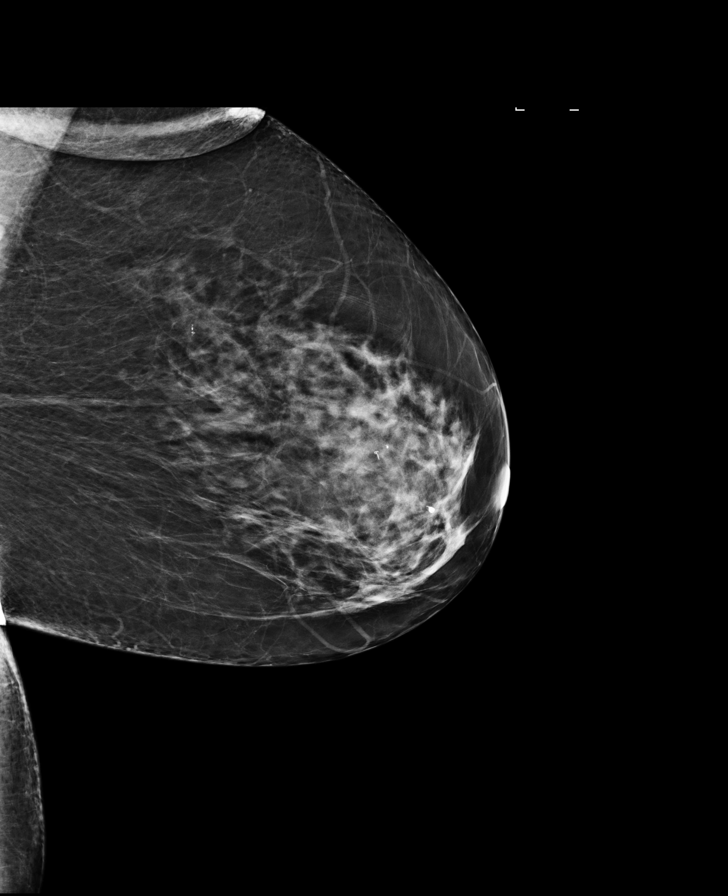

[R MLO (1 of 2)]
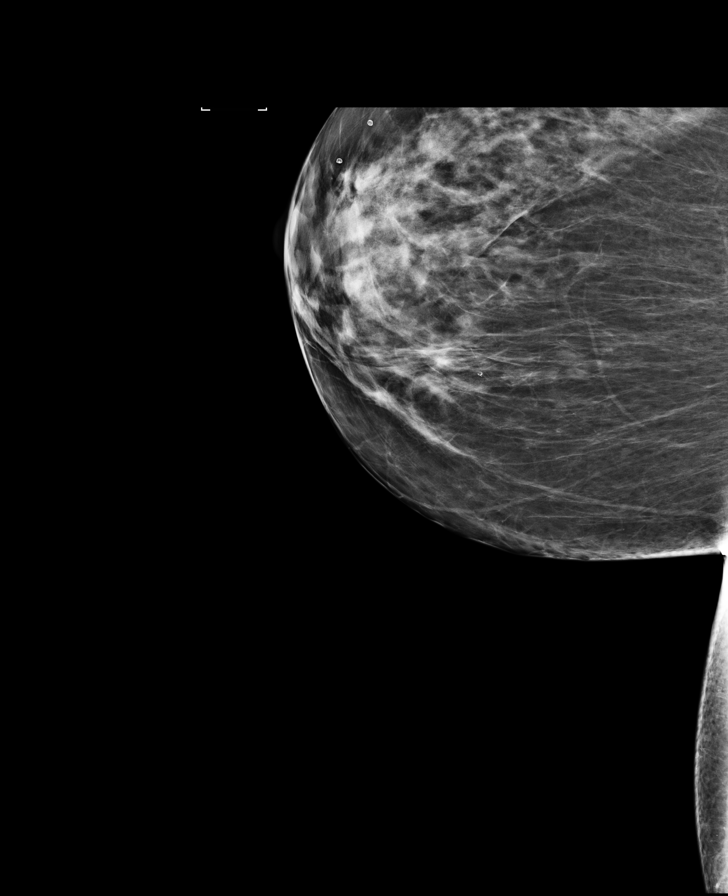

[L CC (2 of 2)]
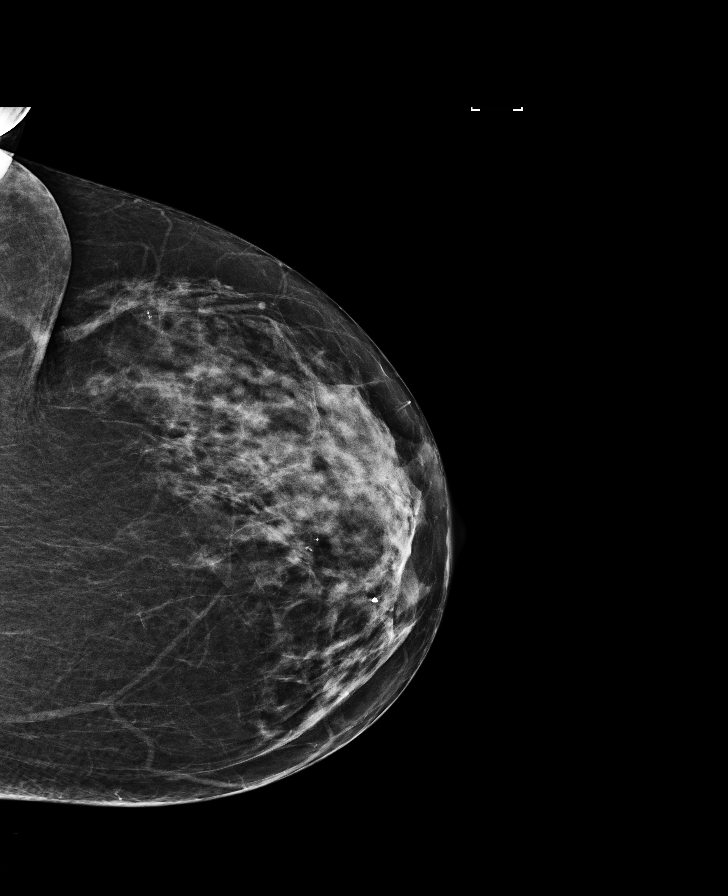

[R MLO (2 of 2)]
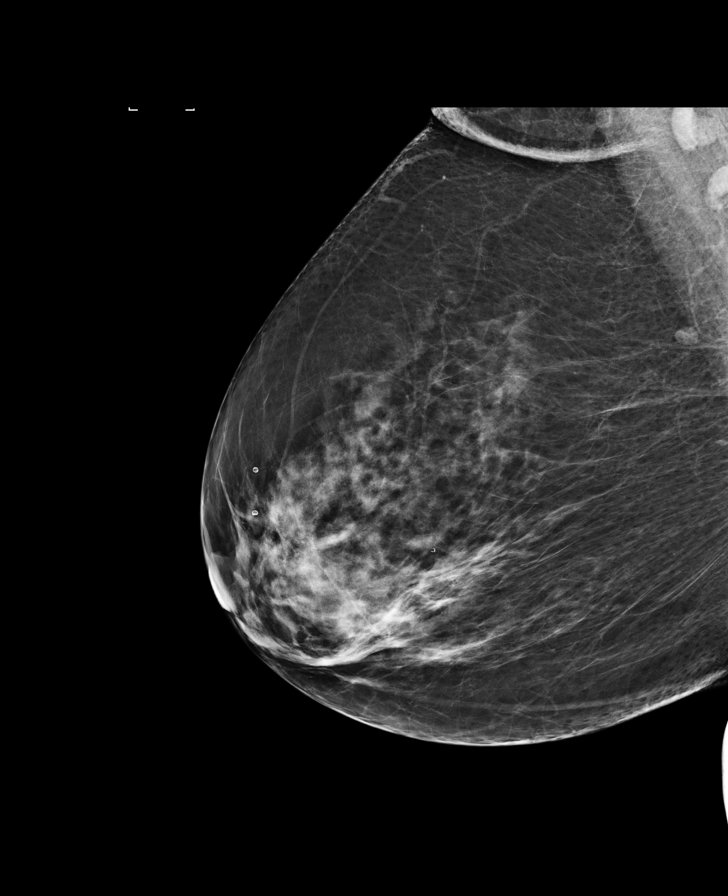

[R CC]
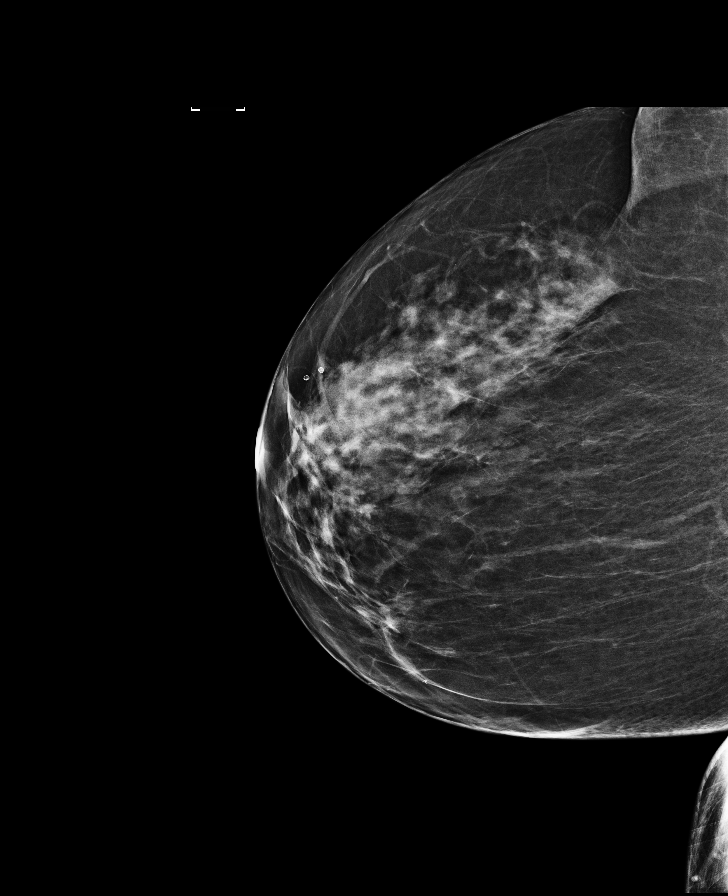

[L MLO (2 of 2)]
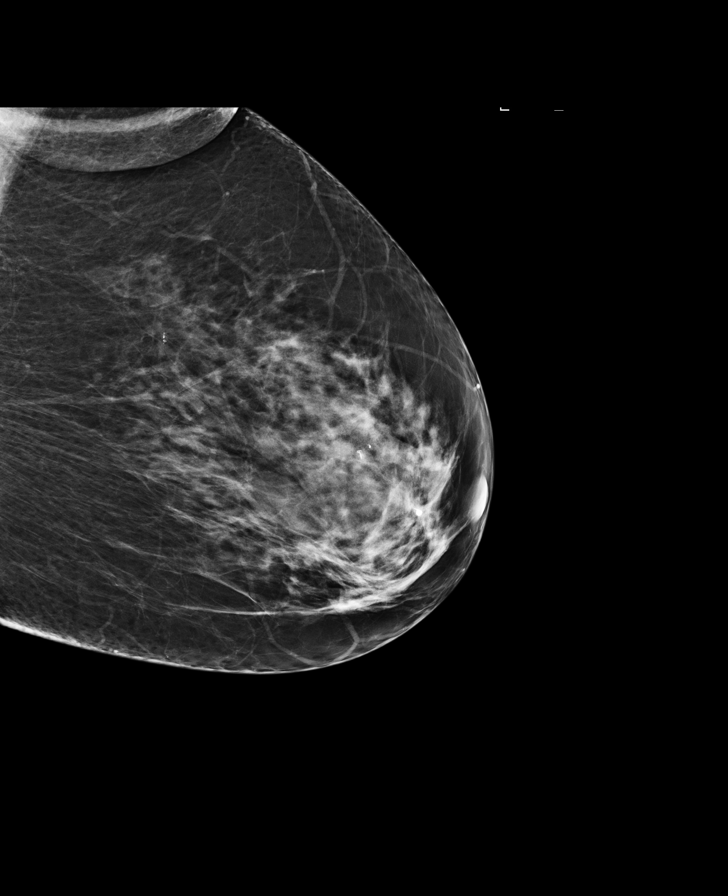

[R CC tomo · tomo slice 41/82.0]
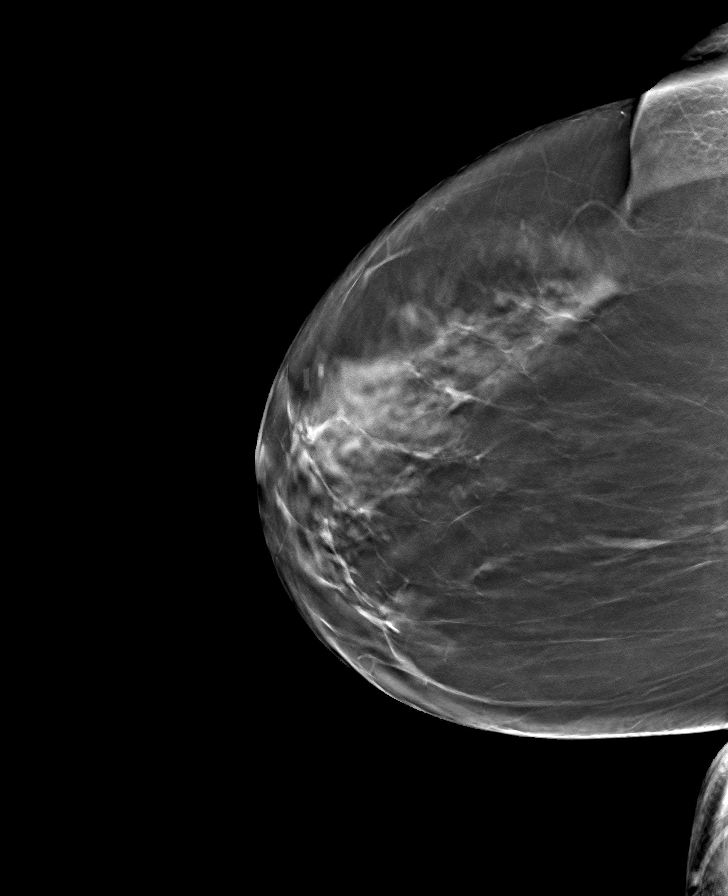

[8 of 27 positions shown; findings below may reference images not displayed]

ACR Breast Density Category c: The breast tissue is heterogeneously
dense, which may obscure small masses.
FINDINGS: There are no findings suspicious for malignancy. Images were
processed with CAD.
IMPRESSION: No mammographic evidence of malignancy. A result letter of this
screening mammogram will be mailed directly to the patient.

RECOMMENDATION:
Screening mammogram in one year. (Code:[id number])

BI-RADS CATEGORY  1: Negative.

## 2018-12-22 ENCOUNTER — Other Ambulatory Visit: Payer: Self-pay | Admitting: Family Medicine

## 2018-12-25 ENCOUNTER — Encounter: Payer: Self-pay | Admitting: *Deleted

## 2019-01-23 ENCOUNTER — Other Ambulatory Visit: Payer: Self-pay | Admitting: Family Medicine

## 2019-02-05 ENCOUNTER — Ambulatory Visit (HOSPITAL_COMMUNITY)
Admission: RE | Admit: 2019-02-05 | Discharge: 2019-02-05 | Disposition: A | Discharge status: Home / Self Care | Payer: Medicare Other | Source: Ambulatory Visit | Attending: Family Medicine | Admitting: Family Medicine

## 2019-02-05 ENCOUNTER — Ambulatory Visit (INDEPENDENT_AMBULATORY_CARE_PROVIDER_SITE_OTHER): Payer: Self-pay | Admitting: Family Medicine

## 2019-02-05 ENCOUNTER — Other Ambulatory Visit: Payer: Self-pay

## 2019-02-05 DIAGNOSIS — I10 Essential (primary) hypertension: Secondary | ICD-10-CM

## 2019-02-05 DIAGNOSIS — M47812 Spondylosis without myelopathy or radiculopathy, cervical region: Secondary | ICD-10-CM | POA: Diagnosis not present

## 2019-02-05 DIAGNOSIS — M542 Cervicalgia: Secondary | ICD-10-CM | POA: Insufficient documentation

## 2019-02-05 NOTE — Progress Notes (Signed)
No visit, pt did not answer call despite multiple attempts

## 2019-02-10 ENCOUNTER — Encounter: Payer: Self-pay | Admitting: Family Medicine

## 2019-02-10 ENCOUNTER — Telehealth: Payer: Self-pay | Admitting: Family Medicine

## 2019-02-10 ENCOUNTER — Other Ambulatory Visit: Payer: Self-pay

## 2019-02-10 ENCOUNTER — Ambulatory Visit (INDEPENDENT_AMBULATORY_CARE_PROVIDER_SITE_OTHER): Payer: Medicare Other | Admitting: Family Medicine

## 2019-02-10 VITALS — BP 114/74 | Ht 60.5 in | Wt 166.0 lb

## 2019-02-10 DIAGNOSIS — I10 Essential (primary) hypertension: Secondary | ICD-10-CM

## 2019-02-10 DIAGNOSIS — F29 Unspecified psychosis not due to a substance or known physiological condition: Secondary | ICD-10-CM

## 2019-02-10 DIAGNOSIS — M542 Cervicalgia: Secondary | ICD-10-CM

## 2019-02-10 DIAGNOSIS — E669 Obesity, unspecified: Secondary | ICD-10-CM

## 2019-02-10 DIAGNOSIS — Z1231 Encounter for screening mammogram for malignant neoplasm of breast: Secondary | ICD-10-CM | POA: Diagnosis not present

## 2019-02-10 DIAGNOSIS — E785 Hyperlipidemia, unspecified: Secondary | ICD-10-CM

## 2019-02-10 DIAGNOSIS — L309 Dermatitis, unspecified: Secondary | ICD-10-CM

## 2019-02-10 DIAGNOSIS — E559 Vitamin D deficiency, unspecified: Secondary | ICD-10-CM

## 2019-02-10 MED ORDER — ROSUVASTATIN CALCIUM 10 MG PO TABS: 10.0000 mg | ORAL_TABLET | Freq: Every day | ORAL | 1 refills | Status: DC

## 2019-02-10 MED ORDER — HYDROXYZINE HCL 50 MG PO TABS: ORAL_TABLET | ORAL | 1 refills | Status: DC

## 2019-02-10 NOTE — Telephone Encounter (Signed)
Pt LVm that she wants to know what the results are on the neck xray

## 2019-02-10 NOTE — Telephone Encounter (Signed)
discssed at visit today

## 2019-02-10 NOTE — Progress Notes (Signed)
Virtual Visit via Telephone Note  I connected with Julia Stevens on 02/10/19 at  1:20 PM EDT by telephone and verified that I am speaking with the correct person using two identifiers.  Location: Patient: home Provider: office   I discussed the limitations, risks, security and privacy concerns of performing an evaluation and management service by telephone and the availability of in person appointments. I also discussed with the patient that there may be a patient responsible charge related to this service. The patient expressed understanding and agreed to proceed. I connected with  Julia Stevens on 02/10/19 by a video enabled telemedicine application and verified that I am speaking with the correct person using two identifiers.   I discussed the limitations of evaluation and management by telemedicine. The patient expressed understanding and agreed to proceed.     History of Present Illness: F/U chronic problems , review recent x ray of neck Denies recent fever or chills. Denies sinus pressure, nasal congestion, ear pain or sore throat. Denies chest congestion, productive cough or wheezing. Denies chest pains, palpitations and leg swelling Denies abdominal pain, nausea, vomiting,diarrhea or constipation.   Denies dysuria, frequency, hesitancy or incontinence. C/o hand and forearm pain and numbness, responds to ibuprofen. Denies headaches, seizures, numbness, or tingling. Denies depression, uncontrolled anxiety or insomnia. Denies skin break down or rash.       Observations/Objective: BP 114/74   Ht 5' 0.5" (1.537 m)   Wt 166 lb (75.3 kg)   BMI 31.89 kg/m  Good communication with no confusion and intact memory. Alert and oriented x 3 No signs of respiratory distress during speech    Assessment and Plan: HTN, goal below 140/80 Controlled, no change in medication DASH diet and commitment to daily physical activity for a minimum of 30 minutes discussed and encouraged,  as a part of hypertension management. The importance of attaining a healthy weight is also discussed.  BP/Weight 02/10/2019 11/03/2018 07/28/2018 05/22/2018 01/16/2018 09/04/2017 80/99/8338  Systolic BP 250 [phone] 409  Diastolic BP 74 74 90 80 80 90 90  Wt. (Lbs) 166 171 164 165.12 161 - 166  BMI 31.89 32.85 32.03 31.72 30.42 - 31.37       Hyperlipidemia LDL goal <100 Hyperlipidemia:Low fat diet discussed and encouraged.   Lipid Panel  Lab Results  Component Value Date   CHOL 223 (H) 06/02/2018   HDL 45 (L) 06/02/2018   LDLCALC 151 (H) 06/02/2018   TRIG 143 06/02/2018   CHOLHDL 5.0 (H) 06/02/2018   Uncontrolled;led Updated lab needed at/ before next visit.     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 The patient was provided an opportunity to ask questions and all were answered. The patient agreed with the plan and demonstrated an understanding of the instructions.   The patient was advised to call back or seek an in-person evaluation if the symptoms worsen or if the condition fails to improve as anticipated.  I provided 22 minutes of non-face-to-face time during this encounter.   Tula Nakayama, MD

## 2019-02-10 NOTE — Patient Instructions (Signed)
Please keep wellness as before  Mammogram for December scheduled and appointment is mailed  Check your pharmacy and get if possible the shingrix vaccines  ( 2 total)  MD follow up in January  Please get fasting lipid, cmp and eGFr and TSH and vit D in the next  week ( [facility])  No medication changes at this time

## 2019-02-15 ENCOUNTER — Encounter: Payer: Self-pay | Admitting: Family Medicine

## 2019-02-15 NOTE — Assessment & Plan Note (Signed)
Chronic and unchanged , due to arthritis in neck

## 2019-02-15 NOTE — Assessment & Plan Note (Signed)
Controlled, no change in medication  

## 2019-02-15 NOTE — Assessment & Plan Note (Signed)
Controlled with intermittent topical steroid

## 2019-02-15 NOTE — Assessment & Plan Note (Signed)
Hyperlipidemia:Low fat diet discussed and encouraged.   Lipid Panel  Lab Results  Component Value Date   CHOL 223 (H) 06/02/2018   HDL 45 (L) 06/02/2018   LDLCALC 151 (H) 06/02/2018   TRIG 143 06/02/2018   CHOLHDL 5.0 (H) 06/02/2018   Uncontrolled;led Updated lab needed at/ before next visit.

## 2019-02-15 NOTE — Assessment & Plan Note (Signed)
Controlled, no change in medication DASH diet and commitment to daily physical activity for a minimum of 30 minutes discussed and encouraged, as a part of hypertension management. The importance of attaining a healthy weight is also discussed.  BP/Weight 02/10/2019 11/03/2018 07/28/2018 05/22/2018 01/16/2018 09/04/2017 76/28/3151  Systolic BP 761 607 371 [phone]  Diastolic BP 74 74 90 80 80 90 90  Wt. (Lbs) 166 171 164 165.12 161 - 166  BMI 31.89 32.85 32.03 31.72 30.42 - 31.37

## 2019-02-17 ENCOUNTER — Other Ambulatory Visit: Payer: Self-pay

## 2019-02-17 DIAGNOSIS — J449 Chronic obstructive pulmonary disease, unspecified: Secondary | ICD-10-CM

## 2019-02-19 DIAGNOSIS — E785 Hyperlipidemia, unspecified: Secondary | ICD-10-CM | POA: Diagnosis not present

## 2019-02-19 DIAGNOSIS — E669 Obesity, unspecified: Secondary | ICD-10-CM | POA: Diagnosis not present

## 2019-02-19 DIAGNOSIS — I10 Essential (primary) hypertension: Secondary | ICD-10-CM | POA: Diagnosis not present

## 2019-02-19 DIAGNOSIS — E559 Vitamin D deficiency, unspecified: Secondary | ICD-10-CM | POA: Diagnosis not present

## 2019-02-20 LAB — TSH: TSH: 3.08 mIU/L (ref 0.40–4.50)

## 2019-02-20 LAB — LIPID PANEL
Cholesterol: 111 mg/dL (ref <200)
HDL: 39 mg/dL — ABNORMAL LOW (ref >=50)
LDL Cholesterol (Calc): 54 mg/dL (calc)
Non-HDL Cholesterol (Calc): 72 mg/dL (calc) (ref <130)
Total CHOL/HDL Ratio: 2.8 (calc) (ref <5.0)
Triglycerides: 95 mg/dL (ref <150)

## 2019-02-20 LAB — COMPLETE METABOLIC PANEL WITH GFR
AG Ratio: 1.2 (calc) (ref 1.0–2.5)
ALT: 17 U/L (ref 6–29)
AST: 32 U/L (ref 10–35)
Albumin: 4.3 g/dL (ref 3.6–5.1)
Alkaline phosphatase (APISO): 62 U/L (ref 37–153)
BUN/Creatinine Ratio: 18 (calc) (ref 6–22)
BUN: 19 mg/dL (ref 7–25)
CO2: 27 mmol/L (ref 20–32)
Calcium: 9.4 mg/dL (ref 8.6–10.4)
Chloride: 103 mmol/L (ref 98–110)
Creat: 1.03 mg/dL — ABNORMAL HIGH (ref 0.60–0.93)
GFR, Est African American: 64 mL/min/{1.73_m2} (ref >=60)
GFR, Est Non African American: 55 mL/min/{1.73_m2} — ABNORMAL LOW (ref >=60)
Globulin: 3.5 g/dL (calc) (ref 1.9–3.7)
Glucose, Bld: 77 mg/dL (ref 65–99)
Potassium: 3.5 mmol/L (ref 3.5–5.3)
Sodium: 139 mmol/L (ref 135–146)
Total Bilirubin: 0.5 mg/dL (ref 0.2–1.2)
Total Protein: 7.8 g/dL (ref 6.1–8.1)

## 2019-02-20 LAB — VITAMIN D 25 HYDROXY (VIT D DEFICIENCY, FRACTURES): Vit D, 25-Hydroxy: 48 ng/mL (ref 30–100)

## 2019-02-23 ENCOUNTER — Encounter: Payer: Self-pay | Admitting: Family Medicine

## 2019-02-24 ENCOUNTER — Other Ambulatory Visit: Payer: Self-pay | Admitting: Family Medicine

## 2019-02-25 ENCOUNTER — Telehealth: Payer: Self-pay | Admitting: *Deleted

## 2019-02-25 NOTE — Telephone Encounter (Signed)
Pt is calling requesting the results of her labs

## 2019-02-26 NOTE — Telephone Encounter (Signed)
See comment recorded, I believe a letter was mailed

## 2019-02-26 NOTE — Telephone Encounter (Signed)
Advised patient of recent lab results with verbal understanding.   

## 2019-03-27 ENCOUNTER — Other Ambulatory Visit: Payer: Self-pay | Admitting: Family Medicine

## 2019-04-03 ENCOUNTER — Other Ambulatory Visit: Payer: Self-pay | Admitting: Family Medicine

## 2019-04-08 ENCOUNTER — Other Ambulatory Visit: Payer: Self-pay | Admitting: Family Medicine

## 2019-04-09 ENCOUNTER — Other Ambulatory Visit: Payer: Self-pay

## 2019-04-09 ENCOUNTER — Telehealth: Payer: Self-pay | Admitting: *Deleted

## 2019-04-09 MED ORDER — NORTRIPTYLINE HCL 50 MG PO CAPS: 100.0000 mg | ORAL_CAPSULE | Freq: Every day | ORAL | 5 refills | Status: DC

## 2019-04-09 NOTE — Telephone Encounter (Signed)
Pt called needs a refill on nortipt[facility] called to [place] and would like for them to deliver this they cannot hear her when she calls

## 2019-04-09 NOTE — Telephone Encounter (Signed)
Refill sent and told to deliver

## 2019-04-29 ENCOUNTER — Other Ambulatory Visit: Payer: Self-pay | Admitting: Family Medicine

## 2019-05-04 ENCOUNTER — Other Ambulatory Visit: Payer: Self-pay

## 2019-05-04 ENCOUNTER — Ambulatory Visit: Payer: Medicare Other

## 2019-05-31 ENCOUNTER — Other Ambulatory Visit: Payer: Self-pay | Admitting: Family Medicine

## 2019-06-10 ENCOUNTER — Ambulatory Visit: Payer: Medicare Other

## 2019-06-15 ENCOUNTER — Ambulatory Visit: Payer: Medicare Other

## 2019-06-16 ENCOUNTER — Ambulatory Visit: Payer: Medicare Other

## 2019-06-25 ENCOUNTER — Ambulatory Visit: Payer: Medicare Other

## 2019-07-02 ENCOUNTER — Other Ambulatory Visit: Payer: Self-pay | Admitting: Family Medicine

## 2019-07-10 ENCOUNTER — Other Ambulatory Visit: Payer: Self-pay | Admitting: Family Medicine

## 2019-07-13 ENCOUNTER — Ambulatory Visit (INDEPENDENT_AMBULATORY_CARE_PROVIDER_SITE_OTHER): Payer: Medicare Other

## 2019-07-13 ENCOUNTER — Other Ambulatory Visit: Payer: Self-pay

## 2019-07-13 DIAGNOSIS — Z23 Encounter for immunization: Secondary | ICD-10-CM

## 2019-07-30 ENCOUNTER — Encounter: Payer: Self-pay | Admitting: Family Medicine

## 2019-07-30 ENCOUNTER — Ambulatory Visit (INDEPENDENT_AMBULATORY_CARE_PROVIDER_SITE_OTHER): Payer: Medicare Other | Admitting: Family Medicine

## 2019-07-30 ENCOUNTER — Other Ambulatory Visit: Payer: Self-pay

## 2019-07-30 VITALS — BP 114/74 | HR 86 | Resp 15 | Ht 60.5 in | Wt 166.0 lb

## 2019-07-30 DIAGNOSIS — Z0000 Encounter for general adult medical examination without abnormal findings: Secondary | ICD-10-CM

## 2019-07-30 NOTE — Patient Instructions (Signed)
Julia Stevens , Thank you for taking time to come for your Medicare Wellness Visit. I appreciate your ongoing commitment to your health goals. Please review the following plan we discussed and let me know if I can assist you in the future.   Please continue to practice social distancing to keep you, your family, and our community safe.  If you must go out, please wear a Mask and practice good handwashing.  We hope you have a happy, safe, and healthy Holiday Season! See you in the New Year :)  Screening recommendations/referrals: Colonoscopy: Due 2025 Mammogram: Up to date Bone Density: Up to date  Recommended yearly ophthalmology/optometry visit for glaucoma screening and checkup Recommended yearly dental visit for hygiene and checkup  Vaccinations: Influenza vaccine: Completed Pneumococcal vaccine: Completed Tdap vaccine: Up to date Shingles vaccine: Completed  Advanced directives: Declined   Conditions/risks identified: Fall  Next appointment: 08/24/2019    Preventive Care 24 Years and Older, Female Preventive care refers to lifestyle choices and visits with your health care provider that can promote health and wellness. What does preventive care include?  A yearly physical exam. This is also called an annual well check.  Dental exams once or twice a year.  Routine eye exams. Ask your health care provider how often you should have your eyes checked.  Personal lifestyle choices, including:  Daily care of your teeth and gums.  Regular physical activity.  Eating a healthy diet.  Avoiding tobacco and drug use.  Limiting alcohol use.  Practicing safe sex.  Taking low-dose aspirin every day.  Taking vitamin and mineral supplements as recommended by your health care provider. What happens during an annual well check? The services and screenings done by your health care provider during your annual well check will depend on your age, overall health, lifestyle risk  factors, and family history of disease. Counseling  Your health care provider may ask you questions about your:  Alcohol use.  Tobacco use.  Drug use.  Emotional well-being.  Home and relationship well-being.  Sexual activity.  Eating habits.  History of falls.  Memory and ability to understand (cognition).  Work and work Statistician.  Reproductive health. Screening  You may have the following tests or measurements:  Height, weight, and BMI.  Blood pressure.  Lipid and cholesterol levels. These may be checked every 5 years, or more frequently if you are over 41 years old.  Skin check.  Lung cancer screening. You may have this screening every year starting at age 2 if you have a 30-pack-year history of smoking and currently smoke or have quit within the past 15 years.  Fecal occult blood test (FOBT) of the stool. You may have this test every year starting at age 72.  Flexible sigmoidoscopy or colonoscopy. You may have a sigmoidoscopy every 5 years or a colonoscopy every 10 years starting at age 62.  Hepatitis C blood test.  Hepatitis B blood test.  Sexually transmitted disease (STD) testing.  Diabetes screening. This is done by checking your blood sugar (glucose) after you have not eaten for a while (fasting). You may have this done every 1-3 years.  Bone density scan. This is done to screen for osteoporosis. You may have this done starting at age 30.  Mammogram. This may be done every 1-2 years. Talk to your health care provider about how often you should have regular mammograms. Talk with your health care provider about your test results, treatment options, and if necessary, the need for  more tests. Vaccines  Your health care provider may recommend certain vaccines, such as:  Influenza vaccine. This is recommended every year.  Tetanus, diphtheria, and acellular pertussis (Tdap, Td) vaccine. You may need a Td booster every 10 years.  Zoster vaccine. You  may need this after age 36.  Pneumococcal 13-valent conjugate (PCV13) vaccine. One dose is recommended after age 16.  Pneumococcal polysaccharide (PPSV23) vaccine. One dose is recommended after age 64. Talk to your health care provider about which screenings and vaccines you need and how often you need them. This information is not intended to replace advice given to you by your health care provider. Make sure you discuss any questions you have with your health care provider. Document Released: 09/02/2015 Document Revised: 04/25/2016 Document Reviewed: 06/07/2015 Elsevier Interactive Patient Education  2017 [place] Prevention in the Home Falls can cause injuries. They can happen to people of all ages. There are many things you can do to make your home safe and to help prevent falls. What can I do on the outside of my home?  Regularly fix the edges of walkways and driveways and fix any cracks.  Remove anything that might make you trip as you walk through a door, such as a raised step or threshold.  Trim any bushes or trees on the path to your home.  Use bright outdoor lighting.  Clear any walking paths of anything that might make someone trip, such as rocks or tools.  Regularly check to see if handrails are loose or broken. Make sure that both sides of any steps have handrails.  Any raised decks and porches should have guardrails on the edges.  Have any leaves, snow, or ice cleared regularly.  Use sand or salt on walking paths during winter.  Clean up any spills in your garage right away. This includes oil or grease spills. What can I do in the bathroom?  Use night lights.  Install grab bars by the toilet and in the tub and shower. Do not use towel bars as grab bars.  Use non-skid mats or decals in the tub or shower.  If you need to sit down in the shower, use a plastic, non-slip stool.  Keep the floor dry. Clean up any water that spills on the floor as soon as  it happens.  Remove soap buildup in the tub or shower regularly.  Attach bath mats securely with double-sided non-slip rug tape.  Do not have throw rugs and other things on the floor that can make you trip. What can I do in the bedroom?  Use night lights.  Make sure that you have a light by your bed that is easy to reach.  Do not use any sheets or blankets that are too big for your bed. They should not hang down onto the floor.  Have a firm chair that has side arms. You can use this for support while you get dressed.  Do not have throw rugs and other things on the floor that can make you trip. What can I do in the kitchen?  Clean up any spills right away.  Avoid walking on wet floors.  Keep items that you use a lot in easy-to-reach places.  If you need to reach something above you, use a strong step stool that has a grab bar.  Keep electrical cords out of the way.  Do not use floor polish or wax that makes floors slippery. If you must use wax, use non-skid  floor wax.  Do not have throw rugs and other things on the floor that can make you trip. What can I do with my stairs?  Do not leave any items on the stairs.  Make sure that there are handrails on both sides of the stairs and use them. Fix handrails that are broken or loose. Make sure that handrails are as long as the stairways.  Check any carpeting to make sure that it is firmly attached to the stairs. Fix any carpet that is loose or worn.  Avoid having throw rugs at the top or bottom of the stairs. If you do have throw rugs, attach them to the floor with carpet tape.  Make sure that you have a light switch at the top of the stairs and the bottom of the stairs. If you do not have them, ask someone to add them for you. What else can I do to help prevent falls?  Wear shoes that:  Do not have high heels.  Have rubber bottoms.  Are comfortable and fit you well.  Are closed at the toe. Do not wear sandals.  If  you use a stepladder:  Make sure that it is fully opened. Do not climb a closed stepladder.  Make sure that both sides of the stepladder are locked into place.  Ask someone to hold it for you, if possible.  Clearly mark and make sure that you can see:  Any grab bars or handrails.  First and last steps.  Where the edge of each step is.  Use tools that help you move around (mobility aids) if they are needed. These include:  Canes.  Walkers.  Scooters.  Crutches.  Turn on the lights when you go into a dark area. Replace any light bulbs as soon as they burn out.  Set up your furniture so you have a clear path. Avoid moving your furniture around.  If any of your floors are uneven, fix them.  If there are any pets around you, be aware of where they are.  Review your medicines with your doctor. Some medicines can make you feel dizzy. This can increase your chance of falling. Ask your doctor what other things that you can do to help prevent falls. This information is not intended to replace advice given to you by your health care provider. Make sure you discuss any questions you have with your health care provider. Document Released: 06/02/2009 Document Revised: 01/12/2016 Document Reviewed: 09/10/2014 Elsevier Interactive Patient Education  2017 [employer].

## 2019-07-30 NOTE — Progress Notes (Signed)
Subjective:   Julia Stevens is a 73 y.o. female who presents for Medicare Annual (Subsequent) preventive examination.  Location of Patient: Home Location of Provider: Telehealth Consent was obtain for visit to be over via telehealth.  I verified that I am speaking with the correct person using two identifiers.  Review of Systems:    Cardiac Risk Factors include: advanced age (>19men, >70 women);dyslipidemia;hypertension;obesity (BMI >30kg/m2)     Objective:     Vitals: BP 114/74    Pulse 86    Resp 15    Ht 5' 0.5" (1.537 m)    Wt 166 lb (75.3 kg)    BMI 31.89 kg/m   Body mass index is 31.89 kg/m.  Advanced Directives 07/28/2018 07/22/2017 02/15/2017 12/25/2013  Does Patient Have a Medical Advance Directive? No No No Patient does not have advance directive;Patient would not like information  Would patient like information on creating a medical advance directive? No - Patient declined No - Patient declined No - Patient declined -  Pre-existing out of facility DNR order (yellow form or pink MOST form) - - - No    Tobacco Social History   Tobacco Use  Smoking Status Never Smoker  Smokeless Tobacco Never Used     Counseling given: Yes   Clinical Intake:  Pre-visit preparation completed: Yes  Pain : No/denies pain Pain Score: 0-No pain     BMI - recorded: 31.89 Nutritional Status: BMI > 30  Obese Nutritional Risks: None Diabetes: No  How often do you need to have someone help you when you read instructions, pamphlets, or other written materials from your doctor or pharmacy?: 1 - Never What is the last grade level you completed in school?: 12  Interpreter Needed?: No     Past Medical History:  Diagnosis Date   Arthritis    Dysphagia    Eczema    Hyperlipidemia    Hypertension    Psychosis ([place])    Scleroderma ([place])    Shingles    Past Surgical History:  Procedure Laterality Date   COLONOSCOPY  06/26/2000   [name],    COLONOSCOPY N/A 12/25/2013    Procedure: COLONOSCOPY;  Surgeon: Danie Binder, MD;  Location[facility];  Service: Endoscopy;  Laterality: N/A;  10:45AM   TOTAL ABDOMINAL HYSTERECTOMY W/ BILATERAL SALPINGOOPHORECTOMY  1994   Family History  Problem Relation Age of Onset   Hypertension Mother    Heart disease Mother        CAD   Hypertension Father         Severe DJD/ macrobacterium   Hypertension Sister    Hypertension Sister    Fibromyalgia Sister    Diabetes Brother    Colon cancer Neg Hx    Social History   Socioeconomic History   Marital status: Single    Spouse name: Not on file   Number of children: 0   Years of education: 12   Highest education level: 12th grade  Occupational History   Occupation: disabled     Fish farm manager: UNEMPLOYED  Tobacco Use   Smoking status: Never Smoker   Smokeless tobacco: Never Used  Substance and Sexual Activity   Alcohol use: No   Drug use: No   Sexual activity: Not Currently    Birth control/protection: Surgical  Other Topics Concern   Not on file  Social History Narrative   Lives alone in apartment    Social Determinants of Health   Financial Resource Strain: Low Risk  Difficulty of Paying Living Expenses: Not very hard  Food Insecurity: No Food Insecurity   Worried About Running Out of Food in the Last Year: Never true   Ran Out of Food in the Last Year: Never true  Transportation Needs: Unmet Transportation Needs   Lack of Transportation (Medical): Yes   Lack of Transportation (Non-Medical): Yes  Physical Activity: Insufficiently Active   Days of Exercise per Week: 2 days   Minutes of Exercise per Session: 30 min  Stress: Stress Concern Present   Feeling of Stress : To some extent  Social Connections: Somewhat Isolated   Frequency of Communication with Friends and Family: More than three times a week   Frequency of Social Gatherings with Friends and Family: Once a week   Attends Religious Services: 1 to 4 times  per year   Active Member of [employer] or Organizations: No   Attends Archivist Meetings: Never   Marital Status: Never married    Outpatient Encounter Medications as of 07/30/2019  Medication Sig   aspirin EC 81 MG tablet Take 81 mg by mouth daily.   calcium carbonate (OS-CAL) 600 MG TABS tablet Take 600 mg by mouth 2 (two) times daily with a meal.   cetirizine (ZYRTEC) 10 MG tablet Take 1 tablet (10 mg total) by mouth daily.   diltiazem (CARDIZEM CD) 300 MG 24 hr capsule TAKE 1 CAPSULE BY MOUTH DAILY.   hydrochlorothiazide (HYDRODIURIL) 25 MG tablet TAKE ONE TABLET BY MOUTH DAILY.   hydrOXYzine (ATARAX/VISTARIL) 50 MG tablet 1 tab at bedtime   ibuprofen (ADVIL,MOTRIN) 600 MG tablet Take  One tablet two times daily for 1 week   Multiple Vitamin (MULTIVITAMIN) capsule Take 1 capsule by mouth daily.     mupirocin ointment (BACTROBAN) 2 % Place 1 application into the nose 2 (two) times daily.   nortriptyline (PAMELOR) 50 MG capsule Take 2 capsules (100 mg total) by mouth at bedtime.   potassium chloride (K-DUR) 10 MEQ tablet TAKE ONE TABLET BY MOUTH TWICE DAILY.   rosuvastatin (CRESTOR) 10 MG tablet Take 1 tablet (10 mg total) by mouth daily.   [DISCONTINUED] famotidine (PEPCID) 40 MG tablet Take 1 tablet (40 mg total) by mouth daily for 14 days.   No facility-administered encounter medications on file as of 07/30/2019.    Activities of Daily Living In your present state of health, do you have any difficulty performing the following activities: 07/30/2019  Hearing? N  Vision? N  Comment wears reading glasses  Difficulty concentrating or making decisions? N  Walking or climbing stairs? N  Dressing or bathing? N  Doing errands, shopping? N  Preparing Food and eating ? N  Using the Toilet? N  In the past six months, have you accidently leaked urine? N  Do you have problems with loss of bowel control? N  Managing your Medications? N  Managing your Finances? N   Housekeeping or managing your Housekeeping? N  Some recent data might be hidden    Patient Care Team: Fayrene Helper, MD as PCP - [facility], Marga Melnick, MD as Consulting Physician (Gastroenterology)    Assessment:   This is a routine wellness examination for Alpha.  Exercise Activities and Dietary recommendations Current Exercise Habits: Home exercise routine, Type of exercise: walking, Time (Minutes): 45, Frequency (Times/Week): 7, Weekly Exercise (Minutes/Week): 315, Intensity: Mild, Exercise limited by: None identified  Goals     DIET - DECREASE SODA OR JUICE INTAKE     DIET - INCREASE  WATER INTAKE       Fall Risk Fall Risk  07/30/2019 02/10/2019 11/03/2018 07/28/2018 05/22/2018  Falls in the past year? 1 0 1 1 Yes  Number falls in past yr: 1 0 1 0 1  Injury with Fall? 1 0 0 0 No  Risk for fall due to : History of fall(s);Impaired balance/gait - History of fall(s);Impaired balance/gait Impaired vision;Impaired balance/gait -  Follow up Falls evaluation completed;Education provided;Falls prevention discussed - - Falls prevention discussed -   Is the patient's home free of loose throw rugs in walkways, pet beds, electrical cords, etc?   yes      Grab bars in the bathroom? yes      Handrails on the stairs?   yes      Adequate lighting?   yes     Depression Screen PHQ 2/9 Scores 07/30/2019 02/10/2019 11/03/2018 07/28/2018  PHQ - 2 Score 0 0 3 0  PHQ- 9 Score - - 18 -     Cognitive Function     6CIT Screen 07/30/2019 07/28/2018 07/22/2017  What Year? 0 points 0 points 0 points  What month? 0 points 0 points 0 points  What time? 0 points 0 points 0 points  Count back from 20 0 points 0 points 0 points  Months in reverse 4 points 4 points 4 points  Repeat phrase 6 points 0 points 2 points  Total Score 10 4 6     Immunization History  Administered Date(s) Administered   19-influenza Whole 07/23/2011   Fluad Quad(high Dose 65+) 07/13/2019   Influenza Split  07/28/2012, 06/22/2014   Influenza Whole 07/04/2007, 06/29/2009, 07/10/2010   Influenza,inj,Quad PF,6+ Mos 05/20/2013, 07/05/2015, 07/20/2016, 07/22/2017, 05/22/2018   Pneumococcal Conjugate-13 03/01/2014   Pneumococcal Polysaccharide-23 08/21/2013   Td 01/04/2004   Tdap 07/23/2011   Zoster 09/22/2013    Qualifies for Shingles Vaccine?  completed   Screening Tests Health Maintenance  Topic Date Due   MAMMOGRAM  07/28/2020   TETANUS/TDAP  07/22/2021   COLONOSCOPY  12/26/2023   INFLUENZA VACCINE  Completed   DEXA SCAN  Completed   Hepatitis C Screening  Completed   PNA vac Low Risk Adult  Completed    Cancer Screenings: Lung: Low Dose CT Chest recommended if Age 47-80 years, 30 pack-year currently smoking OR have quit w/in 15years. Patient does not qualify. Breast:  Up to date on Mammogram? Yes   Up to date of Bone Density/Dexa? Yes Colorectal:  Due 2025  Additional Screenings:   Hepatitis C Screening: completed     Plan:       1. Encounter for Medicare annual wellness exam  I have personally reviewed and noted the following in the patients chart:    Medical and social history  Use of alcohol, tobacco or illicit drugs   Current medications and supplements  Functional ability and status  Nutritional status  Physical activity  Advanced directives  List of other physicians  Hospitalizations, surgeries, and ER visits in previous 12 months  Vitals  Screenings to include cognitive, depression, and falls  Referrals and appointments  In addition, I have reviewed and discussed with patient certain preventive protocols, quality metrics, and best practice recommendations. A written personalized care plan for preventive services as well as general preventive health recommendations were provided to patient.   I provided 20 minutes of non-face-to-face time during this encounter.   Perlie Mayo, NP  07/30/2019

## 2019-07-31 ENCOUNTER — Ambulatory Visit: Payer: Self-pay

## 2019-08-03 ENCOUNTER — Ambulatory Visit (HOSPITAL_COMMUNITY): Payer: Medicare Other

## 2019-08-10 ENCOUNTER — Ambulatory Visit (HOSPITAL_COMMUNITY)
Admission: RE | Admit: 2019-08-10 | Discharge: 2019-08-10 | Disposition: A | Discharge status: Home / Self Care | Payer: Medicare Other | Source: Ambulatory Visit | Attending: Family Medicine | Admitting: Family Medicine

## 2019-08-10 ENCOUNTER — Other Ambulatory Visit: Payer: Self-pay

## 2019-08-10 DIAGNOSIS — Z1231 Encounter for screening mammogram for malignant neoplasm of breast: Secondary | ICD-10-CM | POA: Diagnosis not present

## 2019-08-24 ENCOUNTER — Ambulatory Visit: Payer: Medicare Other | Admitting: Family Medicine

## 2019-08-26 ENCOUNTER — Ambulatory Visit (INDEPENDENT_AMBULATORY_CARE_PROVIDER_SITE_OTHER): Payer: Medicare Other | Admitting: Family Medicine

## 2019-08-26 ENCOUNTER — Encounter: Payer: Self-pay | Admitting: Family Medicine

## 2019-08-26 ENCOUNTER — Other Ambulatory Visit: Payer: Self-pay

## 2019-08-26 VITALS — BP 120/78 | HR 88 | Temp 97.8°F | Resp 15 | Ht 60.5 in | Wt 173.0 lb

## 2019-08-26 DIAGNOSIS — M069 Rheumatoid arthritis, unspecified: Secondary | ICD-10-CM | POA: Diagnosis not present

## 2019-08-26 DIAGNOSIS — G8929 Other chronic pain: Secondary | ICD-10-CM

## 2019-08-26 DIAGNOSIS — F29 Unspecified psychosis not due to a substance or known physiological condition: Secondary | ICD-10-CM

## 2019-08-26 DIAGNOSIS — I10 Essential (primary) hypertension: Secondary | ICD-10-CM | POA: Diagnosis not present

## 2019-08-26 DIAGNOSIS — E669 Obesity, unspecified: Secondary | ICD-10-CM

## 2019-08-26 DIAGNOSIS — M25552 Pain in left hip: Secondary | ICD-10-CM | POA: Diagnosis not present

## 2019-08-26 DIAGNOSIS — E785 Hyperlipidemia, unspecified: Secondary | ICD-10-CM

## 2019-08-26 DIAGNOSIS — E66811 Obesity, class 1: Secondary | ICD-10-CM

## 2019-08-26 NOTE — Progress Notes (Signed)
   Julia Stevens     MRN: [id number]      DOB: Feb 12, 1946   HPI Julia Stevens is here for follow up and re-evaluation of chronic medical conditions, medication management and review of any available recent lab and radiology data.  Preventive health is updated, specifically  Cancer screening and Immunization.   C/o numerous falls in past year, averaging about 2 times per month and often associated with left hip pain and weakness  ROS Denies recent fever or chills. Denies sinus pressure, nasal congestion, ear pain or sore throat. Denies chest congestion, productive cough or wheezing. Denies chest pains, palpitations and leg swelling Denies abdominal pain, nausea, vomiting,diarrhea or constipation.   Denies dysuria, frequency, hesitancy or incontinence. Denies headaches, seizures, numbness, or tingling. Denies depression, anxiety or insomnia. Denies skin break down or rash.   PE  BP 120/78   Pulse 88   Temp 97.8 F (36.6 C) (Temporal)   Resp 15   Ht 5' 0.5" (1.537 m)   Wt 173 lb (78.5 kg)   SpO2 98%   BMI 33.23 kg/m   Patient alert and oriented and in no cardiopulmonary distress.  HEENT: No facial asymmetry, EOMI,     Neck supple .  Chest: Clear to auscultation bilaterally.  CVS: S1, S2 no murmurs, no S3.Regular rate.  ABD: Soft non tender.   Ext: No edema  MS: Adequate ROM spine, shoulders,  and knees.Decreased ROM lumbar spine and left hip bilateral deformity of digits of both hands Skin: Intact, hyperpigmented rash noted.  Psych: Good eye contact, normal affect. Memory intact not anxious or depressed appearing.  CNS: CN 2-12 intact, power,  normal throughout.no focal deficits noted.   Assessment & Plan  Chronic hip pain, left Increased and uncontrolled following recent fall, refer to Ortho. Refuses cane despite multiple recent falls  Rheumatoid arthritis of hand Progressively worsening but denies uncontrolled pain and is still able to use hands  Nonorganic  psychosis Stable and controlled on current meds  Obesity (BMI 30.0-34.9)  Patient re-educated about  the importance of commitment to a  minimum of 150 minutes of exercise per week as able.  The importance of healthy food choices with portion control discussed, as well as eating regularly and within a 12 hour window most days. The need to choose "clean , green" food 50 to 75% of the time is discussed, as well as to make water the primary drink and set a goal of 64 ounces water daily.    Weight /BMI 08/26/2019 07/30/2019 02/10/2019  WEIGHT 173 lb 166 lb 166 lb  HEIGHT 5' .5" 5' .5" 5' .5"  BMI 33.23 kg/m2 31.89 kg/m2 31.89 kg/m2      Hyperlipidemia LDL goal <100 Hyperlipidemia:Low fat diet discussed and encouraged.   Lipid Panel  Lab Results  Component Value Date   CHOL 129 08/26/2019   HDL 43 (L) 08/26/2019   LDLCALC 67 08/26/2019   TRIG 108 08/26/2019   CHOLHDL 3.0 08/26/2019

## 2019-08-26 NOTE — Patient Instructions (Addendum)
F/U in office wioth MD in early September, call if you need me sooner  Labs today, lipid, cmp and eGFr , CBC  You are referred to dr Aline Brochure tre left hip pain and instability with recurrent falls.  Please be careful not to fall, I do recommend a cane for stability, so if you change your mind on this , please calla dn let me know  Thanks for choosing [facility], we consider it a privelige to serve you. Best for 2021

## 2019-08-27 LAB — COMPLETE METABOLIC PANEL WITH GFR
AG Ratio: 1.1 (calc) (ref 1.0–2.5)
ALT: 15 U/L (ref 6–29)
AST: 26 U/L (ref 10–35)
Albumin: 4.1 g/dL (ref 3.6–5.1)
Alkaline phosphatase (APISO): 67 U/L (ref 37–153)
BUN/Creatinine Ratio: 15 (calc) (ref 6–22)
BUN: 19 mg/dL (ref 7–25)
CO2: 27 mmol/L (ref 20–32)
Calcium: 9.4 mg/dL (ref 8.6–10.4)
Chloride: 102 mmol/L (ref 98–110)
Creat: 1.27 mg/dL — ABNORMAL HIGH (ref 0.60–0.93)
GFR, Est African American: 49 mL/min/{1.73_m2} — ABNORMAL LOW (ref >=60)
GFR, Est Non African American: 42 mL/min/{1.73_m2} — ABNORMAL LOW (ref >=60)
Globulin: 3.7 g/dL (calc) (ref 1.9–3.7)
Glucose, Bld: 92 mg/dL (ref 65–99)
Potassium: 3.6 mmol/L (ref 3.5–5.3)
Sodium: 140 mmol/L (ref 135–146)
Total Bilirubin: 0.5 mg/dL (ref 0.2–1.2)
Total Protein: 7.8 g/dL (ref 6.1–8.1)

## 2019-08-27 LAB — CBC
HCT: 35.7 % (ref 35.0–45.0)
Hemoglobin: 12.1 g/dL (ref 11.7–15.5)
MCH: 30.3 pg (ref 27.0–33.0)
MCHC: 33.9 g/dL (ref 32.0–36.0)
MCV: 89.5 fL (ref 80.0–100.0)
MPV: 10.7 fL (ref 7.5–12.5)
Platelets: 247 10*3/uL (ref 140–400)
RBC: 3.99 10*6/uL (ref 3.80–5.10)
RDW: 12.9 % (ref 11.0–15.0)
WBC: 4.8 10*3/uL (ref 3.8–10.8)

## 2019-08-27 LAB — LIPID PANEL
Cholesterol: 129 mg/dL (ref <200)
HDL: 43 mg/dL — ABNORMAL LOW (ref >=50)
LDL Cholesterol (Calc): 67 mg/dL (calc)
Non-HDL Cholesterol (Calc): 86 mg/dL (calc) (ref <130)
Total CHOL/HDL Ratio: 3 (calc) (ref <5.0)
Triglycerides: 108 mg/dL (ref <150)

## 2019-08-28 ENCOUNTER — Encounter: Payer: Self-pay | Admitting: Family Medicine

## 2019-08-28 ENCOUNTER — Other Ambulatory Visit: Payer: Self-pay | Admitting: Family Medicine

## 2019-08-28 NOTE — Assessment & Plan Note (Signed)
Increased and uncontrolled following recent fall, refer to Ortho. Refuses cane despite multiple recent falls

## 2019-08-28 NOTE — Assessment & Plan Note (Signed)
Progressively worsening but denies uncontrolled pain and is still able to use hands

## 2019-08-28 NOTE — Assessment & Plan Note (Signed)
  Patient re-educated about  the importance of commitment to a  minimum of 150 minutes of exercise per week as able.  The importance of healthy food choices with portion control discussed, as well as eating regularly and within a 12 hour window most days. The need to choose "clean , green" food 50 to 75% of the time is discussed, as well as to make water the primary drink and set a goal of 64 ounces water daily.    Weight /BMI 08/26/2019 07/30/2019 02/10/2019  WEIGHT 173 lb 166 lb 166 lb  HEIGHT 5' .5" 5' .5" 5' .5"  BMI 33.23 kg/m2 31.89 kg/m2 31.89 kg/m2

## 2019-08-28 NOTE — Assessment & Plan Note (Signed)
Hyperlipidemia:Low fat diet discussed and encouraged.   Lipid Panel  Lab Results  Component Value Date   CHOL 129 08/26/2019   HDL 43 (L) 08/26/2019   LDLCALC 67 08/26/2019   TRIG 108 08/26/2019   CHOLHDL 3.0 08/26/2019

## 2019-08-28 NOTE — Assessment & Plan Note (Signed)
Stable and controlled on current meds 

## 2019-08-31 ENCOUNTER — Telehealth: Payer: Self-pay

## 2019-08-31 NOTE — Telephone Encounter (Signed)
Pt is calling wants to speak with Dr Moshe Cipro, not one of the nurses, she wants to talk about her Kidney function

## 2019-08-31 NOTE — Telephone Encounter (Signed)
I spoke directly with pt , advised she stop advil and use tylenol only for pain

## 2019-09-01 ENCOUNTER — Ambulatory Visit: Payer: Medicare Other | Admitting: Orthopaedic Surgery

## 2019-09-08 ENCOUNTER — Ambulatory Visit (INDEPENDENT_AMBULATORY_CARE_PROVIDER_SITE_OTHER): Payer: Medicare Other | Admitting: Orthopaedic Surgery

## 2019-09-08 ENCOUNTER — Encounter: Payer: Self-pay | Admitting: Orthopaedic Surgery

## 2019-09-08 ENCOUNTER — Ambulatory Visit: Payer: Medicare Other

## 2019-09-08 ENCOUNTER — Other Ambulatory Visit: Payer: Self-pay

## 2019-09-08 VITALS — BP 130/70 | HR 88 | Temp 97.0°F | Ht 60.5 in | Wt 172.0 lb

## 2019-09-08 DIAGNOSIS — M25552 Pain in left hip: Secondary | ICD-10-CM | POA: Diagnosis not present

## 2019-09-08 DIAGNOSIS — G8929 Other chronic pain: Secondary | ICD-10-CM

## 2019-09-08 DIAGNOSIS — M7062 Trochanteric bursitis, left hip: Secondary | ICD-10-CM

## 2019-09-08 NOTE — Progress Notes (Signed)
Subjective:    Patient ID: Julia Stevens, female    DOB: April 15, 1946, 74 y.o.   MRN: [id number]  HPI She has fallen several times over the last few months.  She does not know why she falls. She denies head injury, denies dizziness.  She has pain of the left hip area and left lateral thigh.  This most recent fall, the first of this month, has had more pain in this area.  She has no bruising, no redness, no fever, no chills, no other joint pain.  She has seen Dr. Moshe Cipro for this and I have reviewed the notes.   Review of Systems  Constitutional: Positive for activity change.  Musculoskeletal: Positive for arthralgias and gait problem.  All other systems reviewed and are negative.  For Review of Systems, all other systems reviewed and are negative.  The following is a summary of the past history medically, past history surgically, known current medicines, social history and family history.  This information is gathered electronically by the computer from prior information and documentation.  I review this each visit and have found including this information at this point in the chart is beneficial and informative.   Past Medical History:  Diagnosis Date  . Arthritis   . Dysphagia   . Eczema   . Hyperlipidemia   . Hypertension   . Psychosis ([place])   . Scleroderma ([place])   . Shingles     Past Surgical History:  Procedure Laterality Date  . COLONOSCOPY  06/26/2000   [name],   . COLONOSCOPY N/A 12/25/2013   Procedure: COLONOSCOPY;  Surgeon: Danie Binder, MD;  [facility];  Service: Endoscopy;  Laterality: N/A;  10:45AM  . TOTAL ABDOMINAL HYSTERECTOMY W/ BILATERAL SALPINGOOPHORECTOMY  1994    Current Outpatient Medications on File Prior to Visit  Medication Sig Dispense Refill  . cetirizine (ZYRTEC) 10 MG tablet Take 1 tablet (10 mg total) by mouth daily. 90 tablet 1  . mupirocin ointment (BACTROBAN) 2 % Place 1 application into the nose 2 (two) times daily. 22 g 0  .  aspirin EC 81 MG tablet Take 81 mg by mouth daily.    . calcium carbonate (OS-CAL) 600 MG TABS tablet Take 600 mg by mouth 2 (two) times daily with a meal.    . diltiazem (CARDIZEM CD) 300 MG 24 hr capsule TAKE 1 CAPSULE BY MOUTH DAILY. 30 capsule 6  . hydrochlorothiazide (HYDRODIURIL) 25 MG tablet TAKE ONE TABLET BY MOUTH DAILY. 30 tablet 5  . hydrOXYzine (ATARAX/VISTARIL) 50 MG tablet 1 tab at bedtime 90 tablet 1  . Multiple Vitamin (MULTIVITAMIN) capsule Take 1 capsule by mouth daily.      . nortriptyline (PAMELOR) 50 MG capsule Take 2 capsules (100 mg total) by mouth at bedtime. 60 capsule 5  . potassium chloride (K-DUR) 10 MEQ tablet TAKE ONE TABLET BY MOUTH TWICE DAILY. 180 tablet 1  . rosuvastatin (CRESTOR) 10 MG tablet Take 1 tablet (10 mg total) by mouth daily. 90 tablet 1   No current facility-administered medications on file prior to visit.    Social History   Socioeconomic History  . Marital status: Single    Spouse name: Not on file  . Number of children: 0  . Years of education: 25  . Highest education level: 12th grade  Occupational History  . Occupation: disabled     Fish farm manager: UNEMPLOYED  Tobacco Use  . Smoking status: Never Smoker  . Smokeless tobacco: Never Used  Substance  and Sexual Activity  . Alcohol use: No  . Drug use: No  . Sexual activity: Not Currently    Birth control/protection: Surgical  Other Topics Concern  . Not on file  Social History Narrative   Lives alone in apartment    Social Determinants of Health   Financial Resource Strain:   . Difficulty of Paying Living Expenses: Not on file  Food Insecurity:   . Worried About Charity fundraiser in the Last Year: Not on file  . Ran Out of Food in the Last Year: Not on file  Transportation Needs:   . Lack of Transportation (Medical): Not on file  . Lack of Transportation (Non-Medical): Not on file  Physical Activity:   . Days of Exercise per Week: Not on file  . Minutes of Exercise per  Session: Not on file  Stress:   . Feeling of Stress : Not on file  Social Connections:   . Frequency of Communication with Friends and Family: Not on file  . Frequency of Social Gatherings with Friends and Family: Not on file  . Attends Religious Services: Not on file  . Active Member of Clubs or Organizations: Not on file  . Attends Archivist Meetings: Not on file  . Marital Status: Not on file  Intimate Partner Violence:   . Fear of Current or Ex-Partner: Not on file  . Emotionally Abused: Not on file  . Physically Abused: Not on file  . Sexually Abused: Not on file    Family History  Problem Relation Age of Onset  . Hypertension Mother   . Heart disease Mother        CAD  . Hypertension Father         Severe DJD/ macrobacterium  . Hypertension Sister   . Hypertension Sister   . Fibromyalgia Sister   . Diabetes Brother   . Colon cancer Neg Hx     BP 130/70   Pulse 88   Temp (!) 97 F (36.1 C)   Ht 5' 0.5" (1.537 m)   Wt 172 lb (78 kg)   BMI 33.04 kg/m   Body mass index is 33.04 kg/m.     Objective:   Physical Exam Vitals and nursing note reviewed.  Constitutional:      Appearance: She is well-developed.  HENT:     Head: Normocephalic and atraumatic.  Eyes:     Conjunctiva/sclera: Conjunctivae normal.     Pupils: Pupils are equal, round, and reactive to light.  Cardiovascular:     Rate and Rhythm: Normal rate and regular rhythm.  Pulmonary:     Effort: Pulmonary effort is normal.  Abdominal:     Palpations: Abdomen is soft.  Musculoskeletal:     Cervical back: Normal range of motion and neck supple.       Legs:  Skin:    General: Skin is warm and dry.  Neurological:     Mental Status: She is alert and oriented to person, place, and time.     Cranial Nerves: No cranial nerve deficit.     Motor: No abnormal muscle tone.     Coordination: Coordination normal.     Deep Tendon Reflexes: Reflexes are normal and symmetric. Reflexes normal.    Psychiatric:        Behavior: Behavior normal.        Thought Content: Thought content normal.        Judgment: Judgment normal.    X-rays were done of  the pelvis, reported separately.       Assessment & Plan:   Encounter Diagnoses  Name Primary?  . Chronic left hip pain Yes  . Trochanteric bursitis, left hip    PROCEDURE NOTE:  The patient request injection, verbal consent was obtained.  The left trochanteric area of the hip was prepped appropriately after time out was performed.   Sterile technique was observed and injection of 1 cc of Depo-Medrol 40 mg with several cc's of plain xylocaine. Anesthesia was provided by ethyl chloride and a 20-gauge needle was used to inject the hip area. The injection was tolerated well.  A band aid dressing was applied.  The patient was advised to apply ice later today and tomorrow to the injection sight as needed.  Return in two weeks.  Call if any problem.  Precautions discussed.   Electronically Signed Sanjuana Kava, MD 1/19/20212:29 PM

## 2019-09-22 ENCOUNTER — Encounter: Payer: Self-pay | Admitting: Orthopaedic Surgery

## 2019-09-22 ENCOUNTER — Ambulatory Visit (INDEPENDENT_AMBULATORY_CARE_PROVIDER_SITE_OTHER): Payer: Medicare Other | Admitting: Orthopaedic Surgery

## 2019-09-22 ENCOUNTER — Other Ambulatory Visit: Payer: Self-pay

## 2019-09-22 VITALS — Temp 96.6°F | Ht 60.5 in | Wt 169.0 lb

## 2019-09-22 DIAGNOSIS — M7062 Trochanteric bursitis, left hip: Secondary | ICD-10-CM | POA: Diagnosis not present

## 2019-09-22 NOTE — Progress Notes (Signed)
Her left hip is much improved.  She has little pain after the injection last time.  She is walking well.  NV intact.  ROM full of the left hip.  Encounter Diagnosis  Name Primary?  . Trochanteric bursitis, left hip Yes   I will see her as needed.  Call if any problem.  Precautions discussed.   Electronically Signed Sanjuana Kava, MD 2/2/20212:45 PM

## 2019-09-30 ENCOUNTER — Other Ambulatory Visit: Payer: Self-pay | Admitting: Family Medicine

## 2019-10-19 ENCOUNTER — Other Ambulatory Visit: Payer: Self-pay | Admitting: Family Medicine

## 2019-12-21 ENCOUNTER — Other Ambulatory Visit: Payer: Self-pay | Admitting: Family Medicine

## 2020-01-11 ENCOUNTER — Other Ambulatory Visit: Payer: Self-pay | Admitting: Family Medicine

## 2020-01-19 ENCOUNTER — Other Ambulatory Visit: Payer: Self-pay | Admitting: Family Medicine

## 2020-02-22 ENCOUNTER — Other Ambulatory Visit: Payer: Self-pay | Admitting: Family Medicine

## 2020-03-21 ENCOUNTER — Other Ambulatory Visit: Payer: Self-pay | Admitting: Family Medicine

## 2020-03-25 ENCOUNTER — Emergency Department (HOSPITAL_COMMUNITY): Payer: Medicare Other

## 2020-03-25 ENCOUNTER — Encounter (HOSPITAL_COMMUNITY): Payer: Self-pay | Admitting: Emergency Medicine

## 2020-03-25 ENCOUNTER — Emergency Department (HOSPITAL_COMMUNITY)
Admission: EM | Admit: 2020-03-25 | Discharge: 2020-03-25 | Disposition: A | Discharge status: Home / Self Care | Payer: Medicare Other | Attending: Emergency Medicine | Admitting: Emergency Medicine

## 2020-03-25 ENCOUNTER — Ambulatory Visit
Admission: EM | Admit: 2020-03-25 | Discharge: 2020-03-25 | Disposition: A | Discharge status: Home / Self Care | Payer: Medicare Other

## 2020-03-25 ENCOUNTER — Other Ambulatory Visit: Payer: Self-pay

## 2020-03-25 DIAGNOSIS — D181 Lymphangioma, any site: Secondary | ICD-10-CM | POA: Diagnosis not present

## 2020-03-25 DIAGNOSIS — S0990XA Unspecified injury of head, initial encounter: Secondary | ICD-10-CM | POA: Diagnosis not present

## 2020-03-25 DIAGNOSIS — G319 Degenerative disease of nervous system, unspecified: Secondary | ICD-10-CM | POA: Diagnosis not present

## 2020-03-25 DIAGNOSIS — J3489 Other specified disorders of nose and nasal sinuses: Secondary | ICD-10-CM | POA: Diagnosis not present

## 2020-03-25 DIAGNOSIS — R519 Headache, unspecified: Secondary | ICD-10-CM | POA: Diagnosis not present

## 2020-03-25 DIAGNOSIS — I10 Essential (primary) hypertension: Secondary | ICD-10-CM | POA: Diagnosis not present

## 2020-03-25 DIAGNOSIS — G9389 Other specified disorders of brain: Secondary | ICD-10-CM | POA: Diagnosis not present

## 2020-03-25 DIAGNOSIS — R220 Localized swelling, mass and lump, head: Secondary | ICD-10-CM | POA: Diagnosis not present

## 2020-03-25 DIAGNOSIS — Z79899 Other long term (current) drug therapy: Secondary | ICD-10-CM | POA: Diagnosis not present

## 2020-03-25 LAB — CBC
HCT: 44.6 % (ref 36.0–46.0)
Hemoglobin: 14.5 g/dL (ref 12.0–15.0)
MCH: 29.5 pg (ref 26.0–34.0)
MCHC: 32.5 g/dL (ref 30.0–36.0)
MCV: 90.8 fL (ref 80.0–100.0)
Platelets: 247 10*3/uL (ref 150–400)
RBC: 4.91 MIL/uL (ref 3.87–5.11)
RDW: 13.9 % (ref 11.5–15.5)
WBC: 5.4 10*3/uL (ref 4.0–10.5)
nRBC: 0 % (ref 0.0–0.2)

## 2020-03-25 LAB — DIFFERENTIAL
Abs Immature Granulocytes: 0.01 10*3/uL (ref 0.00–0.07)
Basophils Absolute: 0 10*3/uL (ref 0.0–0.1)
Basophils Relative: 1 %
Eosinophils Absolute: 0.1 10*3/uL (ref 0.0–0.5)
Eosinophils Relative: 2 %
Immature Granulocytes: 0 %
Lymphocytes Relative: 39 %
Lymphs Abs: 2.1 10*3/uL (ref 0.7–4.0)
Monocytes Absolute: 0.6 10*3/uL (ref 0.1–1.0)
Monocytes Relative: 11 %
Neutro Abs: 2.6 10*3/uL (ref 1.7–7.7)
Neutrophils Relative %: 47 %

## 2020-03-25 LAB — COMPREHENSIVE METABOLIC PANEL
ALT: 23 U/L (ref 0–44)
AST: 35 U/L (ref 15–41)
Albumin: 4.8 g/dL (ref 3.5–5.0)
Alkaline Phosphatase: 91 U/L (ref 38–126)
Anion gap: 12 (ref 5–15)
BUN: 17 mg/dL (ref 8–23)
CO2: 25 mmol/L (ref 22–32)
Calcium: 9.9 mg/dL (ref 8.9–10.3)
Chloride: 101 mmol/L (ref 98–111)
Creatinine, Ser: 1.1 mg/dL — ABNORMAL HIGH (ref 0.44–1.00)
GFR calc Af Amer: 58 mL/min — ABNORMAL LOW (ref >60)
GFR calc non Af Amer: 50 mL/min — ABNORMAL LOW (ref >60)
Glucose, Bld: 108 mg/dL — ABNORMAL HIGH (ref 70–99)
Potassium: 3.3 mmol/L — ABNORMAL LOW (ref 3.5–5.1)
Sodium: 138 mmol/L (ref 135–145)
Total Bilirubin: 0.4 mg/dL (ref 0.3–1.2)
Total Protein: 9.9 g/dL — ABNORMAL HIGH (ref 6.5–8.1)

## 2020-03-25 NOTE — Discharge Instructions (Addendum)
Neurosurgery wants to see in the office call and make an appointment.  This is for the hygroma.  Recommend taking Tylenol as needed for the headaches.  Return for any new or worse symptoms.

## 2020-03-25 NOTE — ED Provider Notes (Signed)
[facility] EMERGENCY DEPARTMENT Provider Note   CSN: [id number] Arrival date & time: 03/25/20  1114     History Chief Complaint  Patient presents with  . Headache    Julia Stevens is a 74 y.o. female.  Patient went to urgent care sent here for further evaluation.  Patient states she had a fall about 8- 9 days ago she thinks she hit the right side of her head.  Patient's had a complaint of a headache though for more than 2 weeks this was there before the fall.  And yesterday she had some double vision problems.  Denies any fevers of stiff neck back pain neck pain any weakness or numbness in her extremities any incontinence problems.  No double vision today.  No visual changes at all today.        Past Medical History:  Diagnosis Date  . Arthritis   . Dysphagia   . Eczema   . Hyperlipidemia   . Hypertension   . Psychosis ([place])   . Scleroderma ([place])   . Shingles     Patient Active Problem List   Diagnosis Date Noted  . Chronic hip pain, left 08/26/2019  . Obesity (BMI 30.0-34.9) 01/17/2018  . Polyarthritis 07/05/2015  . Pruritus 07/05/2015  . Hyperlipidemia LDL goal <100 09/01/2014  . Neck pain on left side 07/23/2011  . Allergic rhinitis 04/29/2009  . Rheumatoid arthritis of hand ([place]) 06/03/2008  . 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 HTN, goal below 140/80 12/26/2007  . Eczema of both hands 12/26/2007    Past Surgical History:  Procedure Laterality Date  . COLONOSCOPY  06/26/2000   [name],   . COLONOSCOPY N/A 12/25/2013   Procedure: COLONOSCOPY;  Surgeon: Danie Binder, MD;  [facility];  Service: Endoscopy;  Laterality: N/A;  10:45AM  . TOTAL ABDOMINAL HYSTERECTOMY W/ BILATERAL SALPINGOOPHORECTOMY  1994     OB History   No obstetric history on file.     Family History  Problem Relation Age of Onset  . Hypertension Mother   . Heart disease Mother        CAD  . Hypertension Father         Severe DJD/ macrobacterium  . Hypertension  Sister   . Hypertension Sister   . Fibromyalgia Sister   . Diabetes Brother   . Colon cancer Neg Hx     Social History   Tobacco Use  . Smoking status: Never Smoker  . Smokeless tobacco: Never Used  Vaping Use  . Vaping Use: Never used  Substance Use Topics  . Alcohol use: No  . Drug use: No    Home Medications Prior to Admission medications   Medication Sig Start Date End Date Taking? Authorizing Provider  calcium carbonate (OS-CAL) 600 MG TABS tablet Take 600 mg by mouth 2 (two) times daily with a meal.   Yes [provider]  diltiazem (CARDIZEM CD) 300 MG 24 hr capsule TAKE 1 CAPSULE BY MOUTH DAILY. 08/31/19  Yes Fayrene Helper, MD  hydrochlorothiazide (HYDRODIURIL) 25 MG tablet TAKE ONE TABLET BY MOUTH DAILY. 03/21/20  Yes Fayrene Helper, MD  hydrOXYzine (ATARAX/VISTARIL) 50 MG tablet TAKE (1) TABLET BY MOUTH AT BEDTIME. Patient taking differently: Take 50 mg by mouth at bedtime. TAKE (1) TABLET BY MOUTH AT BEDTIME. 01/11/20  Yes Fayrene Helper, MD  ibuprofen (ADVIL) 200 MG tablet Take 600 mg by mouth every 6 (six) hours as needed for headache.   Yes [provider]  Multiple Vitamin (MULTIVITAMIN) capsule Take 1 capsule by mouth daily.     Yes [provider]  nortriptyline (PAMELOR) 50 MG capsule TAKE 2 CAPSULES BY MOUTH AT BEDTIME. 03/21/20  Yes Fayrene Helper, MD  potassium chloride (KLOR-CON) 10 MEQ tablet TAKE ONE TABLET BY MOUTH TWICE DAILY. 01/19/20  Yes Fayrene Helper, MD  rosuvastatin (CRESTOR) 10 MG tablet TAKE 1 TABLET BY MOUTH DAILY. 12/21/19  Yes Fayrene Helper, MD  cetirizine (ZYRTEC) 10 MG tablet Take 1 tablet (10 mg total) by mouth daily. 05/22/18   Fayrene Helper, MD  mupirocin ointment (BACTROBAN) 2 % Place 1 application into the nose 2 (two) times daily. 05/22/18   Fayrene Helper, MD    Allergies    Ace inhibitors, Ciprofloxacin, and Penicillins  Review of Systems   Review of Systems    Constitutional: Negative for chills and fever.  HENT: Negative for congestion, rhinorrhea and sore throat.   Eyes: Positive for visual disturbance.  Respiratory: Negative for cough and shortness of breath.   Cardiovascular: Negative for chest pain and leg swelling.  Gastrointestinal: Negative for abdominal pain, diarrhea, nausea and vomiting.  Genitourinary: Negative for dysuria.  Musculoskeletal: Negative for back pain and neck pain.  Skin: Negative for rash.  Neurological: Positive for headaches. Negative for dizziness and light-headedness.  Hematological: Does not bruise/bleed easily.  Psychiatric/Behavioral: Negative for confusion.    Physical Exam Updated Vital Signs BP (!) 151/68   Pulse 60   Temp 98 F (36.7 C) (Oral)   Resp 16   Ht 1.524 m (5')   Wt 76 kg   SpO2 100%   BMI 32.72 kg/m   Physical Exam Vitals and nursing note reviewed.  Constitutional:      General: She is not in acute distress.    Appearance: Normal appearance. She is well-developed.  HENT:     Head: Normocephalic and atraumatic.  Eyes:     Extraocular Movements: Extraocular movements intact.     Conjunctiva/sclera: Conjunctivae normal.     Pupils: Pupils are equal, round, and reactive to light.  Cardiovascular:     Rate and Rhythm: Normal rate and regular rhythm.     Heart sounds: No murmur heard.   Pulmonary:     Effort: Pulmonary effort is normal. No respiratory distress.     Breath sounds: Normal breath sounds.  Abdominal:     Palpations: Abdomen is soft.     Tenderness: There is no abdominal tenderness.  Musculoskeletal:        General: Normal range of motion.     Cervical back: Normal range of motion and neck supple.  Skin:    General: Skin is warm and dry.  Neurological:     General: No focal deficit present.     Mental Status: She is alert and oriented to person, place, and time.     Cranial Nerves: No cranial nerve deficit.     Sensory: No sensory deficit.     Motor: No  weakness.     Coordination: Coordination normal.     ED Results / Procedures / Treatments   Labs (all labs ordered are listed, but only abnormal results are displayed) Labs Reviewed  COMPREHENSIVE METABOLIC PANEL - Abnormal; Notable for the following components:      Result Value   Potassium 3.3 (*)    Glucose, Bld 108 (*)    Creatinine, Ser 1.10 (*)    Total Protein 9.9 (*)    GFR calc non Af  Amer 50 (*)    GFR calc Af Amer 58 (*)    All other components within normal limits  CBC  DIFFERENTIAL    EKG None  Radiology CT HEAD WO CONTRAST  Result Date: 03/25/2020 CLINICAL DATA:  Headache, new or worsening. Additional provided: Patient reports fall 89 days ago, headache to right forehead since fall. EXAM: CT HEAD WITHOUT CONTRAST TECHNIQUE: Contiguous axial images were obtained from the base of the skull through the vertex without intravenous contrast. COMPARISON:  Head CT 01/25/2006 FINDINGS: Brain: Cerebral volume is normal for age. However, there is mild-to-moderate cerebellar atrophy. There is mild asymmetric CSF density along the left cerebellar hemisphere measuring up to 6 mm in thickness, which may reflect a small posterior fossa subdural hygroma There is no acute intracranial hemorrhage. No demarcated cortical infarct. No evidence of intracranial mass. No midline shift. Vascular: No hyperdense vessel.  Atherosclerotic calcifications Skull: Normal. Negative for fracture or focal lesion. Sinuses/Orbits: Visualized orbits show no acute finding. Mild ethmoid sinus mucosal thickening. No significant mastoid effusion. IMPRESSION: No evidence of intracranial hemorrhage or acute infarct. Mild asymmetric CSF density along the left cerebellar hemisphere measuring up to 6 mm in thickness, which may reflect a small posterior fossa subdural hygroma. Mild to moderate cerebellar atrophy. Mild ethmoid sinus mucosal thickening. Electronically Signed   By: Kellie Simmering DO   On: 03/25/2020 12:50   MR  BRAIN WO CONTRAST  Result Date: 03/25/2020 CLINICAL DATA:  Chronic headache. EXAM: MRI HEAD WITHOUT CONTRAST TECHNIQUE: Multiplanar, multiecho pulse sequences of the brain and surrounding structures were obtained without intravenous contrast. COMPARISON:  CT head 03/25/2020 FINDINGS: Brain: Ventricle size and cerebral volume normal. Negative for acute or chronic infarction. Normal white matter. Negative for hemorrhage or mass 6 mm CSF fluid collection under the tentorium with mass-effect on the superior to left cerebellum. This is unchanged from the prior CT and most with compatible with CSF hygroma. No blood products or debris in the fluid. Vascular: Normal arterial flow voids Skull and upper cervical spine: Negative Sinuses/Orbits: Negative Other: None IMPRESSION: 6 mm thick CSF hygroma below the tentorium on the left with mild mass-effect on the cerebellum. No hemorrhage, infarction or mass identified. Remainder of the brain is normal. Electronically Signed   By: Franchot Gallo M.D.   On: 03/25/2020 18:21    Procedures Procedures (including critical care time)  Medications Ordered in ED Medications - No data to display  ED Course  I have reviewed the triage vital signs and the nursing notes.  Pertinent labs & imaging results that were available during my care of the patient were reviewed by me and considered in my medical decision making (see chart for details).    MDM Rules/Calculators/A&P                          Work-up here started with head CT because of the history of the fall.  That raise some concerns about a hygroma in the cerebellum area.  This led to MRI brain.  Which confirmed that.  There was some question of whether there was mass-effect on the brainstem.  Discussed with on-call neuro hospitalist Dr. Malen Gauze.  And he recommended discussing with neurosurgery.  So spoke with Dr. Reatha Armour from [place] [facility].  He reviewed things.  Felt that this did not require admission he would  see her in the office.  So referral to the office provided.  Patient without any double vision at all  today.  Neuro exam here today very normal.  Labs should without significant findings other than a mild hypokalemia with a potassium of 3.3.  Do not feel that it needs supplementation.  Patient stable for discharge home.  No evidence of any traumatic event related to the fall about 8 days ago.   Final Clinical Impression(s) / ED Diagnoses Final diagnoses:  Headache disorder  Hygroma    Rx / DC Orders ED Discharge Orders    None       Fredia Sorrow, MD 03/25/20 2010

## 2020-03-25 NOTE — ED Triage Notes (Signed)
Patient is being discharged from the Urgent Care and sent to the Emergency Department via private vehicle . Per B Wurst, patient is in need of higher level of care due to potential brain bleed . Patient is aware and verbalizes understanding of plan of care.  Vitals:   03/25/20 1051  BP: 138/86  Pulse: 83  Resp: 17  Temp: 98.4 F (36.9 C)  SpO2: 97%

## 2020-03-25 NOTE — ED Notes (Signed)
Pt transported to MRI 

## 2020-03-25 NOTE — ED Triage Notes (Signed)
Pt fell x 8-9days ago. Pt states she doesn't think she hit her head.  Pt reports headache to RT forehead since her fall.

## 2020-03-30 DIAGNOSIS — H532 Diplopia: Secondary | ICD-10-CM | POA: Diagnosis not present

## 2020-03-30 DIAGNOSIS — D181 Lymphangioma, any site: Secondary | ICD-10-CM | POA: Diagnosis not present

## 2020-04-08 ENCOUNTER — Other Ambulatory Visit: Payer: Self-pay | Admitting: Family Medicine

## 2020-04-22 ENCOUNTER — Other Ambulatory Visit: Payer: Self-pay | Admitting: Family Medicine

## 2020-04-26 ENCOUNTER — Ambulatory Visit (HOSPITAL_COMMUNITY)
Admission: RE | Admit: 2020-04-26 | Discharge: 2020-04-26 | Disposition: A | Discharge status: Home / Self Care | Payer: Medicare Other | Source: Ambulatory Visit | Attending: Family Medicine | Admitting: Family Medicine

## 2020-04-26 ENCOUNTER — Encounter: Payer: Self-pay | Admitting: Family Medicine

## 2020-04-26 ENCOUNTER — Ambulatory Visit (INDEPENDENT_AMBULATORY_CARE_PROVIDER_SITE_OTHER): Payer: Medicare Other | Admitting: Family Medicine

## 2020-04-26 ENCOUNTER — Other Ambulatory Visit: Payer: Self-pay

## 2020-04-26 VITALS — BP 121/75 | HR 92 | Resp 16 | Ht 61.0 in | Wt 177.1 lb

## 2020-04-26 DIAGNOSIS — J302 Other seasonal allergic rhinitis: Secondary | ICD-10-CM

## 2020-04-26 DIAGNOSIS — Z23 Encounter for immunization: Secondary | ICD-10-CM

## 2020-04-26 DIAGNOSIS — H532 Diplopia: Secondary | ICD-10-CM | POA: Insufficient documentation

## 2020-04-26 DIAGNOSIS — M7989 Other specified soft tissue disorders: Secondary | ICD-10-CM | POA: Insufficient documentation

## 2020-04-26 DIAGNOSIS — M79662 Pain in left lower leg: Secondary | ICD-10-CM | POA: Diagnosis not present

## 2020-04-26 DIAGNOSIS — Z09 Encounter for follow-up examination after completed treatment for conditions other than malignant neoplasm: Secondary | ICD-10-CM

## 2020-04-26 DIAGNOSIS — F29 Unspecified psychosis not due to a substance or known physiological condition: Secondary | ICD-10-CM

## 2020-04-26 DIAGNOSIS — R600 Localized edema: Secondary | ICD-10-CM | POA: Diagnosis not present

## 2020-04-26 DIAGNOSIS — I10 Essential (primary) hypertension: Secondary | ICD-10-CM

## 2020-04-26 NOTE — Progress Notes (Signed)
   Julia Stevens     MRN: [id number]      DOB: 1946-08-08   HPI [name] is here for follow up and re-evaluation of chronic medical conditions, medication management and review of any available recent lab and radiology data.  Preventive health is updated, specifically  Cancer screening and Immunization.   Was recently in the eD and this visit is reviewed and questions answered. Incidental findin on brain scan was a hygroma, neurosurgery states no intervention needed, pt c/o diploplia persisting.  reactions to current medications since the last visit.  sttill has double vision, neurosurgery states no need for surgery for hygroma, needs eye exam  ROS Denies recent fever or chills. Denies sinus pressure, nasal congestion, ear pain or sore throat. Denies chest congestion, productive cough or wheezing. Denies chest pains, palpitations and c/o calf  leg swelling Denies abdominal pain, nausea, vomiting,diarrhea or constipation.   Denies dysuria, frequency, hesitancy or incontinence. Denies uncontrolled  joint pain, swelling and limitation in mobility. Denies headaches, seizures, numbness, or tingling. Denies depression, anxiety or insomnia. C/o skin tag/ mole on anterior chin, not painfully but growing, wants this removed and will call back PE  BP 121/75   Pulse 92   Resp 16   Ht 5\' 1"  (1.549 m)   Wt 177 lb 1.9 oz (80.3 kg)   SpO2 94%   BMI 33.47 kg/m   Patient alert and oriented and in no cardiopulmonary distress.  HEENT: No facial asymmetry, EOMI,     Neck supple .pigmented non tender skin tag anterior neck  Chest: Clear to auscultation bilaterally.  CVS: S1, S2 no murmurs, no S3.Regular rate.  ABD: Soft non tender.   Ext: left calf swelling 16.5 in, right leg 15 inch  MS: decreased  ROM spine, shoulders, hips and knees.  Skin: Intact, no ulcerations or rash noted.skin tag anterior neck  Psych: Good eye contact, normal affect. Memory intact not anxious or depressed  appearing.  CNS: CN 2-12 intact, power,  normal throughout.no focal deficits noted.   Assessment & Plan  Double vision Needs eye exam , neurosurgery has cleared hygroma, referfor same, present for past 1 month, no change  Left leg swelling 2 week h/o left calf swelling , needs doppler  HTN, goal below 140/80 Controlled, no change in medication   Encounter for examination following treatment at hospital Notes [facility] ED visit on 03/25/2020 reviewed with the patient, also results of imaging studies. Need to have eye exam for diploplia, and will attempts to get this done asap, states when she called on her own she was given an appt in december  Allergic rhinitis Controlled, no change in medication   Nonorganic psychosis Controlled, no change in medication

## 2020-04-26 NOTE — Patient Instructions (Signed)
F/u in office with MD in 5 months, call if yyou need me sooner  Flu vaccine today  [place] of left calf today to evaluate swelling and r/o clot  You are referred to [facility], this will be in [place], re double vision we will get soonest available appointmen, no eye Specialist in [place] for your problem   Call when you decide on getting the nodule on the fron of your neck removed,[place]

## 2020-04-26 NOTE — Assessment & Plan Note (Signed)
2 week h/o left calf swelling , needs doppler

## 2020-04-26 NOTE — Assessment & Plan Note (Addendum)
Needs eye exam , neurosurgery has cleared hygroma, referfor same, present for past 1 month, no change

## 2020-04-27 ENCOUNTER — Encounter: Payer: Self-pay | Admitting: Family Medicine

## 2020-04-27 DIAGNOSIS — Z09 Encounter for follow-up examination after completed treatment for conditions other than malignant neoplasm: Secondary | ICD-10-CM | POA: Insufficient documentation

## 2020-04-27 NOTE — Assessment & Plan Note (Signed)
Controlled, no change in medication  

## 2020-04-27 NOTE — Assessment & Plan Note (Signed)
Notes [facility] ED visit on 03/25/2020 reviewed with the patient, also results of imaging studies. Need to have eye exam for diploplia, and will attempts to get this done asap, states when she called on her own she was given an appt in december

## 2020-04-28 ENCOUNTER — Telehealth: Payer: Self-pay | Admitting: Family Medicine

## 2020-04-28 NOTE — Telephone Encounter (Signed)
Patient in regards to a referral to dr Caesar Bookman office and also she is wanting advice on what to do for the pain in her left leg she stated its almost like a golfball and that sometimes it swells phone is [phone]

## 2020-04-28 NOTE — Telephone Encounter (Signed)
Patient will call Dr.Bradley. If swelling not any better by next week patient will call and make an appt to be seen

## 2020-05-11 ENCOUNTER — Telehealth: Payer: Self-pay | Admitting: Family Medicine

## 2020-05-11 ENCOUNTER — Other Ambulatory Visit: Payer: Self-pay

## 2020-05-11 DIAGNOSIS — H532 Diplopia: Secondary | ICD-10-CM

## 2020-05-11 NOTE — Telephone Encounter (Signed)
Patient scheduled 05/18/20 @ 8:45 with dr Coralyn Pear

## 2020-05-11 NOTE — Telephone Encounter (Signed)
Urgent referral is already in. Just needs a provider that accepts medicaid. You can edit the referral to any provider you find that takes her ins.

## 2020-05-11 NOTE — Telephone Encounter (Signed)
Patient is calling requesting a referral to dr Coralyn Pear in [place] for ophthalmology phone is [phone]

## 2020-05-17 DIAGNOSIS — H532 Diplopia: Secondary | ICD-10-CM | POA: Diagnosis not present

## 2020-05-17 DIAGNOSIS — H5010 Unspecified exotropia: Secondary | ICD-10-CM | POA: Diagnosis not present

## 2020-05-17 DIAGNOSIS — H43812 Vitreous degeneration, left eye: Secondary | ICD-10-CM | POA: Diagnosis not present

## 2020-05-17 DIAGNOSIS — H02403 Unspecified ptosis of bilateral eyelids: Secondary | ICD-10-CM | POA: Diagnosis not present

## 2020-05-17 DIAGNOSIS — R519 Headache, unspecified: Secondary | ICD-10-CM | POA: Diagnosis not present

## 2020-05-17 DIAGNOSIS — H25813 Combined forms of age-related cataract, bilateral: Secondary | ICD-10-CM | POA: Diagnosis not present

## 2020-05-17 DIAGNOSIS — H5022 Vertical strabismus, left eye: Secondary | ICD-10-CM | POA: Diagnosis not present

## 2020-05-18 ENCOUNTER — Encounter (INDEPENDENT_AMBULATORY_CARE_PROVIDER_SITE_OTHER): Payer: Self-pay | Admitting: Ophthalmology

## 2020-05-20 ENCOUNTER — Other Ambulatory Visit: Payer: Self-pay | Admitting: Family Medicine

## 2020-05-23 ENCOUNTER — Encounter (INDEPENDENT_AMBULATORY_CARE_PROVIDER_SITE_OTHER): Payer: Self-pay | Admitting: Ophthalmology

## 2020-06-16 DIAGNOSIS — H5010 Unspecified exotropia: Secondary | ICD-10-CM | POA: Diagnosis not present

## 2020-06-16 DIAGNOSIS — H02403 Unspecified ptosis of bilateral eyelids: Secondary | ICD-10-CM | POA: Diagnosis not present

## 2020-06-16 DIAGNOSIS — H5022 Vertical strabismus, left eye: Secondary | ICD-10-CM | POA: Diagnosis not present

## 2020-06-16 DIAGNOSIS — H5052 Exophoria: Secondary | ICD-10-CM | POA: Diagnosis not present

## 2020-06-16 DIAGNOSIS — H532 Diplopia: Secondary | ICD-10-CM | POA: Diagnosis not present

## 2020-06-16 DIAGNOSIS — H5347 Heteronymous bilateral field defects: Secondary | ICD-10-CM | POA: Diagnosis not present

## 2020-06-16 DIAGNOSIS — H25813 Combined forms of age-related cataract, bilateral: Secondary | ICD-10-CM | POA: Diagnosis not present

## 2020-06-16 DIAGNOSIS — H43812 Vitreous degeneration, left eye: Secondary | ICD-10-CM | POA: Diagnosis not present

## 2020-06-16 DIAGNOSIS — H4322 Crystalline deposits in vitreous body, left eye: Secondary | ICD-10-CM | POA: Diagnosis not present

## 2020-06-16 DIAGNOSIS — R519 Headache, unspecified: Secondary | ICD-10-CM | POA: Diagnosis not present

## 2020-06-22 ENCOUNTER — Other Ambulatory Visit: Payer: Self-pay | Admitting: Family Medicine

## 2020-07-20 ENCOUNTER — Telehealth: Payer: Self-pay
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
## 2020-07-20 NOTE — Telephone Encounter (Signed)
noted 

## 2020-07-21 ENCOUNTER — Encounter: Payer: Self-pay | Admitting: Family Medicine

## 2020-07-21 ENCOUNTER — Other Ambulatory Visit: Payer: Self-pay

## 2020-07-21 ENCOUNTER — Ambulatory Visit (INDEPENDENT_AMBULATORY_CARE_PROVIDER_SITE_OTHER): Payer: Medicare Other | Admitting: Family Medicine

## 2020-07-21 VITALS — BP 126/66 | HR 87 | Resp 16 | Ht 61.0 in | Wt 181.1 lb

## 2020-07-21 DIAGNOSIS — E785 Hyperlipidemia, unspecified: Secondary | ICD-10-CM | POA: Diagnosis not present

## 2020-07-21 DIAGNOSIS — B369 Superficial mycosis, unspecified: Secondary | ICD-10-CM | POA: Diagnosis not present

## 2020-07-21 DIAGNOSIS — I10 Essential (primary) hypertension: Secondary | ICD-10-CM

## 2020-07-21 DIAGNOSIS — L299 Pruritus, unspecified: Secondary | ICD-10-CM | POA: Diagnosis not present

## 2020-07-21 DIAGNOSIS — F29 Unspecified psychosis not due to a substance or known physiological condition: Secondary | ICD-10-CM | POA: Diagnosis not present

## 2020-07-21 DIAGNOSIS — I701 Atherosclerosis of renal artery: Secondary | ICD-10-CM | POA: Insufficient documentation

## 2020-07-21 MED ORDER — ROSUVASTATIN CALCIUM 10 MG PO TABS: 10.0000 mg | ORAL_TABLET | Freq: Every day | ORAL | 1 refills | Status: DC

## 2020-07-21 MED ORDER — FLUCONAZOLE 100 MG PO TABS: 100.0000 mg | ORAL_TABLET | Freq: Every day | ORAL | 0 refills | Status: DC

## 2020-07-21 MED ORDER — HYDROCHLOROTHIAZIDE 25 MG PO TABS: 25.0000 mg | ORAL_TABLET | Freq: Every day | ORAL | 1 refills | Status: DC

## 2020-07-21 MED ORDER — TERBINAFINE HCL 250 MG PO TABS: 250.0000 mg | ORAL_TABLET | Freq: Every day | ORAL | 0 refills | Status: DC
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
## 2020-07-22 ENCOUNTER — Other Ambulatory Visit: Payer: Self-pay | Admitting: Family Medicine

## 2020-07-25 DIAGNOSIS — M79674 Pain in right toe(s): Secondary | ICD-10-CM | POA: Diagnosis not present

## 2020-07-25 DIAGNOSIS — L851 Acquired keratosis [keratoderma] palmaris et plantaris: Secondary | ICD-10-CM | POA: Diagnosis not present

## 2020-07-25 DIAGNOSIS — M7740 Metatarsalgia, unspecified foot: Secondary | ICD-10-CM | POA: Diagnosis not present

## 2020-07-25 DIAGNOSIS — M2011 Hallux valgus (acquired), right foot: Secondary | ICD-10-CM | POA: Diagnosis not present

## 2020-07-25 DIAGNOSIS — I739 Peripheral vascular disease, unspecified: Secondary | ICD-10-CM | POA: Diagnosis not present

## 2020-07-31 ENCOUNTER — Encounter: Payer: Self-pay | Admitting: Family Medicine

## 2020-07-31 MED ORDER — HYDROXYZINE HCL 10 MG PO TABS: ORAL_TABLET | ORAL | 1 refills | Status: DC

## 2020-07-31 MED ORDER — CETIRIZINE HCL 10 MG PO TABS: 10.0000 mg | ORAL_TABLET | Freq: Every day | ORAL | 11 refills | Status: DC

## 2020-07-31 NOTE — Assessment & Plan Note (Signed)
Oral antifungals and yeast medication prescribed

## 2020-07-31 NOTE — Assessment & Plan Note (Signed)
Chronic and uncontrolled, daily zyrtec and hydroxyzine

## 2020-07-31 NOTE — Assessment & Plan Note (Signed)
Hyperlipidemia:Low fat diet discussed and encouraged.   Lipid Panel  Lab Results  Component Value Date   CHOL 129 08/26/2019   HDL 43 (L) 08/26/2019   LDLCALC 67 08/26/2019   TRIG 108 08/26/2019   CHOLHDL 3.0 08/26/2019   Updated lab needed at/ before next visit.   

## 2020-07-31 NOTE — Assessment & Plan Note (Signed)
Controlled, no change in medication DASH diet and commitment to daily physical activity for a minimum of 30 minutes discussed and encouraged, as a part of hypertension management. The importance of attaining a healthy weight is also discussed.  BP/Weight 07/21/2020 04/26/2020 03/25/2020 03/25/2020 09/22/2019 04/17/9370 01/27/6788  Systolic BP 381 017 510 258 - 527 782  Diastolic BP 66 75 72 86 - 70 78  Wt. (Lbs) 181.12 177.12 167.55 - 169 172 173  BMI 34.22 33.47 32.72 - 32.46 33.04 33.23
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
## 2020-08-02 ENCOUNTER — Encounter: Payer: Self-pay | Admitting: Nurse Practitioner

## 2020-08-02 ENCOUNTER — Other Ambulatory Visit: Payer: Self-pay

## 2020-08-02 ENCOUNTER — Ambulatory Visit (INDEPENDENT_AMBULATORY_CARE_PROVIDER_SITE_OTHER): Payer: Medicare Other | Admitting: Nurse Practitioner

## 2020-08-02 DIAGNOSIS — Z0000 Encounter for general adult medical examination without abnormal findings: Secondary | ICD-10-CM | POA: Diagnosis not present

## 2020-08-02 NOTE — Progress Notes (Signed)
Subjective:   Julia Stevens is a 74 y.o. female who presents for Medicare Annual (Subsequent) preventive examination.        Objective:    There were no vitals filed for this visit. There is no height or weight on file to calculate BMI.  Advanced Directives 03/25/2020 07/28/2018 07/22/2017 02/15/2017 12/25/2013  Does Patient Have a Medical Advance Directive? No No No No Patient does not have advance directive;Patient would not like information  Would patient like information on creating a medical advance directive? No - Patient declined No - Patient declined No - Patient declined No - Patient declined -  Pre-existing out of facility DNR order (yellow form or pink MOST form) - - - - No    Current Medications (verified) Outpatient Encounter Medications as of 08/02/2020  Medication Sig  . calcium carbonate (OS-CAL) 600 MG TABS tablet Take 600 mg by mouth 2 (two) times daily with a meal.  . cetirizine (ZYRTEC) 10 MG tablet Take 1 tablet (10 mg total) by mouth daily.  Marland Kitchen diltiazem (CARDIZEM CD) 300 MG 24 hr capsule TAKE 1 CAPSULE BY MOUTH DAILY.  . fluconazole (DIFLUCAN) 100 MG tablet Take 1 tablet (100 mg total) by mouth daily.  . hydrochlorothiazide (HYDRODIURIL) 25 MG tablet TAKE ONE TABLET BY MOUTH DAILY.  . hydrOXYzine (ATARAX/VISTARIL) 10 MG tablet Take one tablet at bedtime for sleep and for itching  . ibuprofen (ADVIL) 200 MG tablet Take 600 mg by mouth every 6 (six) hours as needed for headache.  . Multiple Vitamin (MULTIVITAMIN) capsule Take 1 capsule by mouth daily.    . mupirocin ointment (BACTROBAN) 2 % Place 1 application into the nose 2 (two) times daily.  . nortriptyline (PAMELOR) 50 MG capsule TAKE 2 CAPSULES BY MOUTH AT BEDTIME.  Marland Kitchen potassium chloride (KLOR-CON) 10 MEQ tablet TAKE ONE TABLET BY MOUTH TWICE DAILY.  . rosuvastatin (CRESTOR) 10 MG tablet Take 1 tablet (10 mg total) by mouth daily.  Marland Kitchen terbinafine (LAMISIL) 250 MG tablet Take 1 tablet (250 mg total) by mouth  daily.   No facility-administered encounter medications on file as of 08/02/2020.    Allergies (verified) Ace inhibitors, Ciprofloxacin, and Penicillins   History: Past Medical History:  Diagnosis Date  . Arthritis   . Dysphagia   . Eczema   . Hyperlipidemia   . Hypertension   . Psychosis ([place])   . Scleroderma ([place])   . Shingles    Past Surgical History:  Procedure Laterality Date  . COLONOSCOPY  06/26/2000   [name],   . COLONOSCOPY N/A 12/25/2013   Procedure: COLONOSCOPY;  Surgeon: Danie Binder, MD;  Location: [facility];  Service: Endoscopy;  Laterality: N/A;  10:45AM  . TOTAL ABDOMINAL HYSTERECTOMY W/ BILATERAL SALPINGOOPHORECTOMY  1994   Family History  Problem Relation Age of Onset  . Hypertension Mother   . Heart disease Mother        CAD  . Hypertension Father         Severe DJD/ macrobacterium  . Hypertension Sister   . Hypertension Sister   . Fibromyalgia Sister   . Diabetes Brother   . Colon cancer Neg Hx    Social History   Socioeconomic History  . Marital status: Single    Spouse name: Not on file  . Number of children: 0  . Years of education: 27  . Highest education level: 12th grade  Occupational History  . Occupation: disabled     Fish farm manager: UNEMPLOYED  Tobacco Use  .  Smoking status: Never Smoker  . Smokeless tobacco: Never Used  Vaping Use  . Vaping Use: Never used  Substance and Sexual Activity  . Alcohol use: No  . Drug use: No  . Sexual activity: Not Currently    Birth control/protection: Surgical  Other Topics Concern  . Not on file  Social History Narrative   Lives alone in apartment    Social Determinants of Health   Financial Resource Strain: Not on file  Food Insecurity: Not on file  Transportation Needs: Not on file  Physical Activity: Not on file  Stress: Not on file  Social Connections: Not on file    Tobacco Counseling Counseling given: Not Answered   Clinical Intake:                 Diabetic? No           Activities of Daily Living No flowsheet data found.  Patient Care Team: Fayrene Helper, MD as PCP - [facility], Marga Melnick, MD (Inactive) as Consulting Physician (Gastroenterology)  Indicate any recent Medical Services you may have received from other than [facility] providers in the past year (date may be approximate).     Assessment:   This is a routine wellness examination for Julia Stevens.  Hearing/Vision screen No exam data present  Dietary issues and exercise activities discussed:    Goals    . DIET - DECREASE SODA OR JUICE INTAKE    . DIET - INCREASE WATER INTAKE      Depression Screen PHQ 2/9 Scores 07/21/2020 04/26/2020 07/30/2019 02/10/2019 11/03/2018 07/28/2018 05/22/2018  PHQ - 2 Score 0 0 0 0 3 0 4  PHQ- 9 Score - - - - 18 - 10    Fall Risk Fall Risk  07/21/2020 04/26/2020 08/26/2019 07/30/2019 02/10/2019  Falls in the past year? 0 1 1 1  0  Number falls in past yr: - 1 1 1  0  Injury with Fall? - 0 1 1 0  Risk for fall due to : - - - History of fall(s);Impaired balance/gait -  Follow up - - - Falls evaluation completed;Education provided;Falls prevention discussed -    FALL RISK PREVENTION PERTAINING TO THE HOME:  Any stairs in or around the home? No  If so, are there any without handrails? No  Home free of loose throw rugs in walkways, pet beds, electrical cords, etc? Yes  Adequate lighting in your home to reduce risk of falls? Yes   ASSISTIVE DEVICES UTILIZED TO PREVENT FALLS:  Life alert? No  Use of a cane, walker or w/c? No  Grab bars in the bathroom? Yes  Shower chair or bench in shower? Yes  Elevated toilet seat or a handicapped toilet? Yes   TIMED UP AND GO:  Was the test performed? No .     Cognitive Function:     6CIT Screen 07/30/2019 07/28/2018 07/22/2017  What Year? 0 points 0 points 0 points  What month? 0 points 0 points 0 points  What time? 0 points 0 points 0 points  Count back from 20 0 points 0 points 0 points  Months in  reverse 4 points 4 points 4 points  Repeat phrase 6 points 0 points 2 points  Total Score 10 4 6     Immunizations Immunization History  Administered Date(s) Administered  . 19-influenza Whole 07/23/2011  . Fluad Quad(high Dose 65+) 07/13/2019, 04/26/2020  . Influenza Split 07/28/2012, 06/22/2014  . Influenza Whole 07/04/2007, 06/29/2009, 07/10/2010  . Influenza,inj,Quad PF,6+ Mos  05/20/2013, 07/05/2015, 07/20/2016, 07/22/2017, 05/22/2018  . Moderna Sars-Covid-2 Vaccination 10/14/2019, 11/11/2019  . Pneumococcal Conjugate-13 03/01/2014  . Pneumococcal Polysaccharide-23 08/21/2013  . Td 01/04/2004  . Tdap 07/23/2011  . Zoster 09/22/2013    TDAP status: Up to date  Flu Vaccine status: Up to date  Pneumococcal vaccine status: Up to date  Covid-19 vaccine status: Completed vaccines  Qualifies for Shingles Vaccine? Yes   Zostavax completed Yes   Shingrix Completed?: No.    Education has been provided regarding the importance of this vaccine. Patient has been advised to call insurance company to determine out of pocket expense if they have not yet received this vaccine. Advised may also receive vaccine at local pharmacy or Health Dept. Verbalized acceptance and understanding.  Screening Tests Health Maintenance  Topic Date Due  . COVID-19 Vaccine (3 - Booster for Moderna series) 05/13/2020  . TETANUS/TDAP  07/22/2021  . MAMMOGRAM  08/09/2021  . COLONOSCOPY  12/26/2023  . INFLUENZA VACCINE  Completed  . DEXA SCAN  Completed  . Hepatitis C Screening  Completed  . PNA vac Low Risk Adult  Completed    Health Maintenance  Health Maintenance Due  Topic Date Due  . COVID-19 Vaccine (3 - Booster for Moderna series) 05/13/2020    Colorectal cancer screening: No longer required.   Mammogram status: Completed normal . Repeat every year  Bone Density Status: Completed   Lung Cancer Screening: (Low Dose CT Chest recommended if Age 64-80 years, 30 pack-year currently smoking OR  have quit w/in 15years.) does not qualify.     Additional Screening:  Hepatitis C Screening: does qualify; Completed.  Vision Screening: Recommended annual ophthalmology exams for early detection of glaucoma and other disorders of the eye. Is the patient up to date with their annual eye exam?  Yes  Who is the provider or what is the name of the office in which the patient attends annual eye exams? Dr. Katy Fitch  If pt is not established with a provider, would they like to be referred to a provider to establish care? No .   Dental Screening: Recommended annual dental exams for proper oral hygiene  Community Resource Referral / Chronic Care Management: CRR required this visit?  No   CCM required this visit?  No      Plan:     I have personally reviewed and noted the following in the patient's chart:   . Medical and social history . Use of alcohol, tobacco or illicit drugs  . Current medications and supplements . Functional ability and status . Nutritional status . Physical activity . Advanced directives . List of other physicians . Hospitalizations, surgeries, and ER visits in previous 12 months . Vitals . Screenings to include cognitive, depression, and falls . Referrals and appointments  In addition, I have reviewed and discussed with patient certain preventive protocols, quality metrics, and best practice recommendations. A written personalized care plan for preventive services as well as general preventive health recommendations were provided to patient.     Lonn Georgia, LPN   71/01/2693   Nurse Notes: AWV conducted over the phone with pt consent to televisit via audio. Pt was present in the home at the time of call, and provider was in the office. Call took approx. 20 min.

## 2020-08-02 NOTE — Patient Instructions (Signed)
Julia Stevens , Thank you for taking time to come for your Medicare Wellness Visit. I appreciate your ongoing commitment to your health goals. Please review the following plan we discussed and let me know if I can assist you in the future.   Screening recommendations/referrals: Colonoscopy: Completed; Due 12/26/2023  Mammogram: Completed; Due 08/09/21  Bone Density: Completed  Recommended yearly ophthalmology/optometry visit for glaucoma screening and checkup Recommended yearly dental visit for hygiene and checkup  Vaccinations: Influenza vaccine: Completed  Pneumococcal vaccine: Completed  Tdap vaccine: Completed; 07/22/21  Shingles vaccine: Zoster complete; Education provided for Shingrix  Advanced directives: None   Conditions/risks identified: N/A   Next appointment: 09/26/70 @ 9:40am with Dr. Moshe Cipro    Preventive Care 72 Years and Older, Female Preventive care refers to lifestyle choices and visits with your health care provider that can promote health and wellness. What does preventive care include?  A yearly physical exam. This is also called an annual well check.  Dental exams once or twice a year.  Routine eye exams. Ask your health care provider how often you should have your eyes checked.  Personal lifestyle choices, including:  Daily care of your teeth and gums.  Regular physical activity.  Eating a healthy diet.  Avoiding tobacco and drug use.  Limiting alcohol use.  Practicing safe sex.  Taking low-dose aspirin every day.  Taking vitamin and mineral supplements as recommended by your health care provider. What happens during an annual well check? The services and screenings done by your health care provider during your annual well check will depend on your age, overall health, lifestyle risk factors, and family history of disease. Counseling  Your health care provider may ask you questions about your:  Alcohol use.  Tobacco use.  Drug  use.  Emotional well-being.  Home and relationship well-being.  Sexual activity.  Eating habits.  History of falls.  Memory and ability to understand (cognition).  Work and work Statistician.  Reproductive health. Screening  You may have the following tests or measurements:  Height, weight, and BMI.  Blood pressure.  Lipid and cholesterol levels. These may be checked every 5 years, or more frequently if you are over 27 years old.  Skin check.  Lung cancer screening. You may have this screening every year starting at age 58 if you have a 30-pack-year history of smoking and currently smoke or have quit within the past 15 years.  Fecal occult blood test (FOBT) of the stool. You may have this test every year starting at age 42.  Flexible sigmoidoscopy or colonoscopy. You may have a sigmoidoscopy every 5 years or a colonoscopy every 10 years starting at age 75.  Hepatitis C blood test.  Hepatitis B blood test.  Sexually transmitted disease (STD) testing.  Diabetes screening. This is done by checking your blood sugar (glucose) after you have not eaten for a while (fasting). You may have this done every 1-3 years.  Bone density scan. This is done to screen for osteoporosis. You may have this done starting at age 72.  Mammogram. This may be done every 1-2 years. Talk to your health care provider about how often you should have regular mammograms. Talk with your health care provider about your test results, treatment options, and if necessary, the need for more tests. Vaccines  Your health care provider may recommend certain vaccines, such as:  Influenza vaccine. This is recommended every year.  Tetanus, diphtheria, and acellular pertussis (Tdap, Td) vaccine. You may need a  Td booster every 10 years.  Zoster vaccine. You may need this after age 35.  Pneumococcal 13-valent conjugate (PCV13) vaccine. One dose is recommended after age 49.  Pneumococcal polysaccharide  (PPSV23) vaccine. One dose is recommended after age 66. Talk to your health care provider about which screenings and vaccines you need and how often you need them. This information is not intended to replace advice given to you by your health care provider. Make sure you discuss any questions you have with your health care provider. Document Released: 09/02/2015 Document Revised: 04/25/2016 Document Reviewed: 06/07/2015 Elsevier Interactive Patient Education  2017 [place] Prevention in the Home Falls can cause injuries. They can happen to people of all ages. There are many things you can do to make your home safe and to help prevent falls. What can I do on the outside of my home?  Regularly fix the edges of walkways and driveways and fix any cracks.  Remove anything that might make you trip as you walk through a door, such as a raised step or threshold.  Trim any bushes or trees on the path to your home.  Use bright outdoor lighting.  Clear any walking paths of anything that might make someone trip, such as rocks or tools.  Regularly check to see if handrails are loose or broken. Make sure that both sides of any steps have handrails.  Any raised decks and porches should have guardrails on the edges.  Have any leaves, snow, or ice cleared regularly.  Use sand or salt on walking paths during winter.  Clean up any spills in your garage right away. This includes oil or grease spills. What can I do in the bathroom?  Use night lights.  Install grab bars by the toilet and in the tub and shower. Do not use towel bars as grab bars.  Use non-skid mats or decals in the tub or shower.  If you need to sit down in the shower, use a plastic, non-slip stool.  Keep the floor dry. Clean up any water that spills on the floor as soon as it happens.  Remove soap buildup in the tub or shower regularly.  Attach bath mats securely with double-sided non-slip rug tape.  Do not have  throw rugs and other things on the floor that can make you trip. What can I do in the bedroom?  Use night lights.  Make sure that you have a light by your bed that is easy to reach.  Do not use any sheets or blankets that are too big for your bed. They should not hang down onto the floor.  Have a firm chair that has side arms. You can use this for support while you get dressed.  Do not have throw rugs and other things on the floor that can make you trip. What can I do in the kitchen?  Clean up any spills right away.  Avoid walking on wet floors.  Keep items that you use a lot in easy-to-reach places.  If you need to reach something above you, use a strong step stool that has a grab bar.  Keep electrical cords out of the way.  Do not use floor polish or wax that makes floors slippery. If you must use wax, use non-skid floor wax.  Do not have throw rugs and other things on the floor that can make you trip. What can I do with my stairs?  Do not leave any items on the stairs.  Make sure that there are handrails on both sides of the stairs and use them. Fix handrails that are broken or loose. Make sure that handrails are as long as the stairways.  Check any carpeting to make sure that it is firmly attached to the stairs. Fix any carpet that is loose or worn.  Avoid having throw rugs at the top or bottom of the stairs. If you do have throw rugs, attach them to the floor with carpet tape.  Make sure that you have a light switch at the top of the stairs and the bottom of the stairs. If you do not have them, ask someone to add them for you. What else can I do to help prevent falls?  Wear shoes that:  Do not have high heels.  Have rubber bottoms.  Are comfortable and fit you well.  Are closed at the toe. Do not wear sandals.  If you use a stepladder:  Make sure that it is fully opened. Do not climb a closed stepladder.  Make sure that both sides of the stepladder are  locked into place.  Ask someone to hold it for you, if possible.  Clearly mark and make sure that you can see:  Any grab bars or handrails.  First and last steps.  Where the edge of each step is.  Use tools that help you move around (mobility aids) if they are needed. These include:  Canes.  Walkers.  Scooters.  Crutches.  Turn on the lights when you go into a dark area. Replace any light bulbs as soon as they burn out.  Set up your furniture so you have a clear path. Avoid moving your furniture around.  If any of your floors are uneven, fix them.  If there are any pets around you, be aware of where they are.  Review your medicines with your doctor. Some medicines can make you feel dizzy. This can increase your chance of falling. Ask your doctor what other things that you can do to help prevent falls. This information is not intended to replace advice given to you by your health care provider. Make sure you discuss any questions you have with your health care provider. Document Released: 06/02/2009 Document Revised: 01/12/2016 Document Reviewed: 09/10/2014 Elsevier Interactive Patient Education  2017 [employer].

## 2020-08-04 ENCOUNTER — Ambulatory Visit: Payer: Medicare Other | Attending: Internal Medicine

## 2020-08-04 DIAGNOSIS — Z23 Encounter for immunization: Secondary | ICD-10-CM

## 2020-08-04 NOTE — Progress Notes (Signed)
   Covid-[facility] Vaccination [facility]  Name:  Julia Stevens    MRN: [id number] DOB: 07-01-1946  08/04/2020  [name] was observed post Covid-19 immunization for 15 minutes without incident. She was provided with Vaccine Information Sheet and instruction to access the V-Safe system.   [name] was instructed to call 911 with any severe reactions post vaccine: Marland Kitchen Difficulty breathing  . Swelling of face and throat  . A fast heartbeat  . A bad rash all over body  . Dizziness and weakness   Immunizations Administered    Name Date Dose VIS Date Route   Moderna Covid-19 Booster Vaccine 08/04/2020  3:37 PM 0.25 mL 06/08/2020 Intramuscular   Manufacturer: Moderna   Lot: [id number]   [place]: [id number]

## 2020-08-24 ENCOUNTER — Other Ambulatory Visit: Payer: Self-pay | Admitting: Family Medicine

## 2020-09-23 ENCOUNTER — Other Ambulatory Visit: Payer: Self-pay | Admitting: Family Medicine

## 2020-09-26 ENCOUNTER — Other Ambulatory Visit: Payer: Self-pay

## 2020-09-26 ENCOUNTER — Encounter: Payer: Self-pay | Admitting: Family Medicine

## 2020-09-26 ENCOUNTER — Telehealth (INDEPENDENT_AMBULATORY_CARE_PROVIDER_SITE_OTHER): Payer: Medicare Other | Admitting: Family Medicine

## 2020-09-26 VITALS — Ht 61.0 in | Wt 177.0 lb

## 2020-09-26 DIAGNOSIS — E785 Hyperlipidemia, unspecified: Secondary | ICD-10-CM | POA: Diagnosis not present

## 2020-09-26 DIAGNOSIS — F29 Unspecified psychosis not due to a substance or known physiological condition: Secondary | ICD-10-CM

## 2020-09-26 DIAGNOSIS — Z1329 Encounter for screening for other suspected endocrine disorder: Secondary | ICD-10-CM

## 2020-09-26 DIAGNOSIS — E559 Vitamin D deficiency, unspecified: Secondary | ICD-10-CM

## 2020-09-26 DIAGNOSIS — E669 Obesity, unspecified: Secondary | ICD-10-CM

## 2020-09-26 DIAGNOSIS — I10 Essential (primary) hypertension: Secondary | ICD-10-CM

## 2020-09-26 DIAGNOSIS — Z1231 Encounter for screening mammogram for malignant neoplasm of breast: Secondary | ICD-10-CM

## 2020-09-26 DIAGNOSIS — J309 Allergic rhinitis, unspecified: Secondary | ICD-10-CM

## 2020-09-26 MED ORDER — NORTRIPTYLINE HCL 50 MG PO CAPS: ORAL_CAPSULE | ORAL | 11 refills | Status: DC

## 2020-09-26 MED ORDER — DILTIAZEM HCL ER COATED BEADS 300 MG PO CP24
300.0000 mg | ORAL_CAPSULE | Freq: Every day | ORAL | 3 refills | Status: DC
Start: 2020-09-26 — End: 2021-04-05

## 2020-09-26 NOTE — Patient Instructions (Addendum)
F/U in 6  Months , call if you need me before  No medication changes , pLease get fasting labs in next 1 to 2 weeks, lipid, cmp and EGFr, TSH  And vit D in next 1 to 2 weeks  Please schedule afternoon mammogram at checkout  It is important that you exercise regularly at least 30 minutes 5 times a week. If you develop chest pain, have severe difficulty breathing, or feel very tired, stop exercising immediately and seek medical attention   Think about what you will eat, plan ahead. Choose " clean, green, fresh or frozen" over canned, processed or packaged foods which are more sugary, salty and fatty. 70 to 75% of food eaten should be vegetables and fruit. Three meals at set times with snacks allowed between meals, but they must be fruit or vegetables. Aim to eat over a 12 hour period , example 7 am to 7 pm, and STOP after  your last meal of the day. Drink water,generally about 64 ounces per day, no other drink is as healthy. Fruit juice is best enjoyed in a healthy way, by EATING the fruit.  Thanks for choosing [facility], we consider it a privelige to serve you.

## 2020-10-02 NOTE — Progress Notes (Signed)
Virtual Visit via Telephone Note  I connected with Julia Stevens on 10/02/20 at  9:40 AM EST by telephone and verified that I am speaking with the correct person using two identifiers.  Location: Patient: home Provider: office   I discussed the limitations, risks, security and privacy concerns of performing an evaluation and management service by telephone and the availability of in person appointments. I also discussed with the patient that there may be a patient responsible charge related to this service. The patient expressed understanding and agreed to proceed.   History of Present Illness: Reports doing well, with no current concerns F/U chronic problems and address any new or current concerns. Review and update medications and allergies. Review recent lab and radiologic data . Update routine health maintainace. Review an encourage improved health habits to include nutrition, exercise and  sleep .  Denies recent fever or chills. Denies sinus pressure, nasal congestion, ear pain or sore throat. Denies chest congestion, productive cough or wheezing. Denies chest pains, palpitations and leg swelling Denies abdominal pain, nausea, vomiting,diarrhea or constipation.   Denies dysuria, frequency, hesitancy or incontinence. Chronic disabling  joint pain, swelling and limitation in mobility. Denies headaches, seizures, numbness, or tingling. Denies depression, anxiety or uncontrolled  insomnia. Denies current flare of dry skin or rash       Observations/Objective: Ht 5\' 1"  (1.549 m)   Wt 177 lb (80.3 kg)   BMI 33.44 kg/m  Good communication with no confusion and intact memory. Alert and oriented x 3 No signs of respiratory distress during speech    Assessment and Plan: HTN, goal below 140/80 DASH diet and commitment to daily physical activity for a minimum of 30 minutes discussed and encouraged, as a part of hypertension management. The importance of attaining a healthy  weight is also discussed.  BP/Weight 09/26/2020 07/21/2020 04/26/2020 03/25/2020 03/25/2020 09/22/2019 5/40/9811  Systolic BP - 914 782 956 213 - 086  Diastolic BP - 66 75 72 86 - 70  Wt. (Lbs) 177 181.12 177.12 167.55 - 169 172  BMI 33.44 34.22 33.47 32.72 - 32.46 33.04   Continue current medication based on history    Hyperlipidemia LDL goal <100 Hyperlipidemia:Low fat diet discussed and encouraged.   Lipid Panel  Lab Results  Component Value Date   CHOL 129 08/26/2019   HDL 43 (L) 08/26/2019   LDLCALC 67 08/26/2019   TRIG 108 08/26/2019   CHOLHDL 3.0 08/26/2019   Updated lab needed at/ before next visit.    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  The importance of healthy food choices with portion control discussed, as well as eating regularly and within a 12 hour window most days. The need to choose "clean , green" food 50 to 75% of the time is discussed, as well as to make water the primary drink and set a goal of 64 ounces water daily.    Weight /BMI 09/26/2020 07/21/2020 04/26/2020  WEIGHT 177 lb 181 lb 1.9 oz 177 lb 1.9 oz  HEIGHT 5\' 1"  5\' 1"  5\' 1"   BMI 33.44 kg/m2 34.22 kg/m2 33.47 kg/m2      Allergic rhinitis Controlled, no change in medication     Follow Up Instructions:    I discussed the assessment and treatment plan with the patient. The patient was provided an opportunity to ask questions and all  were answered. The patient agreed with the plan and demonstrated an understanding of the instructions.   The patient was advised to call back or seek an in-person evaluation if the symptoms worsen or if the condition fails to improve as anticipated.  I provided  15 minutes of non-face-to-face time during this encounter.   Tula Nakayama, MD

## 2020-10-02 NOTE — Assessment & Plan Note (Signed)
  Patient re-educated about  the importance of commitment to a  minimum of 150 minutes of exercise per week as able.  The importance of healthy food choices with portion control discussed, as well as eating regularly and within a 12 hour window most days. The need to choose "clean , green" food 50 to 75% of the time is discussed, as well as to make water the primary drink and set a goal of 64 ounces water daily.    Weight /BMI 09/26/2020 07/21/2020 04/26/2020  WEIGHT 177 lb 181 lb 1.9 oz 177 lb 1.9 oz  HEIGHT 5\' 1"  5\' 1"  5\' 1"   BMI 33.44 kg/m2 34.22 kg/m2 33.47 kg/m2

## 2020-10-02 NOTE — Assessment & Plan Note (Signed)
Hyperlipidemia:Low fat diet discussed and encouraged.   Lipid Panel  Lab Results  Component Value Date   CHOL 129 08/26/2019   HDL 43 (L) 08/26/2019   LDLCALC 67 08/26/2019   TRIG 108 08/26/2019   CHOLHDL 3.0 08/26/2019   Updated lab needed at/ before next visit.

## 2020-10-02 NOTE — Assessment & Plan Note (Signed)
DASH diet and commitment to daily physical activity for a minimum of 30 minutes discussed and encouraged, as a part of hypertension management. The importance of attaining a healthy weight is also discussed.  BP/Weight 09/26/2020 07/21/2020 04/26/2020 03/25/2020 03/25/2020 09/22/2019 8/86/7737  Systolic BP - 366 815 947 076 - 151  Diastolic BP - 66 75 72 86 - 70  Wt. (Lbs) 177 181.12 177.12 167.55 - 169 172  BMI 33.44 34.22 33.47 32.72 - 32.46 33.04   Continue current medication based on history

## 2020-10-02 NOTE — Assessment & Plan Note (Signed)
Controlled on current medication, continue same 

## 2020-10-02 NOTE — Assessment & Plan Note (Signed)
Controlled, no change in medication  

## 2020-10-03 DIAGNOSIS — I739 Peripheral vascular disease, unspecified: Secondary | ICD-10-CM | POA: Diagnosis not present

## 2020-10-03 DIAGNOSIS — M79674 Pain in right toe(s): Secondary | ICD-10-CM | POA: Diagnosis not present

## 2020-10-03 DIAGNOSIS — B351 Tinea unguium: Secondary | ICD-10-CM | POA: Diagnosis not present

## 2020-10-03 DIAGNOSIS — M7740 Metatarsalgia, unspecified foot: Secondary | ICD-10-CM | POA: Diagnosis not present

## 2020-10-03 DIAGNOSIS — L851 Acquired keratosis [keratoderma] palmaris et plantaris: Secondary | ICD-10-CM | POA: Diagnosis not present

## 2020-10-14 ENCOUNTER — Ambulatory Visit (HOSPITAL_COMMUNITY): Payer: Medicare Other

## 2020-10-21 ENCOUNTER — Inpatient Hospital Stay (HOSPITAL_COMMUNITY): Admission: RE | Admit: 2020-10-21 | Payer: Medicare Other | Source: Ambulatory Visit

## 2020-10-28 ENCOUNTER — Ambulatory Visit (HOSPITAL_COMMUNITY)
Admission: RE | Admit: 2020-10-28 | Discharge: 2020-10-28 | Disposition: A | Discharge status: Home / Self Care | Payer: Medicare Other | Source: Ambulatory Visit | Attending: Family Medicine | Admitting: Family Medicine

## 2020-10-28 ENCOUNTER — Other Ambulatory Visit: Payer: Self-pay

## 2020-10-28 DIAGNOSIS — Z1231 Encounter for screening mammogram for malignant neoplasm of breast: Secondary | ICD-10-CM | POA: Diagnosis present

## 2020-11-23 ENCOUNTER — Other Ambulatory Visit: Payer: Self-pay | Admitting: Family Medicine

## 2020-11-23 DIAGNOSIS — L299 Pruritus, unspecified: Secondary | ICD-10-CM

## 2020-11-28 ENCOUNTER — Encounter (HOSPITAL_COMMUNITY): Payer: Self-pay | Admitting: Emergency Medicine

## 2020-11-28 ENCOUNTER — Other Ambulatory Visit: Payer: Self-pay

## 2020-11-28 DIAGNOSIS — I10 Essential (primary) hypertension: Secondary | ICD-10-CM | POA: Diagnosis not present

## 2020-11-28 DIAGNOSIS — Y9301 Activity, walking, marching and hiking: Secondary | ICD-10-CM | POA: Insufficient documentation

## 2020-11-28 DIAGNOSIS — M25511 Pain in right shoulder: Secondary | ICD-10-CM | POA: Insufficient documentation

## 2020-11-28 DIAGNOSIS — W1839XA Other fall on same level, initial encounter: Secondary | ICD-10-CM | POA: Insufficient documentation

## 2020-11-28 DIAGNOSIS — Z79899 Other long term (current) drug therapy: Secondary | ICD-10-CM | POA: Diagnosis not present

## 2020-11-28 NOTE — ED Triage Notes (Signed)
Pt states she was walking and started leaning and could not catch herself. Pt c/o right side head pain, right shoulder pain and left thigh pain. Pt denies any loc.

## 2020-11-29 ENCOUNTER — Encounter (HOSPITAL_COMMUNITY): Payer: Self-pay | Admitting: Radiology

## 2020-11-29 ENCOUNTER — Other Ambulatory Visit: Payer: Self-pay

## 2020-11-29 ENCOUNTER — Emergency Department (HOSPITAL_COMMUNITY)
Admission: EM | Admit: 2020-11-29 | Discharge: 2020-11-29 | Disposition: A | Discharge status: Home / Self Care | Payer: Medicare Other | Attending: Emergency Medicine | Admitting: Emergency Medicine

## 2020-11-29 ENCOUNTER — Emergency Department (HOSPITAL_COMMUNITY): Payer: Medicare Other

## 2020-11-29 DIAGNOSIS — M25511 Pain in right shoulder: Secondary | ICD-10-CM | POA: Diagnosis not present

## 2020-11-29 DIAGNOSIS — W19XXXA Unspecified fall, initial encounter: Secondary | ICD-10-CM

## 2020-11-29 LAB — CBC WITH DIFFERENTIAL/PLATELET
Abs Immature Granulocytes: 0.01 10*3/uL (ref 0.00–0.07)
Basophils Absolute: 0 10*3/uL (ref 0.0–0.1)
Basophils Relative: 0 %
Eosinophils Absolute: 0.1 10*3/uL (ref 0.0–0.5)
Eosinophils Relative: 3 %
HCT: 39.5 % (ref 36.0–46.0)
Hemoglobin: 12.9 g/dL (ref 12.0–15.0)
Immature Granulocytes: 0 %
Lymphocytes Relative: 34 %
Lymphs Abs: 1.6 10*3/uL (ref 0.7–4.0)
MCH: 29.8 pg (ref 26.0–34.0)
MCHC: 32.7 g/dL (ref 30.0–36.0)
MCV: 91.2 fL (ref 80.0–100.0)
Monocytes Absolute: 0.5 10*3/uL (ref 0.1–1.0)
Monocytes Relative: 10 %
Neutro Abs: 2.5 10*3/uL (ref 1.7–7.7)
Neutrophils Relative %: 53 %
Platelets: 152 10*3/uL (ref 150–400)
RBC: 4.33 MIL/uL (ref 3.87–5.11)
RDW: 13.8 % (ref 11.5–15.5)
WBC: 4.7 10*3/uL (ref 4.0–10.5)
nRBC: 0 % (ref 0.0–0.2)

## 2020-11-29 LAB — BASIC METABOLIC PANEL
Anion gap: 9 (ref 5–15)
BUN: 19 mg/dL (ref 8–23)
CO2: 24 mmol/L (ref 22–32)
Calcium: 8.7 mg/dL — ABNORMAL LOW (ref 8.9–10.3)
Chloride: 105 mmol/L (ref 98–111)
Creatinine, Ser: 1.34 mg/dL — ABNORMAL HIGH (ref 0.44–1.00)
GFR, Estimated: 42 mL/min — ABNORMAL LOW (ref >60)
Glucose, Bld: 105 mg/dL — ABNORMAL HIGH (ref 70–99)
Potassium: 4.9 mmol/L (ref 3.5–5.1)
Sodium: 138 mmol/L (ref 135–145)

## 2020-11-29 NOTE — ED Notes (Signed)
Pt wheeled out to lobby; family to pick up

## 2020-11-29 NOTE — ED Provider Notes (Signed)
[facility] EMERGENCY DEPARTMENT Provider Note   CSN: [id number] Arrival date & time: 11/28/20  2241     History Chief Complaint  Patient presents with  . Fall    Julia Stevens is a 75 y.o. female.  [place] a couple days ago. Tried to grab the refrigerator handle to stop her fall. Now has shoulder pain.    Fall This is a new problem. The current episode started more than 2 days ago. The problem occurs constantly. The problem has been resolved. Pertinent negatives include no chest pain, no abdominal pain, no headaches and no shortness of breath. Nothing aggravates the symptoms. Nothing relieves the symptoms. She has tried nothing for the symptoms. The treatment provided no relief.       Past Medical History:  Diagnosis Date  . Arthritis   . Dysphagia   . Eczema   . Hyperlipidemia   . Hypertension   . Psychosis ([place])   . Scleroderma ([place])   . Shingles     Patient Active Problem List   Diagnosis Date Noted  . RAS (renal artery stenosis) ([place]) 07/21/2020  . Double vision 04/26/2020  . Left leg swelling 04/26/2020  . Chronic hip pain, left 08/26/2019  . Obesity (BMI 30.0-34.9) 01/17/2018  . Polyarthritis 07/05/2015  . Pruritus 07/05/2015  . Hyperlipidemia LDL goal <100 09/01/2014  . Neck pain on left side 07/23/2011  . Allergic rhinitis 04/29/2009  . Rheumatoid arthritis of hand ([place]) 06/03/2008  . Nonorganic psychosis ([place]) 12/26/2007  . HTN, goal below 140/80 12/26/2007  . Eczema of both hands 12/26/2007    Past Surgical History:  Procedure Laterality Date  . COLONOSCOPY  06/26/2000   [name],   . COLONOSCOPY N/A 12/25/2013   Procedure: COLONOSCOPY;  Surgeon: Danie Binder, MD;  Location: [facility] ENDO SUITE;  Service: Endoscopy;  Laterality: N/A;  10:45AM  . TOTAL ABDOMINAL HYSTERECTOMY W/ BILATERAL SALPINGOOPHORECTOMY  1994     OB History   No obstetric history on file.     Family History  Problem Relation Age of Onset  . Hypertension Mother   . Heart  disease Mother        CAD  . Hypertension Father         Severe DJD/ macrobacterium  . Hypertension Sister   . Hypertension Sister   . Fibromyalgia Sister   . Diabetes Brother   . Colon cancer Neg Hx     Social History   Tobacco Use  . Smoking status: Never Smoker  . Smokeless tobacco: Never Used  Vaping Use  . Vaping Use: Never used  Substance Use Topics  . Alcohol use: No  . Drug use: No    Home Medications Prior to Admission medications   Medication Sig Start Date End Date Taking? Authorizing Provider  calcium carbonate (OS-CAL) 600 MG TABS tablet Take 600 mg by mouth 2 (two) times daily with a meal.    [provider]  cetirizine (ZYRTEC) 10 MG tablet Take 1 tablet (10 mg total) by mouth daily. 07/31/20   Fayrene Helper, MD  diltiazem (CARDIZEM CD) 300 MG 24 hr capsule TAKE 1 CAPSULE BY MOUTH DAILY. 09/26/20   Fayrene Helper, MD  diltiazem (CARDIZEM CD) 300 MG 24 hr capsule Take 1 capsule (300 mg total) by mouth daily. 09/26/20   Fayrene Helper, MD  fluconazole (DIFLUCAN) 100 MG tablet Take 1 tablet (100 mg total) by mouth daily. 07/21/20   Fayrene Helper, MD  hydrochlorothiazide (HYDRODIURIL) 25 MG tablet  TAKE ONE TABLET BY MOUTH DAILY. 07/25/20   Fayrene Helper, MD  hydrOXYzine (ATARAX/VISTARIL) 10 MG tablet TAKE (1) TABLET BY MOUTH AT BEDTIME FOR SLEEP AND ITCHING. 11/23/20   Fayrene Helper, MD  ibuprofen (ADVIL) 200 MG tablet Take 600 mg by mouth every 6 (six) hours as needed for headache.    [provider]  Multiple Vitamin (MULTIVITAMIN) capsule Take 1 capsule by mouth daily.    [provider]  nortriptyline (PAMELOR) 50 MG capsule Take two capsules by mouth at bedtime 09/26/20   Fayrene Helper, MD  potassium chloride (KLOR-CON) 10 MEQ tablet TAKE ONE TABLET BY MOUTH TWICE DAILY. 06/22/20   Fayrene Helper, MD  rosuvastatin (CRESTOR) 10 MG tablet Take 1 tablet (10 mg total) by mouth daily. 07/21/20   Fayrene Helper, MD  terbinafine (LAMISIL) 250 MG tablet Take 1 tablet (250 mg total) by mouth daily. 07/21/20   Fayrene Helper, MD    Allergies    Ace inhibitors, Ciprofloxacin, and Penicillins  Review of Systems   Review of Systems  Respiratory: Negative for shortness of breath.   Cardiovascular: Negative for chest pain.  Gastrointestinal: Negative for abdominal pain.  Neurological: Negative for headaches.  All other systems reviewed and are negative.   Physical Exam Updated Vital Signs BP (!) 149/79   Pulse 62   Temp 98.3 F (36.8 C) (Oral)   Resp 15   Ht 5\' 1"  (1.549 m)   Wt 80.3 kg   SpO2 99%   BMI 33.44 kg/m   Physical Exam Vitals and nursing note reviewed.  Constitutional:      Appearance: She is well-developed.  HENT:     Head: Normocephalic and atraumatic.     Mouth/Throat:     Mouth: Mucous membranes are moist.     Pharynx: Oropharynx is clear.  Eyes:     Pupils: Pupils are equal, round, and reactive to light.  Cardiovascular:     Rate and Rhythm: Normal rate and regular rhythm.  Pulmonary:     Effort: No respiratory distress.     Breath sounds: No stridor.  Abdominal:     General: Abdomen is flat. There is no distension.  Musculoskeletal:        General: Tenderness (to right shoulder, worse with active ROM.) present. No deformity or signs of injury.     Cervical back: Normal range of motion.  Skin:    General: Skin is warm and dry.  Neurological:     General: No focal deficit present.     Mental Status: She is alert.  Psychiatric:        Mood and Affect: Mood normal.     ED Results / Procedures / Treatments   Labs (all labs ordered are listed, but only abnormal results are displayed) Labs Reviewed  BASIC METABOLIC PANEL - Abnormal; Notable for the following components:      Result Value   Glucose, Bld 105 (*)    Creatinine, Ser 1.34 (*)    Calcium 8.7 (*)    GFR, Estimated 42 (*)    All other components within normal limits  CBC WITH  DIFFERENTIAL/PLATELET    EKG None  Radiology DG Shoulder Right  Result Date: 11/29/2020 CLINICAL DATA:  Pain after fall EXAM: RIGHT SHOULDER - 2+ VIEW COMPARISON:  None. FINDINGS: No fracture or malalignment. AC joint upper limits of normal. Right lung apex is clear IMPRESSION: No acute osseous abnormality. Electronically Signed   By: Maudie Mercury  Francoise Ceo M.D.   On: 11/29/2020 00:21    Procedures Procedures   Medications Ordered in ED Medications - No data to display  ED Course  I have reviewed the triage vital signs and the nursing notes.  Pertinent labs & imaging results that were available during my care of the patient were reviewed by me and considered in my medical decision making (see chart for details).    MDM Rules/Calculators/A&P                          Likely rotator cuff injury. No shoulder injury on xr. Workup without evidence of anemia, dehydration, electrolyte abnormalities or concern for arrhythmias.   Final Clinical Impression(s) / ED Diagnoses Final diagnoses:  Fall, initial encounter    Rx / DC Orders ED Discharge Orders    None       Anchor Dwan, Corene Cornea, MD 11/29/20 [phone]

## 2020-12-12 DIAGNOSIS — L851 Acquired keratosis [keratoderma] palmaris et plantaris: Secondary | ICD-10-CM | POA: Diagnosis not present

## 2020-12-12 DIAGNOSIS — M7740 Metatarsalgia, unspecified foot: Secondary | ICD-10-CM | POA: Diagnosis not present

## 2020-12-12 DIAGNOSIS — I739 Peripheral vascular disease, unspecified: Secondary | ICD-10-CM | POA: Diagnosis not present

## 2020-12-12 DIAGNOSIS — B351 Tinea unguium: Secondary | ICD-10-CM | POA: Diagnosis not present

## 2020-12-12 DIAGNOSIS — M79674 Pain in right toe(s): Secondary | ICD-10-CM | POA: Diagnosis not present

## 2020-12-20 ENCOUNTER — Ambulatory Visit: Payer: Medicare Other | Admitting: Orthopaedic Surgery

## 2021-01-06 ENCOUNTER — Other Ambulatory Visit: Payer: Self-pay

## 2021-01-06 ENCOUNTER — Encounter: Payer: Self-pay | Admitting: Family Medicine

## 2021-01-06 ENCOUNTER — Ambulatory Visit (INDEPENDENT_AMBULATORY_CARE_PROVIDER_SITE_OTHER): Payer: Medicare Other | Admitting: Family Medicine

## 2021-01-06 ENCOUNTER — Telehealth: Payer: Self-pay

## 2021-01-06 VITALS — BP 130/80 | HR 83 | Resp 16 | Ht 61.0 in | Wt 177.0 lb

## 2021-01-06 DIAGNOSIS — E785 Hyperlipidemia, unspecified: Secondary | ICD-10-CM | POA: Diagnosis not present

## 2021-01-06 DIAGNOSIS — R21 Rash and other nonspecific skin eruption: Secondary | ICD-10-CM

## 2021-01-06 DIAGNOSIS — Z1329 Encounter for screening for other suspected endocrine disorder: Secondary | ICD-10-CM | POA: Diagnosis not present

## 2021-01-06 DIAGNOSIS — E669 Obesity, unspecified: Secondary | ICD-10-CM

## 2021-01-06 DIAGNOSIS — F29 Unspecified psychosis not due to a substance or known physiological condition: Secondary | ICD-10-CM | POA: Diagnosis not present

## 2021-01-06 DIAGNOSIS — E559 Vitamin D deficiency, unspecified: Secondary | ICD-10-CM | POA: Diagnosis not present

## 2021-01-06 DIAGNOSIS — E66811 Obesity, class 1: Secondary | ICD-10-CM

## 2021-01-06 DIAGNOSIS — I10 Essential (primary) hypertension: Secondary | ICD-10-CM | POA: Diagnosis not present

## 2021-01-06 MED ORDER — PREDNISONE 5 MG PO TABS: 5.0000 mg | ORAL_TABLET | Freq: Two times a day (BID) | ORAL | 0 refills | Status: AC

## 2021-01-06 NOTE — Telephone Encounter (Signed)
error 

## 2021-01-06 NOTE — Patient Instructions (Signed)
F/U  Early Septemebr, call if you need me before  1 week course of prednisone prescribed.for itch and rash  Labs today ordered already in Feb, 2022  Stop hydroxyzine since not helping  You are referred to Dermatology  Use vaseline on skin for dryness , this will help the itch  Continue home PT for right shoulder  Careful not to fall  Thanks for choosing [facility], we consider it a privelige to serve you.

## 2021-01-07 ENCOUNTER — Encounter: Payer: Self-pay | Admitting: Family Medicine

## 2021-01-07 LAB — LIPID PANEL
Chol/HDL Ratio: 3.4 ratio (ref 0.0–4.4)
Cholesterol, Total: 116 mg/dL (ref 100–199)
HDL: 34 mg/dL — ABNORMAL LOW (ref >39)
LDL Chol Calc (NIH): 59 mg/dL (ref 0–99)
Triglycerides: 132 mg/dL (ref 0–149)
VLDL Cholesterol Cal: 23 mg/dL (ref 5–40)

## 2021-01-07 LAB — CMP14+EGFR
ALT: 17 IU/L (ref 0–32)
AST: 31 IU/L (ref 0–40)
Albumin/Globulin Ratio: 0.9 — ABNORMAL LOW (ref 1.2–2.2)
Albumin: 3.9 g/dL (ref 3.7–4.7)
Alkaline Phosphatase: 105 IU/L (ref 44–121)
BUN/Creatinine Ratio: 17 (ref 12–28)
BUN: 18 mg/dL (ref 8–27)
Bilirubin Total: 0.4 mg/dL (ref 0.0–1.2)
CO2: 22 mmol/L (ref 20–29)
Calcium: 9.1 mg/dL (ref 8.7–10.3)
Chloride: 100 mmol/L (ref 96–106)
Creatinine, Ser: 1.09 mg/dL — ABNORMAL HIGH (ref 0.57–1.00)
Globulin, Total: 4.2 g/dL (ref 1.5–4.5)
Glucose: 101 mg/dL — ABNORMAL HIGH (ref 65–99)
Potassium: 3.6 mmol/L (ref 3.5–5.2)
Sodium: 142 mmol/L (ref 134–144)
Total Protein: 8.1 g/dL (ref 6.0–8.5)
eGFR: 54 mL/min/{1.73_m2} — ABNORMAL LOW (ref >59)

## 2021-01-07 LAB — TSH: TSH: 3.08 u[IU]/mL (ref 0.450–4.500)

## 2021-01-07 LAB — VITAMIN D 25 HYDROXY (VIT D DEFICIENCY, FRACTURES): Vit D, 25-Hydroxy: 36 ng/mL (ref 30.0–100.0)

## 2021-01-07 NOTE — Progress Notes (Signed)
Julia Stevens     MRN: [id number]      DOB: 07-31-1946   HPI Julia Stevens is here for follow up and re-evaluation of chronic medical conditions, medication management and review of any available recent lab and radiology data.  Preventive health is updated, specifically  Cancer screening and Immunization.   C/O rash which itches on thighs, chest arms and back, no fever or drainage Hydroxyzine and eucerin of no benefit Long h/o severe eczema   ROS Denies recent fever or chills. Denies sinus pressure, nasal congestion, ear pain or sore throat. Denies chest congestion, productive cough or wheezing. Denies chest pains, palpitations and leg swelling Denies abdominal pain, nausea, vomiting,diarrhea or constipation.   Denies dysuria, frequency, hesitancy or incontinence. C/o chronic  joint pain, swelling and limitation in mobility. Denies headaches, seizures, numbness, or tingling. Denies uncontrolled  depression, anxiety or insomnia.    PE  BP 130/80   Pulse 83   Resp 16   Ht 5\' 1"  (1.549 m)   Wt 177 lb (80.3 kg)   SpO2 95%   BMI 33.44 kg/m   Patient alert and oriented and in no cardiopulmonary distress.  HEENT: No facial asymmetry, EOMI,     Neck supple .  Chest: Clear to auscultation bilaterally.  CVS: S1, S2 no murmurs, no S3.Regular rate.  ABD: Soft non tender.   Ext: No edema  MS: decreased  ROM spine, shoulders, hips and knees.Marked deformity of IP joints of all digits  Skin: Intact, hyperpigmented macular  rash noted.in patches over arms, thighs and back  Psych: Good eye contact, normal affect. Memory intact not anxious or depressed appearing.  CNS: CN 2-12 intact, power,  normal throughout.no focal deficits noted.   Assessment & Plan  Rash and nonspecific skin eruption generalized pruritus with scaly dry skin and hyperpigmented rash, short course of prednisone, stop hydroxyzine, refer dermatology, vaseline to rash  Obesity (BMI 30.0-34.9)  Patient  re-educated about  the importance of commitment to a  minimum of 150 minutes of exercise per week as able.  The importance of healthy food choices with portion control discussed, as well as eating regularly and within a 12 hour window most days. The need to choose "clean , green" food 50 to 75% of the time is discussed, as well as to make water the primary drink and set a goal of 64 ounces water daily.    Weight /BMI 01/06/2021 11/29/2020 11/28/2020  WEIGHT 177 lb - 177 lb  HEIGHT 5\' 1"  - 5\' 1"   BMI 33.44 kg/m2 33.44 kg/m2 -      Nonorganic psychosis Stable and acontrolled on current medication continue same  HTN, goal below 140/80 Controlled, no change in medication DASH diet and commitment to daily physical activity for a minimum of 30 minutes discussed and encouraged, as a part of hypertension management. The importance of attaining a healthy weight is also discussed.  BP/Weight 01/06/2021 11/29/2020 11/28/2020 09/26/2020 07/21/2020 04/22/2354 02/19/2201  Systolic BP 542 706 - - 237 628 315  Diastolic BP 80 79 - - 66 75 72  Wt. (Lbs) 177 - 177 177 181.12 177.12 167.55  BMI 33.44 33.44 - 33.44 34.22 33.47 32.72       Hyperlipidemia LDL goal <100 Hyperlipidemia:Low fat diet discussed and encouraged.   Lipid Panel  Lab Results  Component Value Date   CHOL 116 01/06/2021   HDL 34 (L) 01/06/2021   LDLCALC 59 01/06/2021   TRIG 132 01/06/2021   CHOLHDL 3.4 01/06/2021  Controlled, no change in medication

## 2021-01-07 NOTE — Assessment & Plan Note (Signed)
generalized pruritus with scaly dry skin and hyperpigmented rash, short course of prednisone, stop hydroxyzine, refer dermatology, vaseline to rash

## 2021-01-07 NOTE — Assessment & Plan Note (Signed)
Controlled, no change in medication DASH diet and commitment to daily physical activity for a minimum of 30 minutes discussed and encouraged, as a part of hypertension management. The importance of attaining a healthy weight is also discussed.  BP/Weight 01/06/2021 11/29/2020 11/28/2020 09/26/2020 07/21/2020 04/22/4267 10/21/1960  Systolic BP 229 798 - - 921 194 174  Diastolic BP 80 79 - - 66 75 72  Wt. (Lbs) 177 - 177 177 181.12 177.12 167.55  BMI 33.44 33.44 - 33.44 34.22 33.47 32.72

## 2021-01-07 NOTE — Assessment & Plan Note (Signed)
Stable and acontrolled on current medication continue same

## 2021-01-07 NOTE — Assessment & Plan Note (Signed)
Hyperlipidemia:Low fat diet discussed and encouraged.   Lipid Panel  Lab Results  Component Value Date   CHOL 116 01/06/2021   HDL 34 (L) 01/06/2021   LDLCALC 59 01/06/2021   TRIG 132 01/06/2021   CHOLHDL 3.4 01/06/2021     Controlled, no change in medication

## 2021-01-07 NOTE — Assessment & Plan Note (Signed)
  Patient re-educated about  the importance of commitment to a  minimum of 150 minutes of exercise per week as able.  The importance of healthy food choices with portion control discussed, as well as eating regularly and within a 12 hour window most days. The need to choose "clean , green" food 50 to 75% of the time is discussed, as well as to make water the primary drink and set a goal of 64 ounces water daily.    Weight /BMI 01/06/2021 11/29/2020 11/28/2020  WEIGHT 177 lb - 177 lb  HEIGHT 5\' 1"  - 5\' 1"   BMI 33.44 kg/m2 33.44 kg/m2 -

## 2021-01-23 DIAGNOSIS — L309 Dermatitis, unspecified: Secondary | ICD-10-CM | POA: Diagnosis not present

## 2021-01-23 DIAGNOSIS — L853 Xerosis cutis: Secondary | ICD-10-CM | POA: Diagnosis not present

## 2021-01-27 ENCOUNTER — Other Ambulatory Visit: Payer: Self-pay | Admitting: Family Medicine

## 2021-02-27 DIAGNOSIS — M79674 Pain in right toe(s): Secondary | ICD-10-CM | POA: Diagnosis not present

## 2021-02-27 DIAGNOSIS — I739 Peripheral vascular disease, unspecified: Secondary | ICD-10-CM | POA: Diagnosis not present

## 2021-02-27 DIAGNOSIS — B351 Tinea unguium: Secondary | ICD-10-CM | POA: Diagnosis not present

## 2021-02-27 DIAGNOSIS — L851 Acquired keratosis [keratoderma] palmaris et plantaris: Secondary | ICD-10-CM | POA: Diagnosis not present

## 2021-02-27 DIAGNOSIS — M7740 Metatarsalgia, unspecified foot: Secondary | ICD-10-CM | POA: Diagnosis not present

## 2021-03-06 DIAGNOSIS — L2084 Intrinsic (allergic) eczema: Secondary | ICD-10-CM | POA: Diagnosis not present

## 2021-03-09 ENCOUNTER — Other Ambulatory Visit: Payer: Self-pay | Admitting: Family Medicine

## 2021-03-27 ENCOUNTER — Ambulatory Visit: Payer: Medicare Other | Admitting: Family Medicine

## 2021-04-05 ENCOUNTER — Other Ambulatory Visit: Payer: Self-pay | Admitting: Family Medicine

## 2021-04-20 ENCOUNTER — Ambulatory Visit (INDEPENDENT_AMBULATORY_CARE_PROVIDER_SITE_OTHER): Payer: Medicare Other | Admitting: Family Medicine

## 2021-04-20 ENCOUNTER — Encounter: Payer: Self-pay | Admitting: Family Medicine

## 2021-04-20 ENCOUNTER — Other Ambulatory Visit: Payer: Self-pay

## 2021-04-20 VITALS — BP 123/72 | HR 88 | Temp 98.6°F | Resp 20 | Ht 61.0 in | Wt 163.0 lb

## 2021-04-20 DIAGNOSIS — M069 Rheumatoid arthritis, unspecified: Secondary | ICD-10-CM | POA: Diagnosis not present

## 2021-04-20 DIAGNOSIS — E669 Obesity, unspecified: Secondary | ICD-10-CM

## 2021-04-20 DIAGNOSIS — Z23 Encounter for immunization: Secondary | ICD-10-CM

## 2021-04-20 DIAGNOSIS — F29 Unspecified psychosis not due to a substance or known physiological condition: Secondary | ICD-10-CM | POA: Diagnosis not present

## 2021-04-20 DIAGNOSIS — L309 Dermatitis, unspecified: Secondary | ICD-10-CM | POA: Diagnosis not present

## 2021-04-20 DIAGNOSIS — E785 Hyperlipidemia, unspecified: Secondary | ICD-10-CM | POA: Diagnosis not present

## 2021-04-20 DIAGNOSIS — I10 Essential (primary) hypertension: Secondary | ICD-10-CM | POA: Diagnosis not present

## 2021-04-20 NOTE — Patient Instructions (Signed)
Follow-up in mid January call if you need me before.    Flu vaccine today.  Please get fasting lipids CMP and EGFR first week in November.  Thanks for choosing [facility], we consider it a privelige to serve you.

## 2021-04-23 ENCOUNTER — Encounter: Payer: Self-pay | Admitting: Family Medicine

## 2021-04-23 NOTE — Assessment & Plan Note (Signed)
Controlled, no change in medication DASH diet and commitment to daily physical activity for a minimum of 30 minutes discussed and encouraged, as a part of hypertension management. The importance of attaining a healthy weight is also discussed.  BP/Weight 04/20/2021 01/06/2021 11/29/2020 11/28/2020 09/26/2020 [id number]  Systolic BP [id number] 123456 - - 123XX123 123XX123  Diastolic BP 72 80 79 - - 66 75  Wt. (Lbs) 163 177 - 177 177 181.12 177.12  BMI 30.8 33.44 33.44 - 33.44 34.22 33.47

## 2021-04-23 NOTE — Assessment & Plan Note (Signed)
Stable on chronic meds

## 2021-04-23 NOTE — Assessment & Plan Note (Signed)
Improved. Pt applauded on succesful weight loss through lifestyle change, and encouraged to continue same. Weight loss goal set for the next several months.  

## 2021-04-23 NOTE — Assessment & Plan Note (Signed)
Severe needs rheumatology mx however no interest currently

## 2021-04-23 NOTE — Progress Notes (Signed)
   Julia Stevens     MRN: [id number]      DOB: Jan 12, 1946   HPI Julia Stevens is here for follow up and re-evaluation of chronic medical conditions, medication management and review of any available recent lab and radiology data.  Preventive health is updated, specifically  Cancer screening and Immunization.   Questions or concerns regarding consultations or procedures which the PT has had in the interim are  addressed. The PT denies any adverse reactions to current medications since the last visit.  There are no new concerns.  There are no specific complaints   ROS Denies recent fever or chills. Denies sinus pressure, nasal congestion, ear pain or sore throat. Denies chest congestion, productive cough or wheezing. Denies chest pains, palpitations and leg swelling Denies abdominal pain, nausea, vomiting,diarrhea or constipation.   Denies dysuria, frequency, hesitancy or incontinence. Denies joint pain, swelling and limitation in mobility. Denies headaches, seizures, numbness, or tingling. Denies depression, anxiety or insomnia. Denies skin break down or rash.   PE  BP 123/72 (BP Location: Right Arm, Patient Position: Sitting, Cuff Size: Large)   Pulse 88   Temp 98.6 F (37 C)   Resp 20   Ht '5\' 1"'$  (1.549 m)   Wt 163 lb (73.9 kg)   SpO2 96%   BMI 30.80 kg/m   Patient alert and oriented and in no cardiopulmonary distress.  HEENT: No facial asymmetry, EOMI,     Neck supple .  Chest: Clear to auscultation bilaterally.  CVS: S1, S2 no murmurs, no S3.Regular rate.  ABD: Soft non tender.   Ext: No edema  MS: Adequate though reduced  ROM spine, shoulders, hips and knees.  Skin: Intact, hyperpigmented  rash noted.  Psych: Good eye contact, normal affect. Memory intact not anxious or depressed appearing.  CNS: CN 2-12 intact, power,  normal throughout.no focal deficits noted.   Assessment & Plan  HTN, goal below 140/80 Controlled, no change in medication DASH diet  and commitment to daily physical activity for a minimum of 30 minutes discussed and encouraged, as a part of hypertension management. The importance of attaining a healthy weight is also discussed.  BP/Weight 04/20/2021 01/06/2021 11/29/2020 11/28/2020 09/26/2020 [id number]  Systolic BP [id number] 123456 - - 123XX123 [id number]  Diastolic BP 72 80 79 - - 66 75  Wt. (Lbs) 163 177 - 177 177 181.12 177.12  BMI 30.8 33.44 33.44 - 33.44 34.22 33.47       Obesity (BMI 30.0-34.9) Improved. Pt applauded on succesful weight loss through lifestyle change, and encouraged to continue same. Weight loss goal set for the next several months.   Nonorganic psychosis Stable on chronic meds  Hyperlipidemia LDL goal <100 Hyperlipidemia:Low fat diet discussed and encouraged.   Lipid Panel  Lab Results  Component Value Date   CHOL 116 01/06/2021   HDL 34 (L) 01/06/2021   LDLCALC 59 01/06/2021   TRIG 132 01/06/2021   CHOLHDL 3.4 01/06/2021   Updated lab needed at/ before next visit. Needs to inc activity    Eczema of both hands No current flare  Rheumatoid arthritis of hand Severe needs rheumatology mx however no interest currently

## 2021-04-23 NOTE — Assessment & Plan Note (Signed)
Hyperlipidemia:Low fat diet discussed and encouraged.   Lipid Panel  Lab Results  Component Value Date   CHOL 116 01/06/2021   HDL 34 (L) 01/06/2021   LDLCALC 59 01/06/2021   TRIG 132 01/06/2021   CHOLHDL 3.4 01/06/2021   Updated lab needed at/ before next visit. Needs to inc activity

## 2021-04-23 NOTE — Assessment & Plan Note (Signed)
No current flare 

## 2021-04-28 ENCOUNTER — Other Ambulatory Visit: Payer: Self-pay | Admitting: Family Medicine

## 2021-05-19 ENCOUNTER — Telehealth: Payer: Self-pay | Admitting: Family Medicine

## 2021-05-19 NOTE — Telephone Encounter (Signed)
Patient called in with questions about the shingles vaccine. Is not sure if she needs to get another vaccine.

## 2021-05-19 NOTE — Telephone Encounter (Signed)
We don't give the shingles vaccine to medicare patients.  She can get it at her local pharmacy. It is a series of 2 shots a couple months apart

## 2021-05-22 ENCOUNTER — Other Ambulatory Visit: Payer: Self-pay | Admitting: Family Medicine

## 2021-06-29 ENCOUNTER — Other Ambulatory Visit: Payer: Self-pay | Admitting: Family Medicine

## 2021-06-29 DIAGNOSIS — E785 Hyperlipidemia, unspecified: Secondary | ICD-10-CM

## 2021-07-04 DIAGNOSIS — Z20828 Contact with and (suspected) exposure to other viral communicable diseases: Secondary | ICD-10-CM | POA: Diagnosis not present

## 2021-07-17 ENCOUNTER — Other Ambulatory Visit: Payer: Self-pay | Admitting: Family Medicine

## 2021-07-18 DIAGNOSIS — I10 Essential (primary) hypertension: Secondary | ICD-10-CM | POA: Diagnosis not present

## 2021-07-18 DIAGNOSIS — E785 Hyperlipidemia, unspecified: Secondary | ICD-10-CM | POA: Diagnosis not present

## 2021-07-19 LAB — CMP14+EGFR
ALT: 9 IU/L (ref 0–32)
AST: 20 IU/L (ref 0–40)
Albumin/Globulin Ratio: 1.1 — ABNORMAL LOW (ref 1.2–2.2)
Albumin: 4.3 g/dL (ref 3.7–4.7)
Alkaline Phosphatase: 103 IU/L (ref 44–121)
BUN/Creatinine Ratio: 19 (ref 12–28)
BUN: 19 mg/dL (ref 8–27)
Bilirubin Total: 0.3 mg/dL (ref 0.0–1.2)
CO2: 25 mmol/L (ref 20–29)
Calcium: 9.6 mg/dL (ref 8.7–10.3)
Chloride: 102 mmol/L (ref 96–106)
Creatinine, Ser: 1.02 mg/dL — ABNORMAL HIGH (ref 0.57–1.00)
Globulin, Total: 3.8 g/dL (ref 1.5–4.5)
Glucose: 87 mg/dL (ref 70–99)
Potassium: 3.1 mmol/L — ABNORMAL LOW (ref 3.5–5.2)
Sodium: 142 mmol/L (ref 134–144)
Total Protein: 8.1 g/dL (ref 6.0–8.5)
eGFR: 58 mL/min/{1.73_m2} — ABNORMAL LOW (ref >59)

## 2021-07-19 LAB — LIPID PANEL
Chol/HDL Ratio: 3 ratio (ref 0.0–4.4)
Cholesterol, Total: 119 mg/dL (ref 100–199)
HDL: 40 mg/dL (ref >39)
LDL Chol Calc (NIH): 62 mg/dL (ref 0–99)
Triglycerides: 85 mg/dL (ref 0–149)
VLDL Cholesterol Cal: 17 mg/dL (ref 5–40)

## 2021-07-27 DIAGNOSIS — Z20822 Contact with and (suspected) exposure to covid-19: Secondary | ICD-10-CM | POA: Diagnosis not present

## 2021-07-28 DIAGNOSIS — Z20822 Contact with and (suspected) exposure to covid-19: Secondary | ICD-10-CM | POA: Diagnosis not present

## 2021-08-07 ENCOUNTER — Ambulatory Visit (INDEPENDENT_AMBULATORY_CARE_PROVIDER_SITE_OTHER): Payer: Medicare Other

## 2021-08-07 ENCOUNTER — Other Ambulatory Visit: Payer: Self-pay

## 2021-08-07 DIAGNOSIS — Z0000 Encounter for general adult medical examination without abnormal findings: Secondary | ICD-10-CM

## 2021-08-07 DIAGNOSIS — Z136 Encounter for screening for cardiovascular disorders: Secondary | ICD-10-CM

## 2021-08-07 NOTE — Progress Notes (Signed)
Subjective:  I connected with  Julia Stevens on 08/07/21 by a audio enabled telemedicine application and verified that I am speaking with the correct person using two identifiers.  Patient Location: Home  Provider Location: Office/Clinic  I discussed the limitations of evaluation and management by telemedicine. The patient expressed understanding and agreed to proceed.  Julia Stevens is a 75 y.o. female who presents for Medicare Annual (Subsequent) preventive examination.  Review of Systems    Defer to PCP Cardiac Risk Factors include: advanced age (>52men, >8 women)     Objective:    There were no vitals filed for this visit. There is no height or weight on file to calculate BMI.  Advanced Directives 08/07/2021 11/28/2020 08/02/2020 03/25/2020 07/28/2018 07/22/2017 02/15/2017  Does Patient Have a Medical Advance Directive? No No No No No No No  Would patient like information on creating a medical advance directive? No - Patient declined No - Patient declined No - Patient declined No - Patient declined No - Patient declined No - Patient declined No - Patient declined  Pre-existing out of facility DNR order (yellow form or pink MOST form) - - - - - - -    Current Medications (verified) Outpatient Encounter Medications as of 08/07/2021  Medication Sig   calcium carbonate (OS-CAL) 600 MG TABS tablet Take 600 mg by mouth 2 (two) times daily with a meal.   diltiazem (CARDIZEM CD) 300 MG 24 hr capsule TAKE 1 CAPSULE BY MOUTH DAILY.   diltiazem (CARDIZEM CD) 300 MG 24 hr capsule TAKE 1 CAPSULE BY MOUTH DAILY.   hydrochlorothiazide (HYDRODIURIL) 25 MG tablet TAKE ONE TABLET BY MOUTH DAILY.   ibuprofen (ADVIL) 200 MG tablet Take 600 mg by mouth every 6 (six) hours as needed for headache.   Multiple Vitamin (MULTIVITAMIN) capsule Take 1 capsule by mouth daily.   nortriptyline (PAMELOR) 50 MG capsule Take two capsules by mouth at bedtime   potassium chloride (KLOR-CON) 10 MEQ tablet TAKE  ONE TABLET BY MOUTH TWICE DAILY.   rosuvastatin (CRESTOR) 10 MG tablet TAKE ONE TABLET BY MOUTH ONCE DAILY.   cetirizine (ZYRTEC) 10 MG tablet Take 1 tablet (10 mg total) by mouth daily. (Patient not taking: Reported on 08/07/2021)   No facility-administered encounter medications on file as of 08/07/2021.    Allergies (verified) Ace inhibitors, Ciprofloxacin, and Penicillins   History: Past Medical History:  Diagnosis Date   Arthritis    Dysphagia    Eczema    Hyperlipidemia    Hypertension    Psychosis ([place])    Scleroderma ([place])    Shingles    Past Surgical History:  Procedure Laterality Date   COLONOSCOPY  06/26/2000   [name],    COLONOSCOPY N/A 12/25/2013   Procedure: COLONOSCOPY;  Surgeon: Danie Binder, MD;  Location: [facility];  Service: Endoscopy;  Laterality: N/A;  10:45AM   TOTAL ABDOMINAL HYSTERECTOMY W/ BILATERAL SALPINGOOPHORECTOMY  1994   Family History  Problem Relation Age of Onset   Hypertension Mother    Heart disease Mother        CAD   Hypertension Father         Severe DJD/ macrobacterium   Hypertension Sister    Hypertension Sister    Fibromyalgia Sister    Diabetes Brother    Heart disease Brother    Colon cancer Neg Hx    Social History   Socioeconomic History   Marital status: Single    Spouse name: Not on file  Number of children: 0   Years of education: 12   Highest education level: 12th grade  Occupational History   Occupation: disabled     Employer: UNEMPLOYED  Tobacco Use   Smoking status: Never   Smokeless tobacco: Never  Vaping Use   Vaping Use: Never used  Substance and Sexual Activity   Alcohol use: No   Drug use: No   Sexual activity: Not Currently    Birth control/protection: Surgical  Other Topics Concern   Not on file  Social History Narrative   Lives alone in apartment    Social Determinants of Health   Financial Resource Strain: Low Risk    Difficulty of Paying Living Expenses: Not hard at all  Food  Insecurity: No Food Insecurity   Worried About Charity fundraiser in the Last Year: Never true   [place] in the Last Year: Never true  Transportation Needs: No Transportation Needs   Lack of Transportation (Medical): No   Lack of Transportation (Non-Medical): No  Physical Activity: Insufficiently Active   Days of Exercise per Week: 4 days   Minutes of Exercise per Session: 10 min  Stress: No Stress Concern Present   Feeling of Stress : Not at all  Social Connections: Socially Isolated   Frequency of Communication with Friends and Family: More than three times a week   Frequency of Social Gatherings with Friends and Family: Once a week   Attends Religious Services: Never   Marine scientist or Organizations: No   Attends Music therapist: Never   Marital Status: Never married    Tobacco Counseling Counseling given: Not Answered   Clinical Intake:     Pain : No/denies pain     Nutritional Risks: None Diabetes: No  How often do you need to have someone help you when you read instructions, pamphlets, or other written materials from your doctor or pharmacy?: 1 - Never What is the last grade level you completed in school?: 12th  Diabetic?no  Interpreter Needed?: No  Information entered by :: [place] of Daily Living In your present state of health, do you have any difficulty performing the following activities: 08/07/2021  Hearing? N  Vision? N  Difficulty concentrating or making decisions? N  Walking or climbing stairs? Y  Dressing or bathing? N  Doing errands, shopping? Y  Preparing Food and eating ? N  Using the Toilet? N  In the past six months, have you accidently leaked urine? N  Do you have problems with loss of bowel control? N  Managing your Medications? N  Managing your Finances? N  Housekeeping or managing your Housekeeping? N  Some recent data might be hidden    Patient Care Team: Fayrene Helper, MD as  PCP - [facility], Marga Melnick, MD (Inactive) as Consulting Physician (Gastroenterology)  Indicate any recent Medical Services you may have received from other than [facility] providers in the past year (date may be approximate).     Assessment:   This is a routine wellness examination for Julia Stevens.  Hearing/Vision screen No results found.  Dietary issues and exercise activities discussed: Current Exercise Habits: The patient does not participate in regular exercise at present, Exercise limited by: orthopedic condition(s)   Goals Addressed             This Visit's Progress    DIET - DECREASE SODA OR JUICE INTAKE   On track    DIET - INCREASE  WATER INTAKE   On track     Depression Screen PHQ 2/9 Scores 08/07/2021 04/20/2021 01/06/2021 09/26/2020 08/02/2020 08/02/2020 07/21/2020  PHQ - 2 Score 0 0 0 0 0 0 0  PHQ- 9 Score - - - - - - -    Fall Risk Fall Risk  08/07/2021 04/20/2021 01/06/2021 09/26/2020 08/02/2020  Falls in the past year? 1 0 1 1 0  Number falls in past yr: 0 0 0 0 0  Injury with Fall? 1 0 0 0 0  Risk for fall due to : Impaired balance/gait No Fall Risks - No Fall Risks No Fall Risks  Follow up Falls prevention discussed Falls evaluation completed - Falls evaluation completed Falls evaluation completed    [place]:  Any stairs in or around the home? No  If so, are there any without handrails?  N/a Home free of loose throw rugs in walkways, pet beds, electrical cords, etc? No  Adequate lighting in your home to reduce risk of falls? Yes   ASSISTIVE DEVICES UTILIZED TO PREVENT FALLS:  Life alert? No  Use of a cane, walker or w/c? No  Grab bars in the bathroom? Yes  Shower chair or bench in shower?  Pt takes a bath not a shower but no chair or bench Elevated toilet seat or a handicapped toilet? No     Cognitive Function:     6CIT Screen 08/07/2021 08/02/2020 07/30/2019 07/28/2018 07/22/2017  What Year? 0 points 0 points 0 points 0  points 0 points  What month? 0 points 0 points 0 points 0 points 0 points  What time? 0 points 0 points 0 points 0 points 0 points  Count back from 20 0 points 4 points 0 points 0 points 0 points  Months in reverse 4 points 4 points 4 points 4 points 4 points  Repeat phrase 4 points 10 points 6 points 0 points 2 points  Total Score 8 18 10 4 6     Immunizations Immunization History  Administered Date(s) Administered   19-influenza Whole 07/23/2011   Fluad Quad(high Dose 65+) 07/13/2019, 04/26/2020, 04/20/2021   Influenza Split 07/28/2012, 06/22/2014   Influenza Whole 07/04/2007, 06/29/2009, 07/10/2010   Influenza,inj,Quad PF,6+ Mos 05/20/2013, 07/05/2015, 07/20/2016, 07/22/2017, 05/22/2018   Moderna SARS-COV2 Booster Vaccination 08/04/2020   Moderna Sars-Covid-2 Vaccination 10/14/2019, 11/11/2019   Pneumococcal Conjugate-13 03/01/2014   Pneumococcal Polysaccharide-23 08/21/2013   Td 01/04/2004   Tdap 07/23/2011   Zoster Recombinat (Shingrix) 06/18/2021   Zoster, Live 09/22/2013    TDAP status: Due, Education has been provided regarding the importance of this vaccine. Advised may receive this vaccine at local pharmacy or Health Dept. Aware to provide a copy of the vaccination record if obtained from local pharmacy or Health Dept. Verbalized acceptance and understanding.  Flu Vaccine status: Up to date  Pneumococcal vaccine status: Up to date  Covid-19 vaccine status: Information provided on how to obtain vaccines.   Qualifies for Shingles Vaccine? Yes   Zostavax completed No   Shingrix Completed?: No.    Education has been provided regarding the importance of this vaccine. Patient has been advised to call insurance company to determine out of pocket expense if they have not yet received this vaccine. Advised may also receive vaccine at local pharmacy or Health Dept. Verbalized acceptance and understanding.  Screening Tests Health Maintenance  Topic Date Due   COVID-19 Vaccine  (3 - Booster for Moderna series) 09/29/2020   TETANUS/TDAP  07/22/2021  Zoster Vaccines- Shingrix (2 of 2) 08/13/2021   MAMMOGRAM  10/29/2022   COLONOSCOPY (Pts 45-91yrs Insurance coverage will need to be confirmed)  12/26/2023   Pneumonia Vaccine 58+ Years old  Completed   INFLUENZA VACCINE  Completed   DEXA SCAN  Completed   Hepatitis C Screening  Completed   HPV VACCINES  Aged Out    Health Maintenance  Health Maintenance Due  Topic Date Due   COVID-19 Vaccine (3 - Booster for Moderna series) 09/29/2020   TETANUS/TDAP  07/22/2021    Colorectal cancer screening: Type of screening: Colonoscopy. Completed 12/25/2013. Repeat every 10 years  Mammogram done 10/28/2020 repeat 2 years  Bone Density status: Completed 11/13/2013. Results reflect: Bone density results: NORMAL. Repeat every 1 years.  Lung Cancer Screening: (Low Dose CT Chest recommended if Age 50-80 years, 30 pack-year currently smoking OR have quit w/in 15years.) does not qualify.   Lung Cancer Screening Referral: n/a  Additional Screening:  Hepatitis C Screening: does qualify; Completed 11/13/2010  Vision Screening: Recommended annual ophthalmology exams for early detection of glaucoma and other disorders of the eye. Is the patient up to date with their annual eye exam?  No  Who is the provider or what is the name of the office in which the patient attends annual eye exams? Dr Katy Fitch If pt is not established with a provider, would they like to be referred to a provider to establish care?  N/a .   Dental Screening: Recommended annual dental exams for proper oral hygiene  Community Resource Referral / Chronic Care Management: CRR required this visit?  No   CCM required this visit?  No      Plan:     I have personally reviewed and noted the following in the patients chart:   Medical and social history Use of alcohol, tobacco or illicit drugs  Current medications and supplements including opioid  prescriptions.  Functional ability and status Nutritional status Physical activity Advanced directives List of other physicians Hospitalizations, surgeries, and ER visits in previous 12 months Vitals Screenings to include cognitive, depression, and falls Referrals and appointments  In addition, I have reviewed and discussed with patient certain preventive protocols, quality metrics, and best practice recommendations. A written personalized care plan for preventive services as well as general preventive health recommendations were provided to patient.     Earline [place], [place]   08/07/2021   Nurse Notes:  Ms. Laursen , Thank you for taking time to come for your Medicare Wellness Visit. I appreciate your ongoing commitment to your health goals. Please review the following plan we discussed and let me know if I can assist you in the future.   These are the goals we discussed:  Goals      DIET - DECREASE SODA OR JUICE INTAKE     DIET - INCREASE WATER INTAKE     Set My Weight Loss Goal        This is a list of the screening recommended for you and due dates:  Health Maintenance  Topic Date Due   COVID-19 Vaccine (3 - Booster for Moderna series) 09/29/2020   Tetanus Vaccine  07/22/2021   Zoster (Shingles) Vaccine (2 of 2) 08/13/2021   Mammogram  10/29/2022   Colon Cancer Screening  12/26/2023   Pneumonia Vaccine  Completed   Flu Shot  Completed   DEXA scan (bone density measurement)  Completed   Hepatitis C Screening: USPSTF Recommendation to screen - Ages 84-79 yo.  Completed  HPV Vaccine  Aged Out

## 2021-08-07 NOTE — Patient Instructions (Signed)
°  Julia Stevens , Thank you for taking time to come for your Medicare Wellness Visit. I appreciate your ongoing commitment to your health goals. Please review the following plan we discussed and let me know if I can assist you in the future.   These are the goals we discussed:  Goals      DIET - DECREASE SODA OR JUICE INTAKE     DIET - INCREASE WATER INTAKE     Set My Weight Loss Goal        This is a list of the screening recommended for you and due dates:  Health Maintenance  Topic Date Due   COVID-19 Vaccine (3 - Booster for Moderna series) 09/29/2020   Tetanus Vaccine  07/22/2021   Zoster (Shingles) Vaccine (2 of 2) 08/13/2021   Mammogram  10/29/2022   Colon Cancer Screening  12/26/2023   Pneumonia Vaccine  Completed   Flu Shot  Completed   DEXA scan (bone density measurement)  Completed   Hepatitis C Screening: USPSTF Recommendation to screen - Ages 69-79 yo.  Completed   HPV Vaccine  Aged Out

## 2021-08-16 ENCOUNTER — Other Ambulatory Visit: Payer: Self-pay | Admitting: Family Medicine

## 2021-08-24 ENCOUNTER — Ambulatory Visit: Payer: Medicare Other | Admitting: Family Medicine

## 2021-08-24 ENCOUNTER — Other Ambulatory Visit: Payer: Self-pay | Admitting: Family Medicine

## 2021-09-15 ENCOUNTER — Other Ambulatory Visit (HOSPITAL_COMMUNITY): Payer: Self-pay | Admitting: Family Medicine

## 2021-09-15 ENCOUNTER — Other Ambulatory Visit: Payer: Self-pay

## 2021-09-15 ENCOUNTER — Encounter: Payer: Self-pay | Admitting: Family Medicine

## 2021-09-15 ENCOUNTER — Ambulatory Visit (INDEPENDENT_AMBULATORY_CARE_PROVIDER_SITE_OTHER): Payer: Medicare Other | Admitting: Family Medicine

## 2021-09-15 VITALS — BP 136/85 | Resp 16 | Ht 61.0 in | Wt 162.8 lb

## 2021-09-15 DIAGNOSIS — M130 Polyarthritis, unspecified: Secondary | ICD-10-CM | POA: Diagnosis not present

## 2021-09-15 DIAGNOSIS — E669 Obesity, unspecified: Secondary | ICD-10-CM

## 2021-09-15 DIAGNOSIS — E785 Hyperlipidemia, unspecified: Secondary | ICD-10-CM

## 2021-09-15 DIAGNOSIS — Z1231 Encounter for screening mammogram for malignant neoplasm of breast: Secondary | ICD-10-CM

## 2021-09-15 DIAGNOSIS — E66811 Obesity, class 1: Secondary | ICD-10-CM

## 2021-09-15 DIAGNOSIS — M7062 Trochanteric bursitis, left hip: Secondary | ICD-10-CM | POA: Diagnosis not present

## 2021-09-15 DIAGNOSIS — F29 Unspecified psychosis not due to a substance or known physiological condition: Secondary | ICD-10-CM | POA: Diagnosis not present

## 2021-09-15 DIAGNOSIS — I10 Essential (primary) hypertension: Secondary | ICD-10-CM | POA: Diagnosis not present

## 2021-09-15 DIAGNOSIS — R21 Rash and other nonspecific skin eruption: Secondary | ICD-10-CM

## 2021-09-15 NOTE — Patient Instructions (Signed)
F/U end August, call if you need me sooner  Please schedule mammogram at checkout  You are referred to Dr Luna Glasgow  Blood pressure is good   Careful not to fall   Tylenol 500 mg one to two daily for arthritis  Nurse pls if possible check with Pharmacy re her shingrix 2  she thinks she got it wants it if she has not had it

## 2021-09-20 ENCOUNTER — Telehealth: Payer: Self-pay

## 2021-09-20 ENCOUNTER — Other Ambulatory Visit: Payer: Self-pay | Admitting: Family Medicine

## 2021-09-20 DIAGNOSIS — E785 Hyperlipidemia, unspecified: Secondary | ICD-10-CM

## 2021-09-20 NOTE — Telephone Encounter (Signed)
Patient calling needs to know her cholesterol  medicine name.

## 2021-09-21 ENCOUNTER — Other Ambulatory Visit: Payer: Self-pay

## 2021-09-21 DIAGNOSIS — E785 Hyperlipidemia, unspecified: Secondary | ICD-10-CM

## 2021-09-21 NOTE — Telephone Encounter (Signed)
Patient aware.

## 2021-09-24 ENCOUNTER — Encounter: Payer: Self-pay | Admitting: Family Medicine

## 2021-09-24 NOTE — Assessment & Plan Note (Signed)
Hyperlipidemia:Low fat diet discussed and encouraged.   Lipid Panel  Lab Results  Component Value Date   CHOL 119 07/18/2021   HDL 40 07/18/2021   LDLCALC 62 07/18/2021   TRIG 85 07/18/2021   CHOLHDL 3.0 07/18/2021   Controlled, no change in medication

## 2021-09-24 NOTE — Progress Notes (Signed)
Julia Stevens     MRN: [id number]      DOB: [birth year]   HPI [name] is here for follow up and re-evaluation of chronic medical conditions, medication management and review of any available recent lab and radiology data.  Preventive health is updated, specifically  Cancer screening and Immunization.   Questions or concerns regarding consultations or procedures which the PT has had in the interim are  addressed. The PT denies any adverse reactions to current medications since the last visit.  C/o left leg pain and weaknee, has not fallen , but concerned that she might and wants no help at this time C/o bilateral hand pain and stiffness, with weakness and swollen joints   ROS Denies recent fever or chills. Denies sinus pressure, nasal congestion, ear pain or sore throat. Denies chest congestion, productive cough or wheezing. Denies chest pains, palpitations and leg swelling Denies abdominal pain, nausea, vomiting,diarrhea or constipation.   Denies dysuria, frequency, hesitancy or incontinence.  Denies depression,  uncontrolled anxiety or insomnia. Denies skin break down or rash.   PE  BP 136/85    Resp 16    Ht 5\' 1"  (1.549 m)    Wt 162 lb 12.8 oz (73.8 kg)    SpO2 97%    BMI 30.76 kg/m   Patient alert and oriented and in no cardiopulmonary distress.  HEENT: No facial asymmetry, EOMI,     Neck supple .  Chest: Clear to auscultation bilaterally.  CVS: S1, S2 no murmurs, no S3.Regular rate.  ABD: Soft non tender.   Ext: No edema  MS: Decreased  ROM spine, shoulders, hips and knees.Marked deformity of joints in fingers. Marked limitation in mobility of left hip  Skin: Intact, no ulcerations or rash noted.  Psych: Good eye contact, normal affect. Memory intact not anxious or depressed appearing.  CNS: CN 2-12 intact, power,  normal throughout.no focal deficits noted.   Assessment & Plan  HTN, goal below 140/80 Controlled, no change in medication DASH diet and  commitment to daily physical activity for a minimum of 30 minutes discussed and encouraged, as a part of hypertension management. The importance of attaining a healthy weight is also discussed.  BP/Weight 09/15/2021 04/20/2021 01/06/2021 11/29/2020 11/28/2020 09/26/2020 91/01/3845  Systolic BP 659 935 701 779 - - 390  Diastolic BP 85 72 80 79 - - 66  Wt. (Lbs) 162.8 163 177 - 177 177 181.12  BMI 30.76 30.8 33.44 33.44 - 33.44 34.22       Hyperlipidemia LDL goal <100 Hyperlipidemia:Low fat diet discussed and encouraged.   Lipid Panel  Lab Results  Component Value Date   CHOL 119 07/18/2021   HDL 40 07/18/2021   LDLCALC 62 07/18/2021   TRIG 85 07/18/2021   CHOLHDL 3.0 07/18/2021   Controlled, no change in medication     Obesity (BMI 30.0-34.9)  Patient re-educated about  the importance of commitment to a  minimum of 150 minutes of exercise per week as able.  The importance of healthy food choices with portion control discussed, as well as eating regularly and within a 12 hour window most days. The need to choose "clean , green" food 50 to 75% of the time is discussed, as well as to make water the primary drink and set a goal of 64 ounces water daily.    Weight /BMI 09/15/2021 04/20/2021 01/06/2021  WEIGHT 162 lb 12.8 oz 163 lb 177 lb  HEIGHT 5\' 1"  5\' 1"  5\' 1"   BMI 30.76  kg/m2 30.8 kg/m2 33.44 kg/m2   improved   Polyarthritis Increasing deformity and stiffness with pain, generalized, no interest in Rheumatology eval at this time  Rash and nonspecific skin eruption No current flare  Nonorganic psychosis Controlled, no change in medication   Trochanteric bursitis, left hip Reports increased pain and limitation in movement, refer Ortho

## 2021-09-24 NOTE — Assessment & Plan Note (Signed)
Increasing deformity and stiffness with pain, generalized, no interest in Rheumatology eval at this time

## 2021-09-24 NOTE — Assessment & Plan Note (Signed)
Reports increased pain and limitation in movement, refer Ortho

## 2021-09-24 NOTE — Assessment & Plan Note (Signed)
Controlled, no change in medication DASH diet and commitment to daily physical activity for a minimum of 30 minutes discussed and encouraged, as a part of hypertension management. The importance of attaining a healthy weight is also discussed.  BP/Weight 09/15/2021 04/20/2021 01/06/2021 11/29/2020 11/28/2020 09/26/2020 62/10/7626  Systolic BP 315 176 160 737 - - 106  Diastolic BP 85 72 80 79 - - 66  Wt. (Lbs) 162.8 163 177 - 177 177 181.12  BMI 30.76 30.8 33.44 33.44 - 33.44 34.22

## 2021-09-24 NOTE — Assessment & Plan Note (Signed)
°  Patient re-educated about  the importance of commitment to a  minimum of 150 minutes of exercise per week as able.  The importance of healthy food choices with portion control discussed, as well as eating regularly and within a 12 hour window most days. The need to choose "clean , green" food 50 to 75% of the time is discussed, as well as to make water the primary drink and set a goal of 64 ounces water daily.    Weight /BMI 09/15/2021 04/20/2021 01/06/2021  WEIGHT 162 lb 12.8 oz 163 lb 177 lb  HEIGHT 5\' 1"  5\' 1"  5\' 1"   BMI 30.76 kg/m2 30.8 kg/m2 33.44 kg/m2   improved

## 2021-09-24 NOTE — Assessment & Plan Note (Signed)
No current flare 

## 2021-09-24 NOTE — Assessment & Plan Note (Signed)
Controlled, no change in medication  

## 2021-09-26 ENCOUNTER — Ambulatory Visit: Payer: Medicare Other | Admitting: Orthopaedic Surgery

## 2021-10-03 ENCOUNTER — Ambulatory Visit (INDEPENDENT_AMBULATORY_CARE_PROVIDER_SITE_OTHER): Payer: Medicare Other | Admitting: Orthopaedic Surgery

## 2021-10-03 ENCOUNTER — Ambulatory Visit: Payer: Medicare Other

## 2021-10-03 ENCOUNTER — Encounter: Payer: Self-pay | Admitting: Orthopaedic Surgery

## 2021-10-03 ENCOUNTER — Other Ambulatory Visit: Payer: Self-pay

## 2021-10-03 VITALS — BP 202/102 | HR 111 | Ht 60.5 in | Wt 162.0 lb

## 2021-10-03 DIAGNOSIS — M7062 Trochanteric bursitis, left hip: Secondary | ICD-10-CM | POA: Diagnosis not present

## 2021-10-03 DIAGNOSIS — M25552 Pain in left hip: Secondary | ICD-10-CM

## 2021-10-03 NOTE — Progress Notes (Signed)
My hip hurts.  She has had pain in the lateral left hip for over two months, getting slowly more painful.  It hurts more in the early morning and also rolling on it at night.  She has no trauma, no numbness, no weakness.  She saw Dr. Moshe Cipro recently.  I have read the notes.  She has tried heat and tylenol with little help.  Left hip has good ROM.  She is tender over the lateral trochanteric area left. Right hip has no pain, full ROM.  NV intact.  Gait is slight limp to the left.  Encounter Diagnoses  Name Primary?   Pain in left hip Yes   Trochanteric bursitis, left hip    PROCEDURE NOTE:  The patient request injection, verbal consent was obtained.  The left trochanteric area of the hip was prepped appropriately after time out was performed.   Sterile technique was observed and injection of 1 cc of DepoMedrol 40 mg with several cc's of plain xylocaine. Anesthesia was provided by ethyl chloride and a 20-gauge needle was used to inject the hip area. The injection was tolerated well.  A band aid dressing was applied.  The patient was advised to apply ice later today and tomorrow to the injection sight as needed.  Return in two weeks.  Call if any problem.  Precautions discussed.  Electronically Signed Sanjuana Kava, MD 2/14/20233:17 PM

## 2021-10-17 ENCOUNTER — Ambulatory Visit: Payer: Medicare Other | Admitting: Orthopaedic Surgery

## 2021-10-19 ENCOUNTER — Other Ambulatory Visit: Payer: Self-pay | Admitting: Family Medicine

## 2021-10-24 ENCOUNTER — Encounter: Payer: Self-pay | Admitting: Orthopaedic Surgery

## 2021-10-24 ENCOUNTER — Ambulatory Visit (INDEPENDENT_AMBULATORY_CARE_PROVIDER_SITE_OTHER): Payer: Medicare Other | Admitting: Orthopaedic Surgery

## 2021-10-24 ENCOUNTER — Other Ambulatory Visit: Payer: Self-pay

## 2021-10-24 VITALS — BP 137/82 | HR 85 | Ht 60.5 in | Wt 161.6 lb

## 2021-10-24 DIAGNOSIS — M25552 Pain in left hip: Secondary | ICD-10-CM

## 2021-10-24 MED ORDER — HYDROCODONE-ACETAMINOPHEN 5-325 MG PO TABS: 1.0000 | ORAL_TABLET | ORAL | 0 refills | Status: AC | PRN

## 2021-10-24 NOTE — Progress Notes (Signed)
My hip is hurting. ? ?The injection for the left lateral hip pain helped but she has more pain in the groin area and with hip motion.  She has pain when first standing from seated position.  The pain slowly gets better and then she can walk OK. She has no new trauma, no redness, no numbness. ? ?Left lateral hip is not tender.  ROM is good but decreased internal and external rotation.  NV intact. ? ?Encounter Diagnosis  ?Name Primary?  ? Pain in left hip Yes  ? ?I have told her the plain films show arthritis of the hip.  She is taking ibuprofen and it helps. ? ?I will add pain medicine for those days when the pain stays with her.  Take sparingly. ? ?I have reviewed the [place] web site prior to prescribing narcotic medicine for this patient. ? ?Return in one month. ? ?She may need MRI of the hip. ? ?Call if any problem. ? ?Precautions discussed. ? ?Electronically Signed ?Sanjuana Kava, MD ?3/7/20233:23 PM ? ?

## 2021-10-30 ENCOUNTER — Ambulatory Visit (HOSPITAL_COMMUNITY): Payer: Medicare Other

## 2021-10-31 DIAGNOSIS — Z20822 Contact with and (suspected) exposure to covid-19: Secondary | ICD-10-CM | POA: Diagnosis not present

## 2021-11-01 ENCOUNTER — Ambulatory Visit (HOSPITAL_COMMUNITY)
Admission: RE | Admit: 2021-11-01 | Discharge: 2021-11-01 | Disposition: A | Discharge status: Home / Self Care | Payer: Medicare Other | Source: Ambulatory Visit | Attending: Family Medicine | Admitting: Family Medicine

## 2021-11-01 ENCOUNTER — Other Ambulatory Visit: Payer: Self-pay

## 2021-11-01 DIAGNOSIS — Z1231 Encounter for screening mammogram for malignant neoplasm of breast: Secondary | ICD-10-CM | POA: Insufficient documentation

## 2021-11-02 ENCOUNTER — Other Ambulatory Visit (HOSPITAL_COMMUNITY): Payer: Self-pay | Admitting: Family Medicine

## 2021-11-18 HISTORY — PX: BREAST BIOPSY: SHX20

## 2021-11-21 ENCOUNTER — Encounter: Payer: Self-pay | Admitting: Orthopaedic Surgery

## 2021-11-21 ENCOUNTER — Other Ambulatory Visit: Payer: Self-pay | Admitting: Family Medicine

## 2021-11-21 ENCOUNTER — Ambulatory Visit (INDEPENDENT_AMBULATORY_CARE_PROVIDER_SITE_OTHER): Payer: Medicare Other | Admitting: Orthopaedic Surgery

## 2021-11-21 VITALS — BP 136/86 | HR 89 | Ht 60.5 in | Wt 160.0 lb

## 2021-11-21 DIAGNOSIS — M25552 Pain in left hip: Secondary | ICD-10-CM

## 2021-11-21 MED ORDER — HYDROCODONE-ACETAMINOPHEN 5-325 MG PO TABS: ORAL_TABLET | ORAL | 0 refills | Status: DC

## 2021-11-21 NOTE — Progress Notes (Signed)
My hip is not hurting as much. ? ?Her left hip is a little better.  She has pain after sitting a while then when first standing.  She has no new trauma.  She has no numbness. ? ?ROM of the left hip is decreased internal and external rotation and a limp on the left.  NV intact. ? ?I will hold off on MRI for now. ? ?Encounter Diagnosis  ?Name Primary?  ? Pain in left hip Yes  ? ?I will refill pain medicine. ? ?Try to stay active. ? ?Consider MRI if pain persists or gets worse. ? ?I have reviewed the [place] web site prior to prescribing narcotic medicine for this patient. ? ?Electronically Signed ?Sanjuana Kava, MD ?4/4/20233:44 PM ? ?

## 2021-11-28 ENCOUNTER — Ambulatory Visit (HOSPITAL_COMMUNITY)
Admission: RE | Admit: 2021-11-28 | Discharge: 2021-11-28 | Disposition: A | Discharge status: Home / Self Care | Payer: Medicare Other | Source: Ambulatory Visit | Attending: Family Medicine | Admitting: Family Medicine

## 2021-11-28 ENCOUNTER — Other Ambulatory Visit (HOSPITAL_COMMUNITY): Payer: Self-pay | Admitting: Family Medicine

## 2021-11-28 DIAGNOSIS — R928 Other abnormal and inconclusive findings on diagnostic imaging of breast: Secondary | ICD-10-CM | POA: Insufficient documentation

## 2021-11-28 DIAGNOSIS — R921 Mammographic calcification found on diagnostic imaging of breast: Secondary | ICD-10-CM | POA: Diagnosis not present

## 2021-11-28 DIAGNOSIS — R922 Inconclusive mammogram: Secondary | ICD-10-CM | POA: Diagnosis not present

## 2021-12-04 DIAGNOSIS — Z20822 Contact with and (suspected) exposure to covid-19: Secondary | ICD-10-CM | POA: Diagnosis not present

## 2021-12-06 ENCOUNTER — Other Ambulatory Visit (HOSPITAL_COMMUNITY): Payer: Medicare Other

## 2021-12-12 ENCOUNTER — Ambulatory Visit (HOSPITAL_COMMUNITY)
Admission: RE | Admit: 2021-12-12 | Discharge: 2021-12-12 | Disposition: A | Discharge status: Home / Self Care | Payer: Medicare Other | Source: Ambulatory Visit | Attending: Family Medicine | Admitting: Family Medicine

## 2021-12-12 ENCOUNTER — Encounter (HOSPITAL_COMMUNITY): Payer: Self-pay

## 2021-12-12 ENCOUNTER — Other Ambulatory Visit (HOSPITAL_COMMUNITY): Payer: Self-pay | Admitting: Family Medicine

## 2021-12-12 DIAGNOSIS — R928 Other abnormal and inconclusive findings on diagnostic imaging of breast: Secondary | ICD-10-CM | POA: Diagnosis not present

## 2021-12-12 DIAGNOSIS — N6321 Unspecified lump in the left breast, upper outer quadrant: Secondary | ICD-10-CM | POA: Diagnosis not present

## 2021-12-12 DIAGNOSIS — N6022 Fibroadenosis of left breast: Secondary | ICD-10-CM | POA: Diagnosis not present

## 2021-12-12 DIAGNOSIS — N62 Hypertrophy of breast: Secondary | ICD-10-CM | POA: Diagnosis not present

## 2021-12-12 MED ORDER — LIDOCAINE HCL (PF) 2 % IJ SOLN
10.0000 mL | Freq: Once | INTRAMUSCULAR | Status: AC
Start: 2021-12-12 — End: 2021-12-12
  Administered 2021-12-12: 10 mL

## 2021-12-12 MED ORDER — LIDOCAINE-EPINEPHRINE 1 %-1:200000 IJ SOLN
10.0000 mL | Freq: Once | INTRAMUSCULAR | Status: AC
Start: 2021-12-12 — End: 2021-12-12
  Administered 2021-12-12: 10 mL via INTRADERMAL

## 2021-12-12 NOTE — Progress Notes (Signed)
PT tolerated left breast biopsy well today with NAD noted. PT verbalized understanding of discharge instructions. PT pushed via wheelchair back to the mammogram area this time and given ice packs for home. ?

## 2021-12-14 LAB — SURGICAL PATHOLOGY

## 2021-12-19 ENCOUNTER — Encounter: Payer: Self-pay | Admitting: Orthopaedic Surgery

## 2021-12-19 ENCOUNTER — Ambulatory Visit (INDEPENDENT_AMBULATORY_CARE_PROVIDER_SITE_OTHER): Payer: Medicare Other | Admitting: Orthopaedic Surgery

## 2021-12-19 DIAGNOSIS — M25552 Pain in left hip: Secondary | ICD-10-CM

## 2021-12-19 NOTE — Progress Notes (Signed)
I am a little better. ? ?She has left hip pain but is slightly better. She is walking better.  She has no new trauma, no redness, no numbness.  She is sleeping better on the hip. ? ?ROM of the left hip is good.  NV intact. Gait is slow but good. ? ?Encounter Diagnosis  ?Name Primary?  ? Pain in left hip Yes  ? ?Continue as she is doing. ? ?I had given her hydrocodone in past and she still has some.  Use only as needed. ? ?Return in five weeks. ? ?Call if any problem. ? ?Precautions discussed. ? ?Electronically Signed ?Sanjuana Kava, MD ?5/2/20232:48 PM ? ?

## 2021-12-22 ENCOUNTER — Other Ambulatory Visit: Payer: Self-pay | Admitting: Family Medicine

## 2021-12-25 DIAGNOSIS — Z20822 Contact with and (suspected) exposure to covid-19: Secondary | ICD-10-CM | POA: Diagnosis not present

## 2021-12-27 ENCOUNTER — Other Ambulatory Visit: Payer: Self-pay | Admitting: Family Medicine

## 2021-12-27 ENCOUNTER — Other Ambulatory Visit: Payer: Self-pay | Admitting: Internal Medicine

## 2022-01-23 ENCOUNTER — Encounter: Payer: Self-pay | Admitting: Orthopaedic Surgery

## 2022-01-23 ENCOUNTER — Ambulatory Visit (INDEPENDENT_AMBULATORY_CARE_PROVIDER_SITE_OTHER): Payer: Medicare Other | Admitting: Orthopaedic Surgery

## 2022-01-23 ENCOUNTER — Other Ambulatory Visit: Payer: Self-pay | Admitting: Family Medicine

## 2022-01-23 VITALS — BP 140/82 | HR 86 | Ht 60.5 in | Wt 155.0 lb

## 2022-01-23 DIAGNOSIS — M25552 Pain in left hip: Secondary | ICD-10-CM

## 2022-01-23 DIAGNOSIS — E785 Hyperlipidemia, unspecified: Secondary | ICD-10-CM

## 2022-01-23 MED ORDER — HYDROCODONE-ACETAMINOPHEN 5-325 MG PO TABS: ORAL_TABLET | ORAL | 0 refills | Status: DC

## 2022-01-23 NOTE — Progress Notes (Signed)
My hip is better.  Her left hip pain is less.  She is walking better.  She has no new trauma.  She has no numbness.  The left hip is not that tender today. ROM of the left hip is good.  She does not fully extend the knees when walking and has a slightly bouncing gait.  NV intact.  Encounter Diagnosis  Name Primary?   Pain in left hip Yes   I will refill pain medicine.  I have reviewed the [place] web site prior to prescribing narcotic medicine for this patient.  Return in two months.  Call if any problem.  Precautions discussed.  Electronically Signed Sanjuana Kava, MD 6/6/20232:07 PM

## 2022-02-23 ENCOUNTER — Other Ambulatory Visit: Payer: Self-pay | Admitting: Family Medicine

## 2022-03-27 ENCOUNTER — Ambulatory Visit: Payer: Medicare Other | Admitting: Orthopaedic Surgery

## 2022-04-03 ENCOUNTER — Other Ambulatory Visit: Payer: Self-pay | Admitting: Family Medicine

## 2022-04-11 ENCOUNTER — Ambulatory Visit: Payer: Medicare Other | Admitting: Family Medicine

## 2022-04-30 ENCOUNTER — Other Ambulatory Visit: Payer: Self-pay | Admitting: Family Medicine

## 2022-05-08 ENCOUNTER — Ambulatory Visit (INDEPENDENT_AMBULATORY_CARE_PROVIDER_SITE_OTHER): Payer: Medicare Other | Admitting: Family Medicine

## 2022-05-08 ENCOUNTER — Encounter: Payer: Self-pay | Admitting: Family Medicine

## 2022-05-08 ENCOUNTER — Other Ambulatory Visit: Payer: Self-pay

## 2022-05-08 VITALS — BP 120/80 | HR 88 | Ht 61.0 in | Wt 161.0 lb

## 2022-05-08 DIAGNOSIS — I10 Essential (primary) hypertension: Secondary | ICD-10-CM

## 2022-05-08 DIAGNOSIS — L309 Dermatitis, unspecified: Secondary | ICD-10-CM

## 2022-05-08 DIAGNOSIS — Z23 Encounter for immunization: Secondary | ICD-10-CM

## 2022-05-08 DIAGNOSIS — E785 Hyperlipidemia, unspecified: Secondary | ICD-10-CM | POA: Diagnosis not present

## 2022-05-08 DIAGNOSIS — I739 Peripheral vascular disease, unspecified: Secondary | ICD-10-CM

## 2022-05-08 DIAGNOSIS — E559 Vitamin D deficiency, unspecified: Secondary | ICD-10-CM

## 2022-05-08 DIAGNOSIS — M130 Polyarthritis, unspecified: Secondary | ICD-10-CM | POA: Diagnosis not present

## 2022-05-08 DIAGNOSIS — R350 Frequency of micturition: Secondary | ICD-10-CM | POA: Diagnosis not present

## 2022-05-08 LAB — POCT URINALYSIS DIP (CLINITEK)
Bilirubin, UA: NEGATIVE
Blood, UA: NEGATIVE
Glucose, UA: NEGATIVE mg/dL
Ketones, POC UA: NEGATIVE mg/dL
Nitrite, UA: NEGATIVE
POC PROTEIN,UA: 100 — AB
Spec Grav, UA: >=1.03 — AB (ref 1.010–1.025)
Urobilinogen, UA: 0.2 E.U./dL
pH, UA: 6 (ref 5.0–8.0)

## 2022-05-08 MED ORDER — NORTRIPTYLINE HCL 50 MG PO CAPS: 50.0000 mg | ORAL_CAPSULE | Freq: Every day | ORAL | 5 refills | Status: DC

## 2022-05-08 MED ORDER — POTASSIUM CHLORIDE ER 10 MEQ PO TBCR: 10.0000 meq | EXTENDED_RELEASE_TABLET | Freq: Two times a day (BID) | ORAL | 5 refills | Status: DC

## 2022-05-08 MED ORDER — HYDROCHLOROTHIAZIDE 25 MG PO TABS: 25.0000 mg | ORAL_TABLET | Freq: Every day | ORAL | 5 refills | Status: DC

## 2022-05-08 MED ORDER — DILTIAZEM HCL ER COATED BEADS 300 MG PO CP24
300.0000 mg | ORAL_CAPSULE | Freq: Every day | ORAL | 5 refills | Status: DC
Start: 2022-05-08 — End: 2022-06-05

## 2022-05-08 NOTE — Progress Notes (Signed)
   Julia Stevens     MRN: [id number]      DOB: 01-Nov-1945   HPI Julia Stevens is here for follow up and re-evaluation of chronic medical conditions, medication management and review of any available recent lab and radiology data.  Preventive health is updated, specifically  Cancer screening and Immunization.      ROS Denies recent fever or chills. Denies sinus pressure, nasal congestion, ear pain or sore throat. Denies chest congestion, productive cough or wheezing. Denies chest pains, palpitations and leg swelling Denies abdominal pain, nausea, vomiting,diarrhea or constipation.   Denies dysuria, frequency, hesitancy or incontinence. Denies joint pain, swelling and limitation in mobility. Denies headaches, seizures, numbness, or tingling. Denies depression, anxiety or insomnia. Denies skin break down or rash.   PE  BP 130/78 (BP Location: Right Arm, Patient Position: Sitting, Cuff Size: Normal)   Pulse 88   Ht '5\' 1"'$  (1.549 m)   Wt 161 lb (73 kg)   SpO2 98%   BMI 30.42 kg/m   Patient alert and oriented and in no cardiopulmonary distress.  HEENT: No facial asymmetry, EOMI,     Neck supple .  Chest: Clear to auscultation bilaterally.  CVS: S1, S2 no murmurs, no S3.Regular rate.  ABD: Soft non tender.   Ext: No edema  MS: Adequate ROM spine, shoulders, hips and knees.  Skin: Intact, no ulcerations or rash noted.  Psych: Good eye contact, normal affect. Memory intact not anxious or depressed appearing.  CNS: CN 2-12 intact, power,  normal throughout.no focal deficits noted.   Assessment & Plan Frequent urination 1 week h/o frequent urination with bad odor, no fever, chills , flank pain or dysuria, past h/o recurrent UTI Check CCUA, this is abn, no rx until c/s available  PVD (peripheral vascular disease) ([place]) Hyperpigmentation of skin in LE with decreased pulses, refer to vascular for eval and management  HTN, goal below 140/80 DASH diet and commitment to  daily physical activity for a minimum of 30 minutes discussed and encouraged, as a part of hypertension management. The importance of attaining a healthy weight is also discussed. Controlled, no change in medication     05/08/2022    3:09 PM 05/08/2022    2:21 PM 01/23/2022    1:58 PM 12/12/2021    1:00 PM 11/21/2021    3:10 PM 10/24/2021    2:57 PM 10/03/2021    2:24 PM  BP/Weight  Systolic BP 562 130 865 784 696 295 284  Diastolic BP 80 78 82 80 86 82 102  Wt. (Lbs)  161 155  160 161.6 162  BMI  30.42 kg/m2 29.77 kg/m2  30.73 kg/m2 31.04 kg/m2 31.12 kg/m2       Hyperlipidemia LDL goal <100 Hyperlipidemia:Low fat diet discussed and encouraged.   Lipid Panel  Lab Results  Component Value Date   CHOL 119 07/18/2021   HDL 40 07/18/2021   LDLCALC 62 07/18/2021   TRIG 85 07/18/2021   CHOLHDL 3.0 07/18/2021     Updated lab needed at/ before next visit.   Polyarthritis Currently stable   Eczema of both hands Controlled, no current skin breakdown or rash

## 2022-05-08 NOTE — Patient Instructions (Addendum)
F/U in 6 months, call if you need me sooner  Flu vaccine today  Please get ciovid and shingrix vaccines at your pharmacy , when available  Please send urine for c/s, will treat if you have an infection, should  know by Friday, if not, next week Monday  Fasting cBC, lipid, cmp and eGFR, TSH and vit D next  1 to 2 week  Nurse pls refill 30 tabs with 5 refills for HCTZ 25 mg, potassium10 meq and Diltiazem ER  300 mg  Since hydrocodone causes you to itch, I recommend you not take it and let Dr Luna Glasgow know  You are referred to vascular surgeon to assess circulation in legs  Thanks for choosing [facility], we consider it a privelige to serve you.

## 2022-05-11 LAB — URINE CULTURE

## 2022-05-13 ENCOUNTER — Encounter: Payer: Self-pay | Admitting: Family Medicine

## 2022-05-13 DIAGNOSIS — R350 Frequency of micturition: Secondary | ICD-10-CM | POA: Insufficient documentation

## 2022-05-13 DIAGNOSIS — I739 Peripheral vascular disease, unspecified: Secondary | ICD-10-CM | POA: Insufficient documentation

## 2022-05-13 NOTE — Assessment & Plan Note (Signed)
Controlled, no current skin breakdown or rash

## 2022-05-13 NOTE — Assessment & Plan Note (Signed)
Hyperpigmentation of skin in LE with decreased pulses, refer to vascular for eval and management

## 2022-05-13 NOTE — Assessment & Plan Note (Signed)
1 week h/o frequent urination with bad odor, no fever, chills , flank pain or dysuria, past h/o recurrent UTI Check CCUA, this is abn, no rx until c/s available

## 2022-05-13 NOTE — Assessment & Plan Note (Signed)
Currently stable.

## 2022-05-13 NOTE — Assessment & Plan Note (Signed)
DASH diet and commitment to daily physical activity for a minimum of 30 minutes discussed and encouraged, as a part of hypertension management. The importance of attaining a healthy weight is also discussed. Controlled, no change in medication     05/08/2022    3:09 PM 05/08/2022    2:21 PM 01/23/2022    1:58 PM 12/12/2021    1:00 PM 11/21/2021    3:10 PM 10/24/2021    2:57 PM 10/03/2021    2:24 PM  BP/Weight  Systolic BP 903 009 233 007 622 633 354  Diastolic BP 80 78 82 80 86 82 102  Wt. (Lbs)  161 155  160 161.6 162  BMI  30.42 kg/m2 29.77 kg/m2  30.73 kg/m2 31.04 kg/m2 31.12 kg/m2

## 2022-05-13 NOTE — Assessment & Plan Note (Signed)
Hyperlipidemia:Low fat diet discussed and encouraged.   Lipid Panel  Lab Results  Component Value Date   CHOL 119 07/18/2021   HDL 40 07/18/2021   LDLCALC 62 07/18/2021   TRIG 85 07/18/2021   CHOLHDL 3.0 07/18/2021     Updated lab needed at/ before next visit.

## 2022-05-25 ENCOUNTER — Other Ambulatory Visit: Payer: Self-pay | Admitting: *Deleted

## 2022-05-25 DIAGNOSIS — I739 Peripheral vascular disease, unspecified: Secondary | ICD-10-CM

## 2022-05-30 ENCOUNTER — Ambulatory Visit (INDEPENDENT_AMBULATORY_CARE_PROVIDER_SITE_OTHER): Payer: Medicare Other | Admitting: Vascular Surgery

## 2022-05-30 ENCOUNTER — Encounter: Payer: Self-pay | Admitting: Vascular Surgery

## 2022-05-30 ENCOUNTER — Ambulatory Visit (INDEPENDENT_AMBULATORY_CARE_PROVIDER_SITE_OTHER): Payer: Medicare Other

## 2022-05-30 VITALS — BP 128/82 | HR 83 | Temp 98.1°F | Ht 61.0 in | Wt 161.0 lb

## 2022-05-30 DIAGNOSIS — M25562 Pain in left knee: Secondary | ICD-10-CM

## 2022-05-30 DIAGNOSIS — M25561 Pain in right knee: Secondary | ICD-10-CM | POA: Diagnosis not present

## 2022-05-30 DIAGNOSIS — I739 Peripheral vascular disease, unspecified: Secondary | ICD-10-CM

## 2022-05-30 NOTE — Progress Notes (Signed)
[facility] [place]  Patient name: Julia Stevens MRN: [id number] DOB: [birth year] Sex: female  REASON FOR CONSULT: Evaluation bilateral lower extremity pain, rule out arterial insufficiency  HPI: Julia Stevens is a 76 y.o. female, who is here today for evaluation.  She has difficulty with both lower extremities.  She reports discomfort from her mid calf down to her ankles.  This is mainly associated with swelling and skin changes at this area.  She does have rheumatoid arthritis and has deformities of her toes but no tissue loss.  She does not have any claudication type symptoms.  She does have skin changes bilaterally but no ulceration  Past Medical History:  Diagnosis Date   Arthritis    Dysphagia    Eczema    Hyperlipidemia    Hypertension    Psychosis ([place])    Scleroderma ([place])    Shingles     Family History  Problem Relation Age of Onset   Hypertension Mother    Heart disease Mother        CAD   Hypertension Father         Severe DJD/ macrobacterium   Hypertension Sister    Hypertension Sister    Fibromyalgia Sister    Diabetes Brother    Heart disease Brother    Colon cancer Neg Hx     SOCIAL HISTORY: Social History   Socioeconomic History   Marital status: Single    Spouse name: Not on file   Number of children: 0   Years of education: 12   Highest education level: 12th grade  Occupational History   Occupation: disabled     Fish farm manager: UNEMPLOYED  Tobacco Use   Smoking status: Never   Smokeless tobacco: Never  Vaping Use   Vaping Use: Never used  Substance and Sexual Activity   Alcohol use: No   Drug use: No   Sexual activity: Not Currently    Birth control/protection: Surgical  Other Topics Concern   Not on file  Social History Narrative   Lives alone in apartment    Social Determinants of Health   Financial Resource Strain: Low Risk  (08/07/2021)   Overall Financial Resource Strain  (CARDIA)    Difficulty of Paying Living Expenses: Not hard at all  Food Insecurity: No Food Insecurity (08/07/2021)   Hunger Vital Sign    Worried About Running Out of Food in the Last Year: Never true    Ran Out of Food in the Last Year: Never true  Transportation Needs: No Transportation Needs (08/07/2021)   PRAPARE - Hydrologist (Medical): No    Lack of Transportation (Non-Medical): No  Physical Activity: Insufficiently Active (08/07/2021)   Exercise Vital Sign    Days of Exercise per Week: 4 days    Minutes of Exercise per Session: 10 min  Stress: No Stress Concern Present (08/07/2021)   Speed    Feeling of Stress : Not at all  Social Connections: Socially Isolated (08/07/2021)   Social Connection and Isolation Panel [NHANES]    Frequency of Communication with Friends and Family: More than three times a week    Frequency of Social Gatherings with Friends and Family: Once a week    Attends Religious Services: Never    Marine scientist or Organizations: No    Attends Archivist Meetings: Never    Marital Status: Never married  Human resources officer  Violence: Not At Risk (08/07/2021)   Humiliation, Afraid, Rape, and Kick questionnaire    Fear of Current or Ex-Partner: No    Emotionally Abused: No    Physically Abused: No    Sexually Abused: No    Allergies  Allergen Reactions   Ace Inhibitors Cough   Ciprofloxacin    Hydrocodone Itching   Penicillins     Has patient had a PCN reaction causing immediate rash, facial/tongue/throat swelling, SOB or lightheadedness with hypotension: Unknown Has patient had a PCN reaction causing severe rash involving mucus membranes or skin necrosis: Unknown Has patient had a PCN reaction that required hospitalization: Unknown Has patient had a PCN reaction occurring within the last 10 years: No If all of the above answers are "NO",  then may proceed with Cephalosporin use.     Current Outpatient Medications  Medication Sig Dispense Refill   acetaminophen (TYLENOL) 500 MG tablet Take one tablet by mouth once to twice daily for arthritis 60 tablet 0   calcium carbonate (OS-CAL) 600 MG TABS tablet Take 600 mg by mouth 2 (two) times daily with a meal.     diltiazem (CARDIZEM CD) 300 MG 24 hr capsule Take 1 capsule (300 mg total) by mouth daily. 30 capsule 5   hydrochlorothiazide (HYDRODIURIL) 25 MG tablet Take 1 tablet (25 mg total) by mouth daily. 30 tablet 5   HYDROcodone-acetaminophen (NORCO/VICODIN) 5-325 MG tablet One tablet every six hours for pain.  Limit 7 days. 28 tablet 0   ibuprofen (ADVIL) 200 MG tablet Take 600 mg by mouth every 6 (six) hours as needed for headache.     Multiple Vitamin (MULTIVITAMIN) capsule Take 1 capsule by mouth daily.     nortriptyline (PAMELOR) 50 MG capsule Take 1 capsule (50 mg total) by mouth at bedtime. 30 capsule 5   potassium chloride (KLOR-CON) 10 MEQ tablet Take 1 tablet (10 mEq total) by mouth 2 (two) times daily. 60 tablet 5   No current facility-administered medications for this visit.    REVIEW OF SYSTEMS:  '[X]'$  denotes positive finding, '[ ]'$  denotes negative finding Cardiac  Comments:  Chest pain or chest pressure:    Shortness of breath upon exertion:    Short of breath when lying flat:    Irregular heart rhythm:        Vascular    Pain in calf, thigh, or hip brought on by ambulation:    Pain in feet at night that wakes you up from your sleep:     Blood clot in your veins:    Leg swelling:  x       Pulmonary    Oxygen at home:    Productive cough:     Wheezing:         Neurologic    Sudden weakness in arms or legs:     Sudden numbness in arms or legs:     Sudden onset of difficulty speaking or slurred speech:    Temporary loss of vision in one eye:     Problems with dizziness:         Gastrointestinal    Blood in stool:     Vomited blood:          Genitourinary    Burning when urinating:     Blood in urine:        Psychiatric    Major depression:         Hematologic    Bleeding problems:    Problems with  blood clotting too easily:        Skin    Rashes or ulcers:        Constitutional    Fever or chills:      PHYSICAL EXAM: Vitals:   05/30/22 1541  BP: 128/82  Pulse: 83  Temp: 98.1 F (36.7 C)  Weight: 161 lb (73 kg)  Height: '5\' 1"'$  (1.549 m)    GENERAL: The patient is a well-nourished female, in no acute distress. The vital signs are documented above. CARDIOVASCULAR: 2+ radial and 2+ dorsalis pedis pulses bilaterally. PULMONARY: There is good air exchange  MUSCULOSKELETAL: There are no major deformities or cyanosis. NEUROLOGIC: No focal weakness or paresthesias are detected. SKIN: Marked swelling and hemosiderin deposits bilaterally from mid calf to ankle. PSYCHIATRIC: The patient has a normal affect.  DATA:  Lower extremity arterial noninvasive studies normal with normal ankle arm index and normal triphasic waveforms bilaterally  I did image her saphenous veins bilaterally with SonoSite ultrasound that shows no dilatation of her saphenous vein  MEDICAL ISSUES: I explained to the patient that she does not have any evidence of arterial insufficiency.  She does have apparent venous stasis disease causing the skin changes.  I explained treatment of this would be elevation and compression.  I have recommended knee-high 20 to 30 mmHg compression garments and instructed on use of these.  She will see [place] again on an as-needed basis   Rosetta Posner, MD [facility] Tel [phone] Pager [phone]  Note: Portions of this report may have been transcribed using voice recognition software.  Every effort has been made to ensure accuracy; however, inadvertent computerized transcription errors may still be present.

## 2022-06-05 ENCOUNTER — Other Ambulatory Visit: Payer: Self-pay | Admitting: Family Medicine

## 2022-06-06 ENCOUNTER — Encounter: Payer: Medicare Other | Admitting: Vascular Surgery

## 2022-06-08 ENCOUNTER — Other Ambulatory Visit: Payer: Self-pay | Admitting: Family Medicine

## 2022-06-19 ENCOUNTER — Other Ambulatory Visit: Payer: Self-pay | Admitting: Family Medicine

## 2022-06-22 ENCOUNTER — Telehealth: Payer: Self-pay | Admitting: Family Medicine

## 2022-06-22 ENCOUNTER — Other Ambulatory Visit: Payer: Self-pay

## 2022-06-22 MED ORDER — NORTRIPTYLINE HCL 50 MG PO CAPS
50.0000 mg | ORAL_CAPSULE | Freq: Every day | ORAL | 5 refills | Status: DC
Start: 2022-06-22 — End: 2022-11-22

## 2022-06-22 NOTE — Telephone Encounter (Signed)
Refilled

## 2022-06-22 NOTE — Telephone Encounter (Signed)
Patient needs refill on     nortriptyline (PAMELOR) 50 MG capsule

## 2022-07-05 ENCOUNTER — Telehealth: Payer: Self-pay | Admitting: Family Medicine

## 2022-07-05 NOTE — Telephone Encounter (Signed)
Pt called wanting to know what vaccines she has taken & what she is needing. Can you please give pt a call when available?

## 2022-07-05 NOTE — Telephone Encounter (Signed)
LMTRC

## 2022-08-02 ENCOUNTER — Other Ambulatory Visit: Payer: Self-pay | Admitting: Orthopaedic Surgery

## 2022-08-22 ENCOUNTER — Other Ambulatory Visit: Payer: Self-pay | Admitting: Orthopaedic Surgery

## 2022-08-29 ENCOUNTER — Other Ambulatory Visit: Payer: Self-pay | Admitting: Family Medicine

## 2022-08-31 DIAGNOSIS — Z23 Encounter for immunization: Secondary | ICD-10-CM | POA: Diagnosis not present

## 2022-09-24 ENCOUNTER — Other Ambulatory Visit: Payer: Self-pay | Admitting: Family Medicine

## 2022-10-25 ENCOUNTER — Other Ambulatory Visit: Payer: Self-pay | Admitting: Family Medicine

## 2022-11-06 ENCOUNTER — Encounter: Payer: Self-pay | Admitting: Family Medicine

## 2022-11-06 ENCOUNTER — Telehealth: Payer: Self-pay | Admitting: Family Medicine

## 2022-11-06 ENCOUNTER — Ambulatory Visit (INDEPENDENT_AMBULATORY_CARE_PROVIDER_SITE_OTHER): Payer: Medicare Other | Admitting: Family Medicine

## 2022-11-06 VITALS — BP 120/82 | HR 100 | Ht 61.0 in | Wt 161.0 lb

## 2022-11-06 DIAGNOSIS — E66811 Obesity, class 1: Secondary | ICD-10-CM

## 2022-11-06 DIAGNOSIS — I10 Essential (primary) hypertension: Secondary | ICD-10-CM

## 2022-11-06 DIAGNOSIS — E559 Vitamin D deficiency, unspecified: Secondary | ICD-10-CM | POA: Diagnosis not present

## 2022-11-06 DIAGNOSIS — E669 Obesity, unspecified: Secondary | ICD-10-CM

## 2022-11-06 DIAGNOSIS — E785 Hyperlipidemia, unspecified: Secondary | ICD-10-CM

## 2022-11-06 DIAGNOSIS — M25552 Pain in left hip: Secondary | ICD-10-CM

## 2022-11-06 DIAGNOSIS — F29 Unspecified psychosis not due to a substance or known physiological condition: Secondary | ICD-10-CM | POA: Diagnosis not present

## 2022-11-06 DIAGNOSIS — Z1231 Encounter for screening mammogram for malignant neoplasm of breast: Secondary | ICD-10-CM | POA: Diagnosis not present

## 2022-11-06 DIAGNOSIS — L309 Dermatitis, unspecified: Secondary | ICD-10-CM

## 2022-11-06 DIAGNOSIS — G8929 Other chronic pain: Secondary | ICD-10-CM

## 2022-11-06 DIAGNOSIS — R928 Other abnormal and inconclusive findings on diagnostic imaging of breast: Secondary | ICD-10-CM | POA: Diagnosis not present

## 2022-11-06 NOTE — Assessment & Plan Note (Signed)
Controlled, no change in medication  

## 2022-11-06 NOTE — Assessment & Plan Note (Signed)
Increase x 2 months, no inciting trauma, refer Ortho

## 2022-11-06 NOTE — Progress Notes (Signed)
Julia Stevens     MRN: [id number]      DOB: 06/02/1946   HPI Ms. Ostrand is here for follow up and re-evaluation of chronic medical conditions, medication management and review of any available recent lab and radiology data.  Preventive health is updated, specifically  Cancer screening and Immunization.   C./o increased and uncontrolled left hip pain, no falls reported.Ambulation limited due to pain Has seen vascular, has not purchased hose recommended, states she has some at home she will use and has not been elevating feet  ROS Denies recent fever or chills. Denies sinus pressure, nasal congestion, ear pain or sore throat. Denies chest congestion, productive cough or wheezing. Denies chest pains, palpitations and leg swelling Denies abdominal pain, nausea, vomiting,diarrhea or constipation.   Denies dysuria, frequency, hesitancy or incontinence. Denies headaches, seizures, numbness, or tingling. Denies depression, anxiety or insomnia. Denies skin break down or rash.   PE  BP 120/82   Pulse 100   Ht 5\' 1"  (1.549 m)   Wt 161 lb (73 kg)   SpO2 98%   BMI 30.42 kg/m   Patient alert and oriented and in no cardiopulmonary distress.  HEENT: No facial asymmetry, EOMI,     Neck decreased ROM .  Chest: Clear to auscultation bilaterally.  CVS: S1, S2 no murmurs, no S3.Regular rate.  ABD: Soft non tender.   Ext: No edema  MS: decreased  ROM spine, shoulders, hips and knees.marked deformity of fingers  Skin: Intact, hyperpigmentation of lower ext, no ulceration Psych: Good eye contact, normal affect. Memory intact not anxious or depressed appearing.  CNS: CN 2-12 intact, power,  normal throughout.no focal deficits noted.   Assessment & Plan  HTN, goal below 140/80 Controlled, no change in medication DASH diet and commitment to daily physical activity for a minimum of 30 minutes discussed and encouraged, as a part of hypertension management. The importance of attaining  a healthy weight is also discussed.     11/06/2022    2:55 PM 11/06/2022    2:18 PM 11/06/2022    2:16 PM 05/30/2022    3:41 PM 05/08/2022    3:09 PM 05/08/2022    2:21 PM 01/23/2022    1:58 PM  BP/Weight  Systolic BP 123456 [id number]  Diastolic BP 82 84 88 82 80 78 82  Wt. 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   Lipid Panel  Lab Results  Component Value Date   CHOL 119 07/18/2021   HDL 40 07/18/2021   LDLCALC 62 07/18/2021   TRIG 85 07/18/2021   CHOLHDL 3.0 07/18/2021     Updated lab needed   Eczema of both hands Controlled, no change in medication   Obesity (BMI 30.0-34.9)  Patient re-educated about  the importance of commitment to a  minimum of 150 minutes of exercise per week as able.  The importance of healthy food choices with portion control discussed, as well as eating regularly and within a 12 hour window most days. The need to choose "clean , green" food 50 to 75% of the time is discussed, as well as to make water the primary drink and set a goal of  64 ounces water daily.       11/06/2022    2:16 PM 05/30/2022    3:41 PM 05/08/2022    2:21 PM  Weight /BMI  Weight 161 lb 161 lb 161 lb  Height 5\' 1"  (1.549 m) 5\' 1"  (1.549 m) 5\' 1"  (1.549 m)  BMI 30.42 kg/m2 30.42 kg/m2 30.42 kg/m2    Unchanged

## 2022-11-06 NOTE — Telephone Encounter (Signed)
Per radiology pt needs diagnostic bilateral mammo with both breast US. Can you please order?

## 2022-11-06 NOTE — Telephone Encounter (Signed)
ordered

## 2022-11-06 NOTE — Assessment & Plan Note (Signed)
Hyperlipidemia:Low fat diet discussed and encouraged.   Lipid Panel  Lab Results  Component Value Date   CHOL 119 07/18/2021   HDL 40 07/18/2021   LDLCALC 62 07/18/2021   TRIG 85 07/18/2021   CHOLHDL 3.0 07/18/2021     Updated lab needed

## 2022-11-06 NOTE — Assessment & Plan Note (Signed)
Stable and controlled on medication 

## 2022-11-06 NOTE — Patient Instructions (Addendum)
F/U mid September, call if you need me sooner  Labs ordered in September to be done today  Please schedule mammogram at checkout  Nurse pt states she had 2 shingrix, and  covid vaccines at [facility]  pls document ( also 4th vaccine not sure if pneumonia or RSV) on [address]  You are referred to Dr Luna Glasgow   Please get compression hose for poor circulation in legs causing swelling and pain  Thanks for choosing [facility], we consider it a privelige to serve you.

## 2022-11-06 NOTE — Assessment & Plan Note (Signed)
Controlled, no change in medication DASH diet and commitment to daily physical activity for a minimum of 30 minutes discussed and encouraged, as a part of hypertension management. The importance of attaining a healthy weight is also discussed.     11/06/2022    2:55 PM 11/06/2022    2:18 PM 11/06/2022    2:16 PM 05/30/2022    3:41 PM 05/08/2022    3:09 PM 05/08/2022    2:21 PM 01/23/2022    1:58 PM  BP/Weight  Systolic BP 123456 [id number] 0000000 123456 [id number]  Diastolic BP 82 84 88 82 80 78 82  Wt. (Lbs)   161 161  161 155  BMI   30.42 kg/m2 30.42 kg/m2  30.42 kg/m2 29.77 kg/m2

## 2022-11-06 NOTE — Assessment & Plan Note (Signed)
  Patient re-educated about  the importance of commitment to a  minimum of 150 minutes of exercise per week as able.  The importance of healthy food choices with portion control discussed, as well as eating regularly and within a 12 hour window most days. The need to choose "clean , green" food 50 to 75% of the time is discussed, as well as to make water the primary drink and set a goal of 64 ounces water daily.       11/06/2022    2:16 PM 05/30/2022    3:41 PM 05/08/2022    2:21 PM  Weight /BMI  Weight 161 lb 161 lb 161 lb  Height 5\' 1"  (1.549 m) 5\' 1"  (1.549 m) 5\' 1"  (1.549 m)  BMI 30.42 kg/m2 30.42 kg/m2 30.42 kg/m2    Unchanged

## 2022-11-07 LAB — LIPID PANEL
Chol/HDL Ratio: 3.1 ratio (ref 0.0–4.4)
Cholesterol, Total: 154 mg/dL (ref 100–199)
HDL: 49 mg/dL (ref >39)
LDL Chol Calc (NIH): 84 mg/dL (ref 0–99)
Triglycerides: 116 mg/dL (ref 0–149)
VLDL Cholesterol Cal: 21 mg/dL (ref 5–40)

## 2022-11-07 LAB — CBC
Hematocrit: 39.2 % (ref 34.0–46.6)
Hemoglobin: 12.8 g/dL (ref 11.1–15.9)
MCH: 29.2 pg (ref 26.6–33.0)
MCHC: 32.7 g/dL (ref 31.5–35.7)
MCV: 89 fL (ref 79–97)
Platelets: 237 10*3/uL (ref 150–450)
RBC: 4.39 x10E6/uL (ref 3.77–5.28)
RDW: 13.6 % (ref 11.7–15.4)
WBC: 4.3 10*3/uL (ref 3.4–10.8)

## 2022-11-07 LAB — CMP14+EGFR
ALT: 19 IU/L (ref 0–32)
AST: 30 IU/L (ref 0–40)
Albumin/Globulin Ratio: 1.1 — ABNORMAL LOW (ref 1.2–2.2)
Albumin: 4 g/dL (ref 3.8–4.8)
Alkaline Phosphatase: 87 IU/L (ref 44–121)
BUN/Creatinine Ratio: 16 (ref 12–28)
BUN: 14 mg/dL (ref 8–27)
Bilirubin Total: 0.4 mg/dL (ref 0.0–1.2)
CO2: 25 mmol/L (ref 20–29)
Calcium: 9.2 mg/dL (ref 8.7–10.3)
Chloride: 102 mmol/L (ref 96–106)
Creatinine, Ser: 0.9 mg/dL (ref 0.57–1.00)
Globulin, Total: 3.7 g/dL (ref 1.5–4.5)
Glucose: 82 mg/dL (ref 70–99)
Potassium: 3.4 mmol/L — ABNORMAL LOW (ref 3.5–5.2)
Sodium: 142 mmol/L (ref 134–144)
Total Protein: 7.7 g/dL (ref 6.0–8.5)
eGFR: 67 mL/min/{1.73_m2} (ref >59)

## 2022-11-07 LAB — TSH: TSH: 1.8 u[IU]/mL (ref 0.450–4.500)

## 2022-11-07 LAB — VITAMIN D 25 HYDROXY (VIT D DEFICIENCY, FRACTURES): Vit D, 25-Hydroxy: 40.8 ng/mL (ref 30.0–100.0)

## 2022-11-13 ENCOUNTER — Ambulatory Visit (INDEPENDENT_AMBULATORY_CARE_PROVIDER_SITE_OTHER): Payer: Medicare Other | Admitting: Orthopaedic Surgery

## 2022-11-13 ENCOUNTER — Encounter: Payer: Self-pay | Admitting: Orthopaedic Surgery

## 2022-11-13 DIAGNOSIS — M7062 Trochanteric bursitis, left hip: Secondary | ICD-10-CM

## 2022-11-13 MED ORDER — HYDROCODONE-ACETAMINOPHEN 5-325 MG PO TABS: ORAL_TABLET | ORAL | 0 refills | Status: DC

## 2022-11-13 MED ORDER — METHYLPREDNISOLONE ACETATE 40 MG/ML IJ SUSP
40.0000 mg | Freq: Once | INTRAMUSCULAR | Status: AC
  Administered 2022-11-13: 40 mg via INTRA_ARTICULAR

## 2022-11-13 NOTE — Progress Notes (Signed)
PROCEDURE NOTE:  The patient request injection, verbal consent was obtained.  The left trochanteric area of the hip was prepped appropriately after time out was performed.   Sterile technique was observed and injection of 1 cc of DepoMedrol 40 mg with several cc's of plain xylocaine. Anesthesia was provided by ethyl chloride and a 20-gauge needle was used to inject the hip area. The injection was tolerated well.  A band aid dressing was applied.  The patient was advised to apply ice later today and tomorrow to the injection sight as needed.  Encounter Diagnosis  Name Primary?   Trochanteric bursitis, left hip Yes   Return in one month.  I have reviewed the [place] web site prior to prescribing narcotic medicine for this patient.  Call if any problem.  Precautions discussed.  Electronically Signed Sanjuana Kava, MD 3/26/20243:27 PM

## 2022-11-22 ENCOUNTER — Other Ambulatory Visit: Payer: Self-pay | Admitting: Family Medicine

## 2022-12-04 ENCOUNTER — Encounter (HOSPITAL_COMMUNITY): Payer: Medicare Other

## 2022-12-04 ENCOUNTER — Ambulatory Visit (HOSPITAL_COMMUNITY): Payer: Medicare Other

## 2022-12-11 ENCOUNTER — Ambulatory Visit (INDEPENDENT_AMBULATORY_CARE_PROVIDER_SITE_OTHER): Payer: Medicare Other | Admitting: Orthopaedic Surgery

## 2022-12-11 ENCOUNTER — Encounter: Payer: Self-pay | Admitting: Orthopaedic Surgery

## 2022-12-11 VITALS — BP 157/98 | HR 99

## 2022-12-11 DIAGNOSIS — M7062 Trochanteric bursitis, left hip: Secondary | ICD-10-CM | POA: Diagnosis not present

## 2022-12-11 MED ORDER — METHYLPREDNISOLONE ACETATE 40 MG/ML IJ SUSP
40.0000 mg | Freq: Once | INTRAMUSCULAR | Status: AC
Start: 2022-12-11 — End: 2022-12-11
  Administered 2022-12-11: 40 mg via INTRA_ARTICULAR

## 2022-12-11 NOTE — Progress Notes (Signed)
PROCEDURE NOTE:  The patient request injection, verbal consent was obtained.  The left trochanteric area of the hip was prepped appropriately after time out was performed.   Sterile technique was observed and injection of 1 cc of DepoMedrol 40 mg with several cc's of plain xylocaine. Anesthesia was provided by ethyl chloride and a 20-gauge needle was used to inject the hip area. The injection was tolerated well.  A band aid dressing was applied.  The patient was advised to apply ice later today and tomorrow to the injection sight as needed.  Encounter Diagnosis  Name Primary?   Trochanteric bursitis, left hip Yes   Call if any problem.  Precautions discussed.  Electronically Signed Darreld Mclean, MD 4/23/20242:33 PM

## 2022-12-11 NOTE — Addendum Note (Signed)
Addended by: Recardo Evangelist A on: 12/11/2022 02:39 PM   Modules accepted: Orders

## 2022-12-25 ENCOUNTER — Encounter (HOSPITAL_COMMUNITY): Payer: Self-pay

## 2022-12-25 ENCOUNTER — Ambulatory Visit (HOSPITAL_COMMUNITY)
Admission: RE | Admit: 2022-12-25 | Discharge: 2022-12-25 | Disposition: A | Discharge status: Home / Self Care | Payer: Medicare Other | Source: Ambulatory Visit | Attending: Family Medicine | Admitting: Family Medicine

## 2022-12-25 DIAGNOSIS — R928 Other abnormal and inconclusive findings on diagnostic imaging of breast: Secondary | ICD-10-CM

## 2022-12-25 DIAGNOSIS — R92333 Mammographic heterogeneous density, bilateral breasts: Secondary | ICD-10-CM | POA: Diagnosis not present

## 2022-12-27 ENCOUNTER — Other Ambulatory Visit: Payer: Self-pay | Admitting: Family Medicine

## 2023-01-08 ENCOUNTER — Encounter: Payer: Self-pay | Admitting: Orthopaedic Surgery

## 2023-01-08 ENCOUNTER — Ambulatory Visit (INDEPENDENT_AMBULATORY_CARE_PROVIDER_SITE_OTHER): Payer: Medicare Other | Admitting: Orthopaedic Surgery

## 2023-01-08 DIAGNOSIS — M7062 Trochanteric bursitis, left hip: Secondary | ICD-10-CM

## 2023-01-08 DIAGNOSIS — M545 Low back pain, unspecified: Secondary | ICD-10-CM | POA: Diagnosis not present

## 2023-01-08 MED ORDER — HYDROCODONE-ACETAMINOPHEN 5-325 MG PO TABS: ORAL_TABLET | ORAL | 0 refills | Status: DC

## 2023-01-08 NOTE — Progress Notes (Signed)
My hip is hurting at times.  She has left hip pain and paresthesias from the left lower back to the lower left leg. She has chronic edema of the lower extremities. She is not wearing her support hose today.  She has no new trauma.  Pain medicine controls her pain.  She has no redness, no weakness.  Left hip is slightly tender over the lateral trochanteric area.  Lower back is tender on the left but has no spasm.  Muscle tone and strength are normal.  NV intact.  Encounter Diagnoses  Name Primary?   Trochanteric bursitis, left hip Yes   Lumbar pain    I have refilled her pain medicine.  I have reviewed the [place] Controlled Substance Reporting System web site prior to prescribing narcotic medicine for this patient.  Return in one month.  Call if any problem.  Precautions discussed.  Electronically Signed Darreld Mclean, MD 5/21/20242:25 PM

## 2023-01-26 ENCOUNTER — Other Ambulatory Visit: Payer: Self-pay | Admitting: Family Medicine

## 2023-02-12 ENCOUNTER — Encounter: Payer: Self-pay | Admitting: Orthopaedic Surgery

## 2023-02-12 ENCOUNTER — Ambulatory Visit (INDEPENDENT_AMBULATORY_CARE_PROVIDER_SITE_OTHER): Payer: Medicare Other | Admitting: Orthopaedic Surgery

## 2023-02-12 VITALS — BP 126/85 | HR 90 | Ht 61.0 in | Wt 161.0 lb

## 2023-02-12 DIAGNOSIS — M7062 Trochanteric bursitis, left hip: Secondary | ICD-10-CM | POA: Diagnosis not present

## 2023-02-12 MED ORDER — HYDROCODONE-ACETAMINOPHEN 5-325 MG PO TABS: ORAL_TABLET | ORAL | 0 refills | Status: DC

## 2023-02-12 NOTE — Progress Notes (Signed)
My hip is tender but not a lot  She has trochanteric bursitis of the left hip which is better today. She has no new trauma.  She has no redness or numbness.  Left hip has good ROM, NV intact.  It is tender over the lateral trochanteric area.  Encounter Diagnosis  Name Primary?   Trochanteric bursitis, left hip Yes   I will see her in six weeks.  I have reviewed the [place] Controlled Substance Reporting System web site prior to prescribing narcotic medicine for this patient.  Call if any problem.  Precautions discussed.  Electronically Signed Darreld Mclean, MD 6/25/20242:37 PM

## 2023-02-22 ENCOUNTER — Other Ambulatory Visit: Payer: Self-pay | Admitting: Family Medicine

## 2023-03-22 ENCOUNTER — Other Ambulatory Visit: Payer: Self-pay | Admitting: Family Medicine

## 2023-03-26 ENCOUNTER — Emergency Department (HOSPITAL_COMMUNITY): Payer: Medicare Other

## 2023-03-26 ENCOUNTER — Other Ambulatory Visit: Payer: Self-pay

## 2023-03-26 ENCOUNTER — Emergency Department (HOSPITAL_COMMUNITY)
Admission: EM | Admit: 2023-03-26 | Discharge: 2023-03-26 | Disposition: A | Discharge status: Home / Self Care | Payer: Medicare Other | Attending: Emergency Medicine | Admitting: Emergency Medicine

## 2023-03-26 ENCOUNTER — Encounter (HOSPITAL_COMMUNITY): Payer: Self-pay | Admitting: Emergency Medicine

## 2023-03-26 ENCOUNTER — Ambulatory Visit (INDEPENDENT_AMBULATORY_CARE_PROVIDER_SITE_OTHER): Payer: Medicare Other | Admitting: Orthopaedic Surgery

## 2023-03-26 ENCOUNTER — Encounter: Payer: Self-pay | Admitting: Orthopaedic Surgery

## 2023-03-26 VITALS — BP 123/79 | HR 86

## 2023-03-26 DIAGNOSIS — Y92510 Bank as the place of occurrence of the external cause: Secondary | ICD-10-CM | POA: Diagnosis not present

## 2023-03-26 DIAGNOSIS — M7062 Trochanteric bursitis, left hip: Secondary | ICD-10-CM | POA: Diagnosis not present

## 2023-03-26 DIAGNOSIS — W01198A Fall on same level from slipping, tripping and stumbling with subsequent striking against other object, initial encounter: Secondary | ICD-10-CM | POA: Diagnosis not present

## 2023-03-26 DIAGNOSIS — G9600 Cerebrospinal fluid leak, unspecified: Secondary | ICD-10-CM | POA: Diagnosis not present

## 2023-03-26 DIAGNOSIS — I10 Essential (primary) hypertension: Secondary | ICD-10-CM | POA: Insufficient documentation

## 2023-03-26 DIAGNOSIS — E876 Hypokalemia: Secondary | ICD-10-CM | POA: Insufficient documentation

## 2023-03-26 DIAGNOSIS — Y9301 Activity, walking, marching and hiking: Secondary | ICD-10-CM | POA: Insufficient documentation

## 2023-03-26 DIAGNOSIS — S0990XA Unspecified injury of head, initial encounter: Secondary | ICD-10-CM | POA: Diagnosis not present

## 2023-03-26 DIAGNOSIS — Z043 Encounter for examination and observation following other accident: Secondary | ICD-10-CM | POA: Diagnosis not present

## 2023-03-26 DIAGNOSIS — S069X9A Unspecified intracranial injury with loss of consciousness of unspecified duration, initial encounter: Secondary | ICD-10-CM | POA: Diagnosis not present

## 2023-03-26 DIAGNOSIS — R42 Dizziness and giddiness: Secondary | ICD-10-CM

## 2023-03-26 DIAGNOSIS — Z79899 Other long term (current) drug therapy: Secondary | ICD-10-CM | POA: Insufficient documentation

## 2023-03-26 LAB — URINALYSIS, ROUTINE W REFLEX MICROSCOPIC
Bilirubin Urine: NEGATIVE
Glucose, UA: NEGATIVE mg/dL
Hgb urine dipstick: NEGATIVE
Ketones, ur: 5 mg/dL — AB
Nitrite: NEGATIVE
Protein, ur: 30 mg/dL — AB
Specific Gravity, Urine: 1.01 (ref 1.005–1.030)
pH: 6 (ref 5.0–8.0)

## 2023-03-26 LAB — BASIC METABOLIC PANEL
Anion gap: 11 (ref 5–15)
BUN: 11 mg/dL (ref 8–23)
CO2: 28 mmol/L (ref 22–32)
Calcium: 9.8 mg/dL (ref 8.9–10.3)
Chloride: 100 mmol/L (ref 98–111)
Creatinine, Ser: 0.82 mg/dL (ref 0.44–1.00)
GFR, Estimated: >60 mL/min (ref >60)
Glucose, Bld: 105 mg/dL — ABNORMAL HIGH (ref 70–99)
Potassium: 3.3 mmol/L — ABNORMAL LOW (ref 3.5–5.1)
Sodium: 139 mmol/L (ref 135–145)

## 2023-03-26 LAB — TROPONIN I (HIGH SENSITIVITY): Troponin I (High Sensitivity): 5 ng/L (ref <18)

## 2023-03-26 LAB — CBC
HCT: 38.1 % (ref 36.0–46.0)
Hemoglobin: 12.8 g/dL (ref 12.0–15.0)
MCH: 30 pg (ref 26.0–34.0)
MCHC: 33.6 g/dL (ref 30.0–36.0)
MCV: 89.4 fL (ref 80.0–100.0)
Platelets: 216 10*3/uL (ref 150–400)
RBC: 4.26 MIL/uL (ref 3.87–5.11)
RDW: 13.2 % (ref 11.5–15.5)
WBC: 4.7 10*3/uL (ref 4.0–10.5)
nRBC: 0 % (ref 0.0–0.2)

## 2023-03-26 LAB — CBG MONITORING, ED: Glucose-Capillary: 71 mg/dL (ref 70–99)

## 2023-03-26 MED ORDER — POTASSIUM CHLORIDE CRYS ER 20 MEQ PO TBCR
20.0000 meq | EXTENDED_RELEASE_TABLET | Freq: Once | ORAL | Status: AC
  Administered 2023-03-26: 20 meq via ORAL
  Filled 2023-03-26: qty 1

## 2023-03-26 NOTE — Progress Notes (Signed)
PROCEDURE NOTE:  The patient request injection, verbal consent was obtained.  The left trochanteric area of the hip was prepped appropriately after time out was performed.   Sterile technique was observed and injection of 1 cc of DepoMedrol 40 mg with several cc's of plain xylocaine. Anesthesia was provided by ethyl chloride and a 20-gauge needle was used to inject the hip area. The injection was tolerated well.  A band aid dressing was applied.  The patient was advised to apply ice later today and tomorrow to the injection sight as needed.  Encounter Diagnosis  Name Primary?   Trochanteric bursitis, left hip Yes   Return prn.  Call if any problem.  Precautions discussed.  Electronically Signed Darreld Mclean, MD 8/6/20243:20 PM

## 2023-03-26 NOTE — Discharge Instructions (Signed)
Pleasure taking care of you today.  You were seen after you hit your help from a fall.  We did do blood work and EKG since you felt dizzy as well.  Your potassium was slightly low but otherwise everything was reassuring.  I am glad you are feeling better.  He may have some mild headaches over the next couple of days.  Make sure you rest.  Make sure you are eating and drinking well.  Since your potassium was slightly low he should eat foods high in potassium such as bananas, oranges or orange juice, cantaloupe or potatoes.  Gave you potassium by mouth here to get your potassium back to normal as well.  Follow-up closely with your primary care doctor if you have new or worsening symptoms come back to the ER right away.

## 2023-03-26 NOTE — ED Notes (Signed)
Pt placed on monitor and BGL checked per MD orders

## 2023-03-26 NOTE — ED Notes (Signed)
Pt stated she she feel today walking into bank Pt stated she got a Cortizone shot in her RIGHT hip this morning When she was walking her legs just gave out Pt did hit head Denies LOC

## 2023-03-26 NOTE — ED Provider Notes (Signed)
[place] EMERGENCY DEPARTMENT AT [facility] Provider Note   CSN: [id number] Arrival date & time: 03/26/23  1624     History  Chief Complaint  Patient presents with   Julia Stevens is a 77 y.o. female.  She has PMH of trochanteric bursitis of the left hip, peripheral vascular disease, hypertension.  Presents the ER today complaining of fall with head injury.  Daughter is at bedside helps with history.  Patient fell striking her right side of her head while walking to the bank today.  Daughter states that he had just gotten a steroid injection in her hip.  Patient reports she was walking and suddenly felt dizzy and lost her balance and fell down the right side.  No loss of consciousness, no syncope or chest pain.  She says she feels well laying in the bed now but when she tries to stand up she starts feeling dizzy again.  Denies any recent nausea vomiting or diarrhea.  No chest pain or shortness of breath.  No blurry vision.  States has been eating and drinking normally.   Fall       Home Medications Prior to Admission medications   Medication Sig Start Date End Date Taking? Authorizing Provider  acetaminophen (TYLENOL) 500 MG tablet Take one tablet by mouth once to twice daily for arthritis 09/15/21   Kerri Perches, MD  calcium carbonate (OS-CAL) 600 MG TABS tablet Take 600 mg by mouth 2 (two) times daily with a meal.    [provider]  diltiazem (CARDIZEM CD) 300 MG 24 hr capsule TAKE 1 CAPSULE BY MOUTH DAILY. 03/22/23   Kerri Perches, MD  hydrochlorothiazide (HYDRODIURIL) 25 MG tablet TAKE ONE TABLET BY MOUTH DAILY. 03/22/23   Kerri Perches, MD  HYDROcodone-acetaminophen (NORCO/VICODIN) 5-325 MG tablet One tablet every six hours for pain.  Limit 7 days. 02/12/23   Darreld Mclean, MD  ibuprofen (ADVIL) 200 MG tablet Take 600 mg by mouth every 6 (six) hours as needed for headache.    [provider]  Multiple Vitamin (MULTIVITAMIN)  capsule Take 1 capsule by mouth daily.    [provider]  nortriptyline (PAMELOR) 50 MG capsule TAKE 1 CAPSULE BY MOUTH AT BEDTIME. 03/22/23   Kerri Perches, MD  potassium chloride (KLOR-CON) 10 MEQ tablet Take 1 tablet (10 mEq total) by mouth 2 (two) times daily. 05/08/22   Kerri Perches, MD  rosuvastatin (CRESTOR) 10 MG tablet TAKE ONE TABLET BY MOUTH ONCE DAILY. 02/22/23   Kerri Perches, MD  triamcinolone cream (KENALOG) 0.1 % APPLY A SMALL AMOUNT TO THE AFFECTED AREA TWICE ADAY AS NEEDED. 06/19/22   Kerri Perches, MD      Allergies    Ace inhibitors, Ciprofloxacin, Hydrocodone, and Penicillins    Review of Systems   Review of Systems  Physical Exam Updated Vital Signs BP (!) 141/83   Pulse 68   Temp 98 F (36.7 C) (Oral)   Resp 18   Ht 5\' 6"  (1.676 m)   Wt 59 kg   SpO2 99%   BMI 20.98 kg/m  Physical Exam Vitals and nursing note reviewed.  Constitutional:      General: She is not in acute distress.    Appearance: She is well-developed.  HENT:     Head: Normocephalic and atraumatic.     Mouth/Throat:     Mouth: Mucous membranes are moist.  Eyes:     Conjunctiva/sclera: Conjunctivae normal.  Cardiovascular:     Rate and Rhythm: Normal rate and regular rhythm.     Heart sounds: No murmur heard. Pulmonary:     Effort: Pulmonary effort is normal. No respiratory distress.     Breath sounds: Normal breath sounds.  Abdominal:     Palpations: Abdomen is soft.     Tenderness: There is no abdominal tenderness.  Musculoskeletal:        General: No swelling.     Cervical back: Neck supple.  Skin:    General: Skin is warm and dry.     Capillary Refill: Capillary refill takes less than 2 seconds.  Neurological:     General: No focal deficit present.     Mental Status: She is alert and oriented to person, place, and time.     Sensory: No sensory deficit.     Motor: No weakness.  Psychiatric:        Mood and Affect: Mood normal.         Behavior: Behavior normal.        Thought Content: Thought content normal.     ED Results / Procedures / Treatments   Labs (all labs ordered are listed, but only abnormal results are displayed) Labs Reviewed  BASIC METABOLIC PANEL - Abnormal; Notable for the following components:      Result Value   Potassium 3.3 (*)    Glucose, Bld 105 (*)    All other components within normal limits  URINALYSIS, ROUTINE W REFLEX MICROSCOPIC - Abnormal; Notable for the following components:   Ketones, ur 5 (*)    Protein, ur 30 (*)    Leukocytes,Ua SMALL (*)    Bacteria, UA RARE (*)    All other components within normal limits  CBC  CBG MONITORING, ED  TROPONIN I (HIGH SENSITIVITY)    EKG EKG Interpretation Date/Time:  Tuesday March 26 2023 17:12:11 EDT Ventricular Rate:  72 PR Interval:  202 QRS Duration:  76 QT Interval:  416 QTC Calculation: 455 R Axis:   -12  Text Interpretation: Normal sinus rhythm Low voltage QRS Borderline ECG When compared with ECG of 29-Nov-2020 02:32, PREVIOUS ECG IS PRESENT since last tracing no significant change Confirmed by Rolan Bucco [phone]) on 03/27/2023 8:37:26 AM  Radiology CT Cervical Spine Wo Contrast  Result Date: 03/26/2023 CLINICAL DATA:  Fall EXAM: CT CERVICAL SPINE WITHOUT CONTRAST TECHNIQUE: Multidetector CT imaging of the cervical spine was performed without intravenous contrast. Multiplanar CT image reconstructions were also generated. RADIATION DOSE REDUCTION: This exam was performed according to the departmental dose-optimization program which includes automated exposure control, adjustment of the mA and/or kV according to patient size and/or use of iterative reconstruction technique. COMPARISON:  None Available. FINDINGS: Motion degraded images. Alignment: Normal cervical lordosis. Skull base and vertebrae: No acute fracture. No primary bone lesion or focal pathologic process. Soft tissues and spinal canal: No prevertebral fluid or swelling.  No visible canal hematoma. Disc levels: Intervertebral disc spaces are maintained. Spinal canal is patent. Upper chest: Visualized lung apices are clear. Other: Visualized thyroid is unremarkable. IMPRESSION: Motion degraded images. No traumatic injury to the cervical spine. Electronically Signed   By: Charline Bills M.D.   On: 03/26/2023 19:23   CT Head Wo Contrast  Result Date: 03/26/2023 CLINICAL DATA:  Fall, loss of consciousness, head trauma EXAM: CT HEAD WITHOUT CONTRAST TECHNIQUE: Contiguous axial images were obtained from the base of the skull through the vertex without intravenous contrast. RADIATION DOSE REDUCTION: This exam was performed  according to the departmental dose-optimization program which includes automated exposure control, adjustment of the mA and/or kV according to patient size and/or use of iterative reconstruction technique. COMPARISON:  03/25/2020 FINDINGS: Brain: No acute intracranial abnormality. Specifically, no hemorrhage, hydrocephalus, mass lesion, acute infarction, or significant intracranial injury. Subdural hygroma again noted under the left side of the tentorium measuring 7 mm in thickness, unchanged since prior study. Vascular: No hyperdense vessel or unexpected calcification. Skull: No acute calvarial abnormality. Sinuses/Orbits: No acute findings Other: None IMPRESSION: No acute intracranial abnormality. Electronically Signed   By: Charlett Nose M.D.   On: 03/26/2023 18:08    Procedures Procedures    Medications Ordered in ED Medications  potassium chloride SA (KLOR-CON M) CR tablet 20 mEq (20 mEq Oral Given 03/26/23 2124)    ED Course/ Medical Decision Making/ A&P                                 Medical Decision Making This patient presents to the ED for concern of fall, head injury, this involves an extensive number of treatment options, and is a complaint that carries with it a high risk of complications and morbidity.  The differential diagnosis includes  syncope, presyncope, hypotension, mechanical fall, intracranial hemorrhage, other   Co morbidities that complicate the patient evaluation  Bursitis of left hip   Additional history obtained:  Additional history obtained from EMR External records from outside source obtained and reviewed including notes   Lab Tests:  I Ordered, and personally interpreted labs.  The pertinent results include: CBC, BMP, troponin, urinalysis, troponin all reassuring.  Potassium slightly low 3.3 which is repleted orally   Imaging Studies ordered:  I ordered imaging studies including CT head and C-spine I independently visualized and interpreted imaging which showed no acute intracranial hemorrhage, no C-spine fractures or traumatic malalignment I agree with the radiologist interpretation     Problem List / ED Course / Critical interventions / Medication management  Patient had fall initially was describing a mechanical fall of losing her balance when she stepped up on a curb to the triage nurse but tells me she became weak and dizzy while walking and this is what caused her to fall down.  Patient is at the most consistent historian but due to her age and abundance of caution we obtain some labs and EKG to ensure there is no underlying pathology that caused her fall.  She was mildly hypokalemic which was repleted otherwise normal, she states she is feeling better no longer feeling any dizziness.  CT head and C-spine were reassuring.  Patient felt better and was wanting to be discharged.  Advised on follow-up and return precautions.  She is not on blood thinners.  Courage on oral potassium intake at home with diet I have reviewed the patients home medicines and have made adjustments as needed     Amount and/or Complexity of Data Reviewed Labs: ordered. Radiology: ordered.  Risk Prescription drug management.           Final Clinical Impression(s) / ED Diagnoses Final diagnoses:  Injury  of head, initial encounter  Dizziness, nonspecific  Hypokalemia    Rx / DC Orders ED Discharge Orders     None         Ma Rings, PA-C 03/27/23 0931    Derwood Kaplan, MD 03/30/23 [phone]

## 2023-03-26 NOTE — ED Triage Notes (Signed)
Pt fell today getting out of car at bank, and lost footing striking head, denies LOC.

## 2023-04-02 ENCOUNTER — Telehealth: Payer: Self-pay

## 2023-04-02 NOTE — Telephone Encounter (Signed)
Transition Care Management Unsuccessful Follow-up Telephone Call  Date of discharge and from where:  [facility]  Attempts:  1st Attempt  Reason for unsuccessful TCM follow-up call:  No answer/busy   Lenard Forth [facility] Guide, [facility] [phone] [address] [place], [place], [place] [address] Phone: [phone] Email: [email or web address]

## 2023-04-03 ENCOUNTER — Telehealth: Payer: Self-pay

## 2023-04-03 NOTE — Telephone Encounter (Signed)
Transition Care Management Unsuccessful Follow-up Telephone Call  Date of discharge and from where:  [facility]  Attempts:  2nd Attempt  Reason for unsuccessful TCM follow-up call:  No answer/busy   Lenard Forth [facility] Guide, [facility] [phone] [address] [place], [place], [place] [address] Phone: [phone] Email: [email or web address]

## 2023-04-25 ENCOUNTER — Other Ambulatory Visit: Payer: Self-pay | Admitting: Family Medicine

## 2023-05-07 ENCOUNTER — Encounter: Payer: Self-pay | Admitting: Family Medicine

## 2023-05-07 ENCOUNTER — Ambulatory Visit (INDEPENDENT_AMBULATORY_CARE_PROVIDER_SITE_OTHER): Payer: Medicare Other | Admitting: Family Medicine

## 2023-05-07 VITALS — BP 113/67 | HR 85 | Ht 61.0 in

## 2023-05-07 DIAGNOSIS — Z23 Encounter for immunization: Secondary | ICD-10-CM

## 2023-05-07 DIAGNOSIS — L309 Dermatitis, unspecified: Secondary | ICD-10-CM | POA: Diagnosis not present

## 2023-05-07 DIAGNOSIS — M5442 Lumbago with sciatica, left side: Secondary | ICD-10-CM | POA: Diagnosis not present

## 2023-05-07 DIAGNOSIS — M7062 Trochanteric bursitis, left hip: Secondary | ICD-10-CM | POA: Diagnosis not present

## 2023-05-07 DIAGNOSIS — I10 Essential (primary) hypertension: Secondary | ICD-10-CM

## 2023-05-07 DIAGNOSIS — G8929 Other chronic pain: Secondary | ICD-10-CM | POA: Diagnosis not present

## 2023-05-07 DIAGNOSIS — E785 Hyperlipidemia, unspecified: Secondary | ICD-10-CM

## 2023-05-07 DIAGNOSIS — F29 Unspecified psychosis not due to a substance or known physiological condition: Secondary | ICD-10-CM | POA: Diagnosis not present

## 2023-05-07 NOTE — Patient Instructions (Addendum)
F/U in 5 months, call if you need me sooner  Flu vaccine today  Chem 7 today  Script for 3 pronged cane to be sent by nurse today  Nurse Pls order compression hose , knee high , 15 to 19 mm HG dx is leg swelling  Need to follow through on recommendation for MRI per Dr Elisabeth Pigeon are referred for X ray of your low back , I believe that left groin pain is from arthritis in your low back

## 2023-05-07 NOTE — Assessment & Plan Note (Signed)
X ray lumbar spine

## 2023-05-07 NOTE — Assessment & Plan Note (Signed)
Controlled, no change in medication  

## 2023-05-07 NOTE — Assessment & Plan Note (Signed)
Hyperlipidemia:Low fat diet discussed and encouraged.   Lipid Panel  Lab Results  Component Value Date   CHOL 154 11/06/2022   HDL 49 11/06/2022   LDLCALC 84 11/06/2022   TRIG 116 11/06/2022   CHOLHDL 3.1 11/06/2022     Updated lab needed at/ before next visit.

## 2023-05-07 NOTE — Assessment & Plan Note (Signed)
Controlled, no change in medication

## 2023-05-07 NOTE — Assessment & Plan Note (Signed)
Managed by ortho I encourage her to follow up with imaging proposed and keep appts with ortho RX for 3 pronged walker / cane

## 2023-05-07 NOTE — Progress Notes (Signed)
Julia Stevens     MRN: [id number]      DOB: [birth year]  Chief Complaint  Patient presents with   Follow-up    Follow up discuss falls, recent in ER from fall, pain in knee and thigh requesting 3 prong walker     HPI Julia Stevens is here for follow up and re-evaluation of chronic medical conditions, medication management and review of any available recent lab and radiology data.  Experiencing significant left hip [pain, dx with and treated for left hip bursitis by ortho, MRI was recommended by Ortho but she has not yet followed up and I recommend she does due to extent of her limitations C/o right groin pain , x 3 weeks High fall risk with reduced mobility due to arthritis , requests and will benefit from 3 pronged cane / walker as hjer git is unsteady and imbalanced , needs it for use in the house   ROS Denies recent fever or chills. Denies sinus pressure,  c/o nasal congestion, denies ear pain or sore throat. Denies chest congestion, productive cough or wheezing. Denies chest pains, palpitations and leg swelling Denies abdominal pain, nausea, vomiting,diarrhea or constipation.   Denies dysuria, frequency, hesitancy or incontinence.  Denies depression, anxiety or insomnia. Denies skin break down or rash.   PE  BP 113/67 (BP Location: Right Arm, Patient Position: Sitting, Cuff Size: Large)   Pulse 85   Ht 5\' 1"  (1.549 m)   SpO2 92%   BMI 24.56 kg/m   Patient alert and oriented and in no cardiopulmonary distress.  HEENT: No facial asymmetry, EOMI,     Neck decreased ROM .  Chest: Clear to auscultation bilaterally.  CVS: S1, S2 no murmurs, no S3.Regular rate.  ABD: Soft non tender.   Ext: No edema  MS: Markedly decreased ROM spine, shoulders, hips and knees.Marked deformity of digits  Skin: Intact, no ulcerations or rash noted.  Psych: Good eye contact, normal affect. Memory intact not anxious or depressed appearing.  CNS: CN 2-12 intact, power,  normal  throughout.no focal deficits noted.   Assessment & Plan  HTN, goal below 140/80 Controlled, no change in medication   Hyperlipidemia LDL goal <100 Hyperlipidemia:Low fat diet discussed and encouraged.   Lipid Panel  Lab Results  Component Value Date   CHOL 154 11/06/2022   HDL 49 11/06/2022   LDLCALC 84 11/06/2022   TRIG 116 11/06/2022   CHOLHDL 3.1 11/06/2022     Updated lab needed at/ before next visit.   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

## 2023-05-23 ENCOUNTER — Other Ambulatory Visit: Payer: Self-pay

## 2023-05-23 MED ORDER — DILTIAZEM HCL ER COATED BEADS 300 MG PO CP24: 300.0000 mg | ORAL_CAPSULE | Freq: Every day | ORAL | 2 refills | Status: DC

## 2023-05-23 MED ORDER — HYDROCHLOROTHIAZIDE 25 MG PO TABS: 25.0000 mg | ORAL_TABLET | Freq: Every day | ORAL | 2 refills | Status: DC

## 2023-05-23 MED ORDER — NORTRIPTYLINE HCL 50 MG PO CAPS: 50.0000 mg | ORAL_CAPSULE | Freq: Every day | ORAL | 3 refills | Status: DC

## 2023-06-27 ENCOUNTER — Other Ambulatory Visit: Payer: Self-pay

## 2023-06-27 MED ORDER — ROSUVASTATIN CALCIUM 10 MG PO TABS: 10.0000 mg | ORAL_TABLET | Freq: Every day | ORAL | 0 refills | Status: DC

## 2023-07-23 ENCOUNTER — Other Ambulatory Visit: Payer: Self-pay

## 2023-07-25 ENCOUNTER — Other Ambulatory Visit: Payer: Self-pay | Admitting: Orthopaedic Surgery

## 2023-08-02 ENCOUNTER — Telehealth: Payer: Self-pay

## 2023-08-02 NOTE — Telephone Encounter (Signed)
Copied from CRM [id number]. Topic: General - Other >> Aug 02, 2023  9:41 AM Colletta Maryland S wrote: Reason for CRM: Pt called in stating she needs a prescription for a walker faxed to [facility], pt is unaware of fax number    Ok to send ?

## 2023-08-02 NOTE — Telephone Encounter (Signed)
Patient calling to follow up on status of the Rx for her walker. Please advise Thank you!

## 2023-08-08 ENCOUNTER — Other Ambulatory Visit: Payer: Self-pay

## 2023-08-08 MED ORDER — UNABLE TO FIND: 0 refills | Status: AC

## 2023-08-08 NOTE — Telephone Encounter (Signed)
Rx sent to [facility] 

## 2023-08-13 ENCOUNTER — Other Ambulatory Visit: Payer: Self-pay | Admitting: Family Medicine

## 2023-09-18 ENCOUNTER — Other Ambulatory Visit: Payer: Self-pay | Admitting: Family Medicine

## 2023-10-08 ENCOUNTER — Ambulatory Visit: Payer: Medicare Other | Admitting: Family Medicine

## 2023-10-08 ENCOUNTER — Encounter: Payer: Self-pay | Admitting: Family Medicine

## 2023-10-08 VITALS — BP 157/90 | HR 101 | Ht 61.0 in | Wt 142.1 lb

## 2023-10-08 DIAGNOSIS — E66811 Obesity, class 1: Secondary | ICD-10-CM | POA: Diagnosis not present

## 2023-10-08 DIAGNOSIS — R350 Frequency of micturition: Secondary | ICD-10-CM

## 2023-10-08 DIAGNOSIS — Z1231 Encounter for screening mammogram for malignant neoplasm of breast: Secondary | ICD-10-CM | POA: Diagnosis not present

## 2023-10-08 DIAGNOSIS — M130 Polyarthritis, unspecified: Secondary | ICD-10-CM | POA: Diagnosis not present

## 2023-10-08 DIAGNOSIS — F29 Unspecified psychosis not due to a substance or known physiological condition: Secondary | ICD-10-CM | POA: Diagnosis not present

## 2023-10-08 DIAGNOSIS — E559 Vitamin D deficiency, unspecified: Secondary | ICD-10-CM | POA: Diagnosis not present

## 2023-10-08 DIAGNOSIS — I10 Essential (primary) hypertension: Secondary | ICD-10-CM | POA: Diagnosis not present

## 2023-10-08 DIAGNOSIS — M25552 Pain in left hip: Secondary | ICD-10-CM | POA: Diagnosis not present

## 2023-10-08 DIAGNOSIS — E785 Hyperlipidemia, unspecified: Secondary | ICD-10-CM

## 2023-10-08 DIAGNOSIS — R059 Cough, unspecified: Secondary | ICD-10-CM

## 2023-10-08 NOTE — Patient Instructions (Addendum)
 F/U in 78 yo 9 weeks, call if you need me sooner  Pls schedule wellness at checkout  Please schedule mammogram at checkout  cXR and X ray of left hip are needed, pls get these done in the next 1 wek if unable to go today   Nurse pls ask pharmacy to pill pack her chronic meds states she often forgets her bp med, meds for pill packing ar cardizem, hydrochlorothiazide, potasium , crestor and palmelor  I have prescribed a 5 day course of prednisone for use if you have an arthritis flare  Urine to be checked in office if abn then send for  send for c/s  Labs today, cBC, cmp and EGFR, lipid panel , TSH and vit d and magnesium  Careful not to fall Thanks for choosing [facility], we consider it a privelige to serve you.

## 2023-10-09 LAB — URINALYSIS
Bilirubin, UA: NEGATIVE
Glucose, UA: NEGATIVE
Ketones, UA: NEGATIVE
Nitrite, UA: NEGATIVE
RBC, UA: NEGATIVE
Specific Gravity, UA: 1.02 (ref 1.005–1.030)
Urobilinogen, Ur: 0.2 mg/dL (ref 0.2–1.0)
pH, UA: 7 (ref 5.0–7.5)

## 2023-10-09 LAB — CBC
Hematocrit: 37 % (ref 34.0–46.6)
Hemoglobin: 12.6 g/dL (ref 11.1–15.9)
MCH: 30.2 pg (ref 26.6–33.0)
MCHC: 34.1 g/dL (ref 31.5–35.7)
MCV: 89 fL (ref 79–97)
Platelets: 270 10*3/uL (ref 150–450)
RBC: 4.17 x10E6/uL (ref 3.77–5.28)
RDW: 13.5 % (ref 11.7–15.4)
WBC: 6 10*3/uL (ref 3.4–10.8)

## 2023-10-09 LAB — CMP14+EGFR
ALT: 16 [IU]/L (ref 0–32)
AST: 33 [IU]/L (ref 0–40)
Albumin: 4.3 g/dL (ref 3.8–4.8)
Alkaline Phosphatase: 95 [IU]/L (ref 44–121)
BUN/Creatinine Ratio: 11 — ABNORMAL LOW (ref 12–28)
BUN: 10 mg/dL (ref 8–27)
Bilirubin Total: 0.3 mg/dL (ref 0.0–1.2)
CO2: 27 mmol/L (ref 20–29)
Calcium: 9.5 mg/dL (ref 8.7–10.3)
Chloride: 100 mmol/L (ref 96–106)
Creatinine, Ser: 0.87 mg/dL (ref 0.57–1.00)
Globulin, Total: 3.6 g/dL (ref 1.5–4.5)
Glucose: 83 mg/dL (ref 70–99)
Potassium: 3.1 mmol/L — ABNORMAL LOW (ref 3.5–5.2)
Sodium: 142 mmol/L (ref 134–144)
Total Protein: 7.9 g/dL (ref 6.0–8.5)
eGFR: 69 mL/min/{1.73_m2} (ref >59)

## 2023-10-09 LAB — VITAMIN D 25 HYDROXY (VIT D DEFICIENCY, FRACTURES): Vit D, 25-Hydroxy: 44.7 ng/mL (ref 30.0–100.0)

## 2023-10-09 LAB — LIPID PANEL
Chol/HDL Ratio: 2.7 {ratio} (ref 0.0–4.4)
Cholesterol, Total: 143 mg/dL (ref 100–199)
HDL: 53 mg/dL (ref >39)
LDL Chol Calc (NIH): 75 mg/dL (ref 0–99)
Triglycerides: 80 mg/dL (ref 0–149)
VLDL Cholesterol Cal: 15 mg/dL (ref 5–40)

## 2023-10-09 LAB — TSH: TSH: 3.47 u[IU]/mL (ref 0.450–4.500)

## 2023-10-09 LAB — MAGNESIUM: Magnesium: 2 mg/dL (ref 1.6–2.3)

## 2023-10-10 LAB — URINE CULTURE

## 2023-10-10 MED ORDER — POTASSIUM CHLORIDE CRYS ER 10 MEQ PO TBCR: 10.0000 meq | EXTENDED_RELEASE_TABLET | Freq: Two times a day (BID) | ORAL | 5 refills | Status: DC

## 2023-10-20 DIAGNOSIS — E559 Vitamin D deficiency, unspecified: Secondary | ICD-10-CM | POA: Insufficient documentation

## 2023-10-20 DIAGNOSIS — I10 Essential (primary) hypertension: Secondary | ICD-10-CM | POA: Insufficient documentation

## 2023-10-20 DIAGNOSIS — R059 Cough, unspecified: Secondary | ICD-10-CM | POA: Insufficient documentation

## 2023-10-20 DIAGNOSIS — R350 Frequency of micturition: Secondary | ICD-10-CM | POA: Insufficient documentation

## 2023-10-20 DIAGNOSIS — M1612 Unilateral primary osteoarthritis, left hip: Secondary | ICD-10-CM | POA: Insufficient documentation

## 2023-10-20 DIAGNOSIS — M25552 Pain in left hip: Secondary | ICD-10-CM | POA: Insufficient documentation

## 2023-10-20 NOTE — Assessment & Plan Note (Signed)
 Increased pain and ;limited ,mobility , x ray needed

## 2023-10-20 NOTE — Assessment & Plan Note (Signed)
 Stable on current med

## 2023-10-20 NOTE — Assessment & Plan Note (Signed)
 Hyperlipidemia:Low fat diet discussed and encouraged.   Lipid Panel  Lab Results  Component Value Date   CHOL 143 10/08/2023   HDL 53 10/08/2023   LDLCALC 75 10/08/2023   TRIG 80 10/08/2023   CHOLHDL 2.7 10/08/2023     Controlled, no change in medication

## 2023-10-20 NOTE — Assessment & Plan Note (Signed)
 Updated lab needed at/ before next visit.

## 2023-10-20 NOTE — Assessment & Plan Note (Signed)
 Intermittent flares , has rheumatoid , short course of prednisone prescribed for as needed use

## 2023-10-20 NOTE — Assessment & Plan Note (Signed)
 Reports chronic cough, exam normal, cxr to be done

## 2023-10-20 NOTE — Progress Notes (Signed)
 Julia Stevens     MRN: [id number]      DOB: 03-14-1946  Chief Complaint  Patient presents with   Follow-up    Follow up    HPI Julia Stevens is here for follow up and re-evaluation of chronic medical conditions, medication management and review of any available recent lab and radiology data.  Preventive health is updated, specifically  Cancer screening and Immunization.   Questions or concerns regarding consultations or procedures which the PT has had in the interim are  addressed. The PT denies any adverse reactions to current medications since the last visit.  Tc/o chronic cough for weeks and increased left hip and groin pain also, no falls but is unsteady Chronic arthritic pain affecting all fingers is unchanged, has intermittent flares which respond to short courseof prednisone, requests same  ROS Denies recent fever or chills. Denies sinus pressure, nasal congestion, ear pain or sore throat. Denies chest pains, palpitations and leg swelling Denies abdominal pain, nausea, vomiting,diarrhea or constipation.   C/o urinary frequency  Denies headaches, seizures, numbness, or tingling. Denies depression, anxiety or insomnia. Denies skin break down or rash.   PE  BP (!) 157/90 (BP Location: Left Arm, Patient Position: Sitting, Cuff Size: Large)   Pulse (!) 101   Ht 5\' 1"  (1.549 m)   Wt 142 lb 1.9 oz (64.5 kg)   SpO2 98%   BMI 26.85 kg/m   Patient alert and oriented and in no cardiopulmonary distress.  HEENT: No facial asymmetry, EOMI,     Neck supple .  Chest: Clear to auscultation bilaterally.  CVS: S1, S2 no murmurs, no S3.Regular rate.  ABD: Soft non tender.   Ext: No edema  WU:[id number] ROM spine, shoulders, hips and knees.  Skin: Intact, no ulcerations or rash noted.  Psych: Good eye contact, normal affect. Memory intact not anxious or depressed appearing.  CNS: CN 2-12 intact, power,  normal throughout.no focal deficits noted.   Assessment &  Plan  HTN, goal below 140/80 DASH diet and commitment to daily physical activity for a minimum of 30 minutes discussed and encouraged, as a part of hypertension management. The importance of attaining a healthy weight is also discussed.     10/08/2023    2:59 PM 10/08/2023    2:57 PM 05/07/2023    2:41 PM 03/26/2023   10:00 PM 03/26/2023    9:45 PM 03/26/2023    9:30 PM 03/26/2023    9:15 PM  BP/Weight  Systolic BP 157 155 113 141 138 152 142  Diastolic BP 90 93 67 83 78 87 82  Wt. (Lbs)  142.12 --      BMI  26.85 kg/m2          Elevated at visit, med copiance questionable , referred for pill packing, re eval in 6 to 8 weeks  Left hip pain Increased pain and ;limited ,mobility , x ray needed  Nonorganic psychosis Stable on current med  Hyperlipidemia LDL goal <100 Hyperlipidemia:Low fat diet discussed and encouraged.   Lipid Panel  Lab Results  Component Value Date   CHOL 143 10/08/2023   HDL 53 10/08/2023   LDLCALC 75 10/08/2023   TRIG 80 10/08/2023   CHOLHDL 2.7 10/08/2023     Controlled, no change in medication   Vitamin D deficiency Updated lab needed at/ before next visit.   Cough Reports chronic cough, exam normal, cxr to be done  Polyarthritis Intermittent flares , has rheumatoid , short course of prednisone prescribed  for as needed use  Frequent urination CCUA and c/s if abn has h/o recurrent UTI

## 2023-10-20 NOTE — Assessment & Plan Note (Signed)
 DASH diet and commitment to daily physical activity for a minimum of 30 minutes discussed and encouraged, as a part of hypertension management. The importance of attaining a healthy weight is also discussed.     10/08/2023    2:59 PM 10/08/2023    2:57 PM 05/07/2023    2:41 PM 03/26/2023   10:00 PM 03/26/2023    9:45 PM 03/26/2023    9:30 PM 03/26/2023    9:15 PM  BP/Weight  Systolic BP 157 155 113 141 138 152 142  Diastolic BP 90 93 67 83 78 87 82  Wt. (Lbs)  142.12 --      BMI  26.85 kg/m2          Elevated at visit, med copiance questionable , referred for pill packing, re eval in 6 to 8 weeks

## 2023-10-20 NOTE — Assessment & Plan Note (Signed)
 CCUA and c/s if abn has h/o recurrent UTI

## 2023-10-25 ENCOUNTER — Ambulatory Visit (HOSPITAL_COMMUNITY)
Admission: RE | Admit: 2023-10-25 | Discharge: 2023-10-25 | Disposition: A | Discharge status: Home / Self Care | Source: Ambulatory Visit | Attending: Family Medicine | Admitting: Family Medicine

## 2023-10-25 DIAGNOSIS — M25552 Pain in left hip: Secondary | ICD-10-CM | POA: Insufficient documentation

## 2023-10-25 DIAGNOSIS — R1030 Lower abdominal pain, unspecified: Secondary | ICD-10-CM | POA: Diagnosis not present

## 2023-10-25 DIAGNOSIS — I10 Essential (primary) hypertension: Secondary | ICD-10-CM | POA: Diagnosis not present

## 2023-10-25 DIAGNOSIS — G8929 Other chronic pain: Secondary | ICD-10-CM | POA: Diagnosis not present

## 2023-10-25 DIAGNOSIS — R059 Cough, unspecified: Secondary | ICD-10-CM | POA: Insufficient documentation

## 2023-10-25 DIAGNOSIS — M1612 Unilateral primary osteoarthritis, left hip: Secondary | ICD-10-CM | POA: Diagnosis not present

## 2023-10-30 ENCOUNTER — Telehealth: Payer: Self-pay

## 2023-10-30 NOTE — Telephone Encounter (Unsigned)
 Copied from CRM [id number]. Topic: Clinical - Lab/Test Results >> Oct 30, 2023  9:22 AM Marland Kitchen D wrote: Patient called in to get X-Ray results and would like a call back

## 2023-10-30 NOTE — Telephone Encounter (Signed)
 Xray results have not been read by radiology yet.

## 2023-10-31 ENCOUNTER — Telehealth: Payer: Self-pay

## 2023-10-31 NOTE — Telephone Encounter (Signed)
 Copied from CRM [id number]. Topic: Clinical - Lab/Test Results >> Oct 30, 2023  9:22 AM Marland Kitchen D wrote: Patient called in to get X-Ray results and would like a call back    Radiology has not read imaging. Office will contact patient when results are reported out. Please make patient aware.

## 2023-10-31 NOTE — Telephone Encounter (Signed)
 Contacted patient, patient would like a call back once resulted.

## 2023-11-01 ENCOUNTER — Telehealth: Payer: Self-pay | Admitting: Family Medicine

## 2023-11-01 NOTE — Telephone Encounter (Signed)
Still not yet available

## 2023-11-01 NOTE — Telephone Encounter (Signed)
 Copied from CRM [id number]. Topic: Clinical - Lab/Test Results >> Nov 01, 2023 11:48 AM Julia Stevens wrote: Reason for CRM: returning call from office

## 2023-11-01 NOTE — Telephone Encounter (Signed)
 Imaging still not available from the radiologist

## 2023-11-12 ENCOUNTER — Other Ambulatory Visit (HOSPITAL_COMMUNITY): Payer: Self-pay | Admitting: Family Medicine

## 2023-11-12 ENCOUNTER — Other Ambulatory Visit: Payer: Self-pay | Admitting: Family Medicine

## 2023-11-12 DIAGNOSIS — M1612 Unilateral primary osteoarthritis, left hip: Secondary | ICD-10-CM

## 2023-11-20 ENCOUNTER — Encounter: Admitting: Orthopedic Surgery

## 2023-11-21 ENCOUNTER — Telehealth: Payer: Self-pay | Admitting: Family Medicine

## 2023-11-21 NOTE — Telephone Encounter (Signed)
 Copied from CRM [id number]. Topic: Referral - Question >> Nov 21, 2023  2:51 PM [name] wrote: Reason for CRM: Patient called states she is supposed to be getting an appointment for orthopedic - Dr Hilda Lias. She has not made appt yet. Someone told her about a good clinic close to [facility], provider name Loraine Leriche but she does not know name of clinic or last name she wanted to know if Dr. Lodema Hong thinks it would be a good idea to go there for a second opinion.Thank you

## 2023-11-25 NOTE — Telephone Encounter (Signed)
 First attempt. N/A. No vm

## 2023-11-26 NOTE — Telephone Encounter (Signed)
 Pt informed. States she will see Dr. Hilda Lias

## 2023-11-29 ENCOUNTER — Telehealth: Payer: Self-pay

## 2023-11-29 NOTE — Telephone Encounter (Signed)
 I received this message about a hip x-ray however she is not a patient of ours. I think this was sent in error.

## 2023-12-10 ENCOUNTER — Encounter: Payer: Self-pay | Admitting: *Deleted

## 2023-12-12 ENCOUNTER — Other Ambulatory Visit: Payer: Self-pay | Admitting: Family Medicine

## 2023-12-17 ENCOUNTER — Encounter: Payer: Self-pay | Admitting: Family Medicine

## 2023-12-17 ENCOUNTER — Ambulatory Visit (INDEPENDENT_AMBULATORY_CARE_PROVIDER_SITE_OTHER): Payer: Medicare Other | Admitting: Family Medicine

## 2023-12-17 VITALS — BP 144/80 | HR 85 | Resp 16 | Ht 61.0 in | Wt 141.0 lb

## 2023-12-17 DIAGNOSIS — M1612 Unilateral primary osteoarthritis, left hip: Secondary | ICD-10-CM | POA: Diagnosis not present

## 2023-12-17 DIAGNOSIS — L309 Dermatitis, unspecified: Secondary | ICD-10-CM | POA: Diagnosis not present

## 2023-12-17 DIAGNOSIS — I10 Essential (primary) hypertension: Secondary | ICD-10-CM | POA: Diagnosis not present

## 2023-12-17 DIAGNOSIS — M5442 Lumbago with sciatica, left side: Secondary | ICD-10-CM

## 2023-12-17 DIAGNOSIS — E785 Hyperlipidemia, unspecified: Secondary | ICD-10-CM | POA: Diagnosis not present

## 2023-12-17 DIAGNOSIS — G8929 Other chronic pain: Secondary | ICD-10-CM

## 2023-12-17 MED ORDER — AMLODIPINE BESYLATE 2.5 MG PO TABS: 2.5000 mg | ORAL_TABLET | Freq: Every day | ORAL | 2 refills | Status: DC

## 2023-12-17 NOTE — Patient Instructions (Addendum)
 Follow-up in 10 weeks, re eval blood pressure and  general health call if you need me sooner.  Pls schedule  Annual wellness at checkout with nurse  New additional medicine for blood pressure amlodipine 2.5 mg 1 daily, continue medications you are currently taking.  You are referred to Dr. Phyllis Breeze regarding left hip pain, important that you keep the appointment.  Lab today to check potassium level and kidney function  Careful not to fall  Thanks for choosing [facility], we consider it a privelige to serve you.

## 2023-12-17 NOTE — Assessment & Plan Note (Signed)
 Uncontrolled add amlodipine 2.5 mg DASH diet and commitment to daily physical activity for a minimum of 30 minutes discussed and encouraged, as a part of hypertension management. The importance of attaining a healthy weight is also discussed.     12/17/2023    2:23 PM 12/17/2023    2:01 PM 12/17/2023    1:59 PM 10/08/2023    2:59 PM 10/08/2023    2:57 PM 05/07/2023    2:41 PM 03/26/2023   10:00 PM  BP/Weight  Systolic BP 144 148 157 157 155 113 141  Diastolic BP 80 88 78 90 93 67 83  Wt. (Lbs)   141  142.12 --   BMI   26.64 kg/m2  26.85 kg/m2

## 2023-12-17 NOTE — Assessment & Plan Note (Signed)
 Worsened , debilitating pain and instability refer Dr Phyllis Breeze per pt request

## 2023-12-18 LAB — BMP8+EGFR
BUN/Creatinine Ratio: 15 (ref 12–28)
BUN: 13 mg/dL (ref 8–27)
CO2: 24 mmol/L (ref 20–29)
Calcium: 9.5 mg/dL (ref 8.7–10.3)
Chloride: 103 mmol/L (ref 96–106)
Creatinine, Ser: 0.87 mg/dL (ref 0.57–1.00)
Glucose: 79 mg/dL (ref 70–99)
Potassium: 3.4 mmol/L — ABNORMAL LOW (ref 3.5–5.2)
Sodium: 142 mmol/L (ref 134–144)
eGFR: 69 mL/min/{1.73_m2} (ref >59)

## 2023-12-18 MED ORDER — POTASSIUM CHLORIDE CRYS ER 10 MEQ PO TBCR: EXTENDED_RELEASE_TABLET | ORAL | 5 refills | Status: AC

## 2023-12-23 ENCOUNTER — Encounter: Payer: Self-pay | Admitting: Family Medicine

## 2023-12-23 NOTE — Assessment & Plan Note (Signed)
Currently controlled on medication

## 2023-12-23 NOTE — Assessment & Plan Note (Signed)
 No current or recent symptom flare

## 2023-12-23 NOTE — Assessment & Plan Note (Signed)
 Hyperlipidemia:Low fat diet discussed and encouraged.   Lipid Panel  Lab Results  Component Value Date   CHOL 143 10/08/2023   HDL 53 10/08/2023   LDLCALC 75 10/08/2023   TRIG 80 10/08/2023   CHOLHDL 2.7 10/08/2023     Controlled, no change in medication

## 2023-12-23 NOTE — Progress Notes (Signed)
   Julia Stevens     MRN: [id number]      DOB: November 14, 1945  Chief Complaint  Patient presents with   Hypertension    8 week follow up     HPI Julia Stevens is here for follow up and re-evaluation of chronic medical conditions, medication management and review of any available recent lab and radiology data.  Preventive health is updated, specifically  Cancer screening and Immunization.   Questions or concerns regarding consultations or procedures which the PT has had in the interim are  addressed. The PT denies any adverse reactions to current medications since the last visit.  C/o worsened and uncontrolled left hip pain and instability, safe ambulation becoming increasingly difficult ROS Denies recent fever or chills. Denies sinus pressure, nasal congestion, ear pain or sore throat. Denies chest congestion, productive cough or wheezing. Denies chest pains, palpitations and leg swelling Denies abdominal pain, nausea, vomiting,diarrhea or constipation.   Denies dysuria, frequency, hesitancy or incontinence. Denies headaches, seizures, numbness, or tingling. Denies uncontrolled depression, anxiety or insomnia. Denies skin break down or rash.   PE  BP (!) 144/80   Pulse 85   Resp 16   Ht 5\' 1"  (1.549 m)   Wt 141 lb (64 kg)   SpO2 98%   BMI 26.64 kg/m   Patient alert and oriented and in no cardiopulmonary distress.  HEENT: No facial asymmetry, EOMI,     Neck supple .  Chest: Clear to auscultation bilaterally.  CVS: S1, S2 no murmurs, no S3.Regular rate.  ABD: Soft non tender.   Ext: No edema  MS: decreased  ROM spine, shoulders, hips and knees.Marked deformity of small joints  Skin: Intact, no ulcerations or rash noted.  Psych: Good eye contact, normal affect. Memory intact not anxious or depressed appearing.  CNS: CN 2-12 intact, power,  normal throughout.no focal deficits noted.   Assessment & Plan  Osteoarthritis of left hip Worsened , debilitating pain and  instability refer Dr Phyllis Breeze per pt request  HTN, goal below 140/80 Uncontrolled add amlodipine  2.5 mg DASH diet and commitment to daily physical activity for a minimum of 30 minutes discussed and encouraged, as a part of hypertension management. The importance of attaining a healthy weight is also discussed.     12/17/2023    2:23 PM 12/17/2023    2:01 PM 12/17/2023    1:59 PM 10/08/2023    2:59 PM 10/08/2023    2:57 PM 05/07/2023    2:41 PM 03/26/2023   10:00 PM  BP/Weight  Systolic BP 144 148 157 157 155 113 141  Diastolic BP 80 88 78 90 93 67 83  Wt. (Lbs)   141  142.12 --   BMI   26.64 kg/m2  26.85 kg/m2         Hyperlipidemia LDL goal <100 Hyperlipidemia:Low fat diet discussed and encouraged.   Lipid Panel  Lab Results  Component Value Date   CHOL 143 10/08/2023   HDL 53 10/08/2023   LDLCALC 75 10/08/2023   TRIG 80 10/08/2023   CHOLHDL 2.7 10/08/2023     Controlled, no change in medication   Eczema of both hands Currently controlled on medication  Chronic low back pain with left-sided sciatica No current or recent symptom flare

## 2023-12-27 ENCOUNTER — Ambulatory Visit (HOSPITAL_COMMUNITY)
Admission: RE | Admit: 2023-12-27 | Discharge: 2023-12-27 | Disposition: A | Discharge status: Home / Self Care | Payer: Medicare Other | Source: Ambulatory Visit | Attending: Family Medicine | Admitting: Family Medicine

## 2023-12-27 ENCOUNTER — Encounter (HOSPITAL_COMMUNITY): Payer: Self-pay

## 2023-12-27 DIAGNOSIS — Z1231 Encounter for screening mammogram for malignant neoplasm of breast: Secondary | ICD-10-CM | POA: Diagnosis not present

## 2024-01-14 ENCOUNTER — Other Ambulatory Visit: Payer: Self-pay | Admitting: Family Medicine

## 2024-01-29 ENCOUNTER — Other Ambulatory Visit: Payer: Self-pay | Admitting: Family Medicine

## 2024-02-03 ENCOUNTER — Ambulatory Visit (INDEPENDENT_AMBULATORY_CARE_PROVIDER_SITE_OTHER): Admitting: Orthopedic Surgery

## 2024-02-03 ENCOUNTER — Encounter: Payer: Self-pay | Admitting: Orthopedic Surgery

## 2024-02-03 VITALS — BP 143/90 | HR 85 | Ht 61.0 in

## 2024-02-03 DIAGNOSIS — M1612 Unilateral primary osteoarthritis, left hip: Secondary | ICD-10-CM | POA: Diagnosis not present

## 2024-02-03 NOTE — Progress Notes (Signed)
  Intake history:  BP (!) 143/90   Pulse 85   Ht 5' 1 (1.549 m)   BMI 26.64 kg/m  Body mass index is 26.64 kg/m.    WHAT ARE WE SEEING YOU FOR TODAY?   left hip(s) pain goes into left hip and groin   How long has this bothered you? (DOI?DOS?WS?)  5 year(s) ago but now is in Groin also / has been trying to get Dr Iline Mallory to refill her Hydrocodone  byt he has not seen her in past 3 months so he could not refill it.   Anticoag.  No  Diabetes No  Heart disease No  Hypertension Yes  SMOKING HX No  Kidney disease Yes RAS     Latest Ref Rng & Units 12/17/2023    2:45 PM 10/08/2023    3:54 PM 05/07/2023    3:36 PM  CMP  Glucose 70 - 99 mg/dL 79  83  84   BUN 8 - 27 mg/dL 13  10  15    Creatinine 0.57 - 1.00 mg/dL 1.47  8.29  5.62   Sodium 134 - 144 mmol/L 142  142  146   Potassium 3.5 - 5.2 mmol/L 3.4  3.1  3.5   Chloride 96 - 106 mmol/L 103  100  103   CO2 20 - 29 mmol/L 24  27  26    Calcium  8.7 - 10.3 mg/dL 9.5  9.5  9.4   Total Protein 6.0 - 8.5 g/dL  7.9    Total Bilirubin 0.0 - 1.2 mg/dL  0.3    Alkaline Phos 44 - 121 IU/L  95    AST 0 - 40 IU/L  33    ALT 0 - 32 IU/L  16       Any ALLERGIES ______________________________________________   Treatment:  Have you taken:  Tylenol  No  Advil  No  Had PT No  Had injection Yes  Other  _________________________

## 2024-02-03 NOTE — Patient Instructions (Signed)
 Arthritis left hip end-stage  Treatment total hip arthroplasty

## 2024-02-03 NOTE — Progress Notes (Signed)
   Office Visit Note   Patient: Julia Stevens           Date of Birth: 22-Oct-1945           MRN: [id number] Visit Date: 02/03/2024 Requested by: Towanda Fret, MD [address] [place],  [place] [address] PCP: Towanda Fret, MD   Assessment & Plan:   Encounter Diagnosis  Name Primary?   Arthritis of left hip Yes    No orders of the defined types were placed in this encounter.   78 year old female with hypertension end-stage arthritis left hip  She will require hip replacement  She can do it at her leisure, no other treatment available   Subjective: Chief Complaint  Patient presents with   Hip Pain    Left    History 78 year old female with hypertension treated with oral medication followed by Dr. Iline Mallory for bursitis of the left hip questing appointment to see me for groin pain  Reports frequent falls over the last few years  She does take some hydrocodone  for pain   Review of systems chronic leg edema takes fluid pills   BP (!) 143/90   Pulse 85   Ht 5' 1 (1.549 m)   BMI 26.64 kg/m   Exam bilateral chronic edema is noted in both lower extremities  Has multiple deformities of her great toe bunion deformity and crossover toe on the left as her toes bilaterally  She actually has preserved range of motion in terms of flexion of right and left hip  Only has pain with internal rotation of the hip at 90 degrees flexion  No detectable leg length despite x-ray showing proximal migration of the femoral  Previous films show complete loss of joint space in the weightbearing area of the left hip with cyst formation sclerosis and subchondral bone changes including osteophyte formation  Recommend left total hip arthroplasty  Pending medical clearance and patient desire to proceed  Chronic pain can be managed either with Dr. Iline Mallory or chronic pain management

## 2024-02-12 ENCOUNTER — Other Ambulatory Visit: Payer: Self-pay | Admitting: Family Medicine

## 2024-02-19 ENCOUNTER — Ambulatory Visit: Admitting: Orthopaedic Surgery

## 2024-02-25 ENCOUNTER — Ambulatory Visit: Admitting: Family Medicine

## 2024-02-26 ENCOUNTER — Ambulatory Visit
Admission: EM | Admit: 2024-02-26 | Discharge: 2024-02-26 | Disposition: A | Discharge status: Home / Self Care | Attending: Nurse Practitioner | Admitting: Nurse Practitioner

## 2024-02-26 DIAGNOSIS — N3000 Acute cystitis without hematuria: Secondary | ICD-10-CM | POA: Insufficient documentation

## 2024-02-26 DIAGNOSIS — N390 Urinary tract infection, site not specified: Secondary | ICD-10-CM | POA: Diagnosis not present

## 2024-02-26 LAB — POCT URINALYSIS DIP (MANUAL ENTRY)
Bilirubin, UA: NEGATIVE
Glucose, UA: NEGATIVE mg/dL
Ketones, POC UA: NEGATIVE mg/dL
Nitrite, UA: POSITIVE — AB
Protein Ur, POC: 30 mg/dL — AB
Spec Grav, UA: 1.025 (ref 1.010–1.025)
Urobilinogen, UA: 0.2 U/dL
pH, UA: 6.5 (ref 5.0–8.0)

## 2024-02-26 MED ORDER — SULFAMETHOXAZOLE-TRIMETHOPRIM 800-160 MG PO TABS: 1.0000 | ORAL_TABLET | Freq: Two times a day (BID) | ORAL | 0 refills | Status: AC

## 2024-02-26 NOTE — ED Triage Notes (Signed)
 Pt being seen in UC for urinary frequency and urgency. Pt reports having strong urinary odor. Pt reports s/s have been occurring for approximately 2 weeks, unable to make appt with PCP. Pt denies fevers and n/v.

## 2024-02-26 NOTE — ED Provider Notes (Signed)
 [facility]    CSN: [id number] Arrival date & time: 02/26/24  1348      History   Chief Complaint Chief Complaint  Patient presents with   Urinary Frequency    HPI Julia Stevens is a 78 y.o. female.   The history is provided by the patient and a relative.   Patient brought in by family for a 2-week history of urinary frequency, urgency, and malodorous urine.  Patient denies fever, chills, flank pain, dysuria, hematuria, low back pain, vaginal odor, or vaginal discharge.  She does report some intermittent vaginal itching.  Patient reports her last UTI was more than 1 year ago.  Denies prior history of kidney stones or kidney infection.  Past Medical History:  Diagnosis Date   Arthritis    Dysphagia    Eczema    Hyperlipidemia    Hypertension    Psychosis (HCC)    Scleroderma (HCC)    Shingles     Patient Active Problem List   Diagnosis Date Noted   Primary hypertension 10/20/2023   Osteoarthritis of left hip 10/20/2023   Cough 10/20/2023   Vitamin D  deficiency 10/20/2023   Chronic low back pain with left-sided sciatica 05/07/2023   PVD (peripheral vascular disease) (HCC) 05/13/2022   Trochanteric bursitis, left hip 09/15/2021   Rash and nonspecific skin eruption 01/06/2021   RAS (renal artery stenosis) (HCC) 07/21/2020   Double vision 04/26/2020   Chronic hip pain, left 08/26/2019   Polyarthritis 07/05/2015   Pruritus 07/05/2015   Hyperlipidemia LDL goal <100 09/01/2014   Neck pain on left side 07/23/2011   Allergic rhinitis 04/29/2009   Rheumatoid arthritis of hand (HCC) 06/03/2008   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      Home Medications    Prior to Admission medications   Medication Sig Start Date End Date Taking? Authorizing Provider  acetaminophen  (TYLENOL ) 500 MG tablet Take one tablet by mouth once to twice daily for arthritis Patient not taking: Reported on 02/26/2024 09/15/21   Antonetta Rollene BRAVO, MD  amLODipine  (NORVASC ) 2.5 MG tablet Take 1 tablet (2.5 mg total) by mouth daily. 02/12/24   Antonetta Rollene BRAVO, MD  calcium  carbonate (OS-CAL) 600 MG TABS tablet Take 600 mg by mouth 2 (two) times daily with a meal. Patient not taking: Reported on 02/26/2024    [provider]  diltiazem  (CARDIZEM  CD) 300 MG 24 hr capsule Take 1 capsule (300 mg total) by mouth daily. 12/12/23   Antonetta Rollene BRAVO, MD  hydrochlorothiazide  (HYDRODIURIL ) 25 MG tablet Take 1 tablet (25 mg total) by mouth daily. 12/12/23   Antonetta Rollene BRAVO, MD  ibuprofen  (ADVIL ) 200 MG tablet Take 600 mg by mouth every 6 (six) hours as needed for headache.    [provider]  Multiple Vitamin (MULTIVITAMIN) capsule Take 1 capsule by mouth daily.    [provider]  nortriptyline  (PAMELOR ) 50  MG capsule Take 1 capsule (50 mg total) by mouth at bedtime. 01/14/24   Antonetta Rollene BRAVO, MD  potassium chloride  (KLOR-CON  M) 10 MEQ tablet Take two tablets in the morning and take one tablet in the evening 12/18/23   Antonetta Rollene BRAVO, MD  potassium chloride  (KLOR-CON ) 10 MEQ tablet Take 10 mEq by mouth 2 (two) times daily. 01/23/24   [provider]  rosuvastatin  (CRESTOR ) 10 MG tablet Take 1 tablet (10 mg total) by mouth daily. 01/29/24   Antonetta Rollene BRAVO, MD  triamcinolone cream (KENALOG) 0.1 % APPLY A SMALL AMOUNT TO THE AFFECTED AREA TWICE ADAY AS NEEDED. 06/19/22    Antonetta Rollene BRAVO, MD  UNABLE TO FIND Med Name: Rolling walker 08/08/23   Antonetta Rollene BRAVO, MD    Family History Family History  Problem Relation Age of Onset   Hypertension Mother    Heart disease Mother        CAD   Hypertension Father         Severe DJD/ macrobacterium   Hypertension Sister    Hypertension Sister    Fibromyalgia Sister    Diabetes Brother    Heart disease Brother    Colon cancer Neg Hx     Social History Social History   Tobacco Use   Smoking status: Never   Smokeless tobacco: Never  Vaping Use   Vaping status: Never Used  Substance Use Topics   Alcohol use: No   Drug use: No     Allergies   Ace inhibitors, Ciprofloxacin, Hydrocodone , and Penicillins   Review of Systems Review of Systems Per HPI  Physical Exam Triage Vital Signs ED Triage Vitals [02/26/24 1445]  Encounter Vitals Group     BP 129/73     Girls Systolic BP Percentile      Girls Diastolic BP Percentile      Boys Systolic BP Percentile      Boys Diastolic BP Percentile      Pulse Rate 76     Resp 20     Temp 98.3 F (36.8 C)     Temp Source Oral     SpO2 97 %     Weight      Height      Head Circumference      Peak Flow      Pain Score 9     Pain Loc      Pain Education      Exclude from Growth Chart    No data found.  Updated Vital Signs BP 129/73 (BP Location: Right Arm)   Pulse 76   Temp 98.3 F (36.8 C) (Oral)   Resp 20   SpO2 97%   Visual Acuity Right Eye Distance:   Left Eye Distance:   Bilateral Distance:    Right Eye Near:   Left Eye Near:    Bilateral Near:     Physical Exam Vitals and nursing note reviewed.  Constitutional:      General: She is not in acute distress.    Appearance: Normal appearance.  HENT:     Head: Normocephalic.  Eyes:     Extraocular Movements: Extraocular movements intact.     Conjunctiva/sclera: Conjunctivae normal.     Pupils: Pupils are equal, round, and reactive to light.  Cardiovascular:     Rate  and Rhythm: Regular rhythm.     Pulses: Normal pulses.     Heart sounds: Normal heart sounds.  Pulmonary:     Effort: Pulmonary  effort is normal. No respiratory distress.     Breath sounds: Normal breath sounds. No stridor. No wheezing, rhonchi or rales.  Abdominal:     General: Bowel sounds are normal.     Palpations: Abdomen is soft.     Tenderness: There is no abdominal tenderness. There is no right CVA tenderness or left CVA tenderness.  Musculoskeletal:     Cervical back: Normal range of motion.  Skin:    General: Skin is warm and dry.  Neurological:     General: No focal deficit present.     Mental Status: She is alert and oriented to person, place, and time.  Psychiatric:        Mood and Affect: Mood normal.        Behavior: Behavior normal.      UC Treatments / Results  Labs (all labs ordered are listed, but only abnormal results are displayed) Labs Reviewed  POCT URINALYSIS DIP (MANUAL ENTRY) - Abnormal; Notable for the following components:      Result Value   Clarity, UA cloudy (*)    Blood, UA small (*)    Protein Ur, POC =30 (*)    Nitrite, UA Positive (*)    Leukocytes, UA Small (1+) (*)    All other components within normal limits  URINE CULTURE    EKG   Radiology No results found.  Procedures Procedures (including critical care time)  Medications Ordered in UC Medications - No data to display  Initial Impression / Assessment and Plan / UC Course  I have reviewed the triage vital signs and the nursing notes.  Pertinent labs & imaging results that were available during my care of the patient were reviewed by me and considered in my medical decision making (see chart for details).  On exam, lung sounds are clear throughout, room air sats are at 97%.  Patient does not have CVA tenderness or suprapubic pressure noted on exam.  Urinalysis is positive for nitrites and leukocytes along with blood and protein, consistent with acute cystitis.  Urine  culture is pending.  In the interim, will treat with Bactrim  DS 800/160 mg twice daily for the next 7 days.  Reviewed most recent GFR, no need for renal dosing at this time.  Supportive care recommendations were provided and discussed with patient and family to include over-the-counter analgesics, fluids, developing toileting schedule, and avoiding caffeine.  Discussed indications with patient's family regarding follow-up.  Patient and family were in agreement with this plan of care and verbalized understanding.  All questions were answered.  Patient stable for discharge.   Final Clinical Impressions(s) / UC Diagnoses   Final diagnoses:  Acute cystitis without hematuria     Discharge Instructions      Your urinalysis shows you have a Neri tract infection.  A urine culture has been ordered.  You will be contacted if the pending test result shows that the medication prescribed today needs to be changed. Take medication as prescribed. You may take over-the-counter Tylenol  as needed for pain, fever, or general discomfort. Make sure you are drinking plenty of fluids.  Try to drink at least 8-10 8 ounce glasses of water  daily while symptoms persist. Avoid caffeine such as tea, soda, or coffee while symptoms persist. Develop a toileting schedule that will allow you to urinate at least every 2 hours. Go to the emergency department immediately if you experience fever, chills, or worsening urinary symptoms.  Do recommend follow-up with your PCP within the next  7 to 10 days for reevaluation. Follow-up as needed.     ED Prescriptions   None    PDMP not reviewed this encounter.   Gilmer Etta PARAS, NP 02/26/24 1528

## 2024-02-26 NOTE — Discharge Instructions (Addendum)
 Your urinalysis shows you have a urinary tract infection.  A urine culture has been ordered.  You will be contacted if the pending test result shows that the medication prescribed today needs to be changed. Take medication as prescribed. You may take over-the-counter Tylenol  as needed for pain, fever, or general discomfort. Make sure you are drinking plenty of fluids.  Try to drink at least 8-10 8 ounce glasses of water  daily while symptoms persist. Avoid caffeine such as tea, soda, or coffee while symptoms persist. Develop a toileting schedule that will allow you to urinate at least every 2 hours. Go to the emergency department immediately if you experience fever, chills, or worsening urinary symptoms.  Do recommend follow-up with your PCP within the next 7 to 10 days for reevaluation. Follow-up as needed.

## 2024-02-28 ENCOUNTER — Ambulatory Visit (HOSPITAL_COMMUNITY): Payer: Self-pay

## 2024-02-28 LAB — URINE CULTURE: Culture: >=100000 — AB

## 2024-03-15 ENCOUNTER — Other Ambulatory Visit: Payer: Self-pay | Admitting: Family Medicine

## 2024-03-23 ENCOUNTER — Other Ambulatory Visit: Payer: Self-pay | Admitting: Family Medicine

## 2024-05-13 ENCOUNTER — Other Ambulatory Visit: Payer: Self-pay | Admitting: Family Medicine

## 2024-06-06 ENCOUNTER — Emergency Department (HOSPITAL_COMMUNITY)
Admission: EM | Admit: 2024-06-06 | Discharge: 2024-06-06 | Disposition: A | Discharge status: Home / Self Care | Attending: Emergency Medicine | Admitting: Emergency Medicine

## 2024-06-06 ENCOUNTER — Encounter (HOSPITAL_COMMUNITY): Payer: Self-pay

## 2024-06-06 ENCOUNTER — Other Ambulatory Visit: Payer: Self-pay

## 2024-06-06 ENCOUNTER — Emergency Department (HOSPITAL_COMMUNITY)

## 2024-06-06 DIAGNOSIS — M79605 Pain in left leg: Secondary | ICD-10-CM

## 2024-06-06 DIAGNOSIS — Z79899 Other long term (current) drug therapy: Secondary | ICD-10-CM | POA: Diagnosis not present

## 2024-06-06 DIAGNOSIS — R7989 Other specified abnormal findings of blood chemistry: Secondary | ICD-10-CM

## 2024-06-06 DIAGNOSIS — M1712 Unilateral primary osteoarthritis, left knee: Secondary | ICD-10-CM | POA: Diagnosis not present

## 2024-06-06 DIAGNOSIS — M1990 Unspecified osteoarthritis, unspecified site: Secondary | ICD-10-CM | POA: Diagnosis not present

## 2024-06-06 DIAGNOSIS — W19XXXA Unspecified fall, initial encounter: Secondary | ICD-10-CM | POA: Diagnosis not present

## 2024-06-06 DIAGNOSIS — I10 Essential (primary) hypertension: Secondary | ICD-10-CM | POA: Insufficient documentation

## 2024-06-06 DIAGNOSIS — R600 Localized edema: Secondary | ICD-10-CM | POA: Insufficient documentation

## 2024-06-06 DIAGNOSIS — M25562 Pain in left knee: Secondary | ICD-10-CM | POA: Diagnosis not present

## 2024-06-06 DIAGNOSIS — R079 Chest pain, unspecified: Secondary | ICD-10-CM | POA: Diagnosis not present

## 2024-06-06 DIAGNOSIS — M25521 Pain in right elbow: Secondary | ICD-10-CM | POA: Diagnosis not present

## 2024-06-06 DIAGNOSIS — R39198 Other difficulties with micturition: Secondary | ICD-10-CM | POA: Insufficient documentation

## 2024-06-06 DIAGNOSIS — M129 Arthropathy, unspecified: Secondary | ICD-10-CM | POA: Diagnosis not present

## 2024-06-06 DIAGNOSIS — M7989 Other specified soft tissue disorders: Secondary | ICD-10-CM | POA: Diagnosis present

## 2024-06-06 DIAGNOSIS — M25552 Pain in left hip: Secondary | ICD-10-CM | POA: Diagnosis not present

## 2024-06-06 LAB — CBC WITH DIFFERENTIAL/PLATELET
Abs Immature Granulocytes: 0 K/uL (ref 0.00–0.07)
Basophils Absolute: 0 K/uL (ref 0.0–0.1)
Basophils Relative: 0 %
Eosinophils Absolute: 0 K/uL (ref 0.0–0.5)
Eosinophils Relative: 1 %
HCT: 35.9 % — ABNORMAL LOW (ref 36.0–46.0)
Hemoglobin: 11.7 g/dL — ABNORMAL LOW (ref 12.0–15.0)
Immature Granulocytes: 0 %
Lymphocytes Relative: 29 %
Lymphs Abs: 1.2 K/uL (ref 0.7–4.0)
MCH: 29.3 pg (ref 26.0–34.0)
MCHC: 32.6 g/dL (ref 30.0–36.0)
MCV: 89.8 fL (ref 80.0–100.0)
Monocytes Absolute: 0.5 K/uL (ref 0.1–1.0)
Monocytes Relative: 12 %
Neutro Abs: 2.5 K/uL (ref 1.7–7.7)
Neutrophils Relative %: 58 %
Platelets: 237 K/uL (ref 150–400)
RBC: 4 MIL/uL (ref 3.87–5.11)
RDW: 13.2 % (ref 11.5–15.5)
WBC: 4.2 K/uL (ref 4.0–10.5)
nRBC: 0 % (ref 0.0–0.2)

## 2024-06-06 LAB — COMPREHENSIVE METABOLIC PANEL WITH GFR
ALT: 10 U/L (ref 0–44)
AST: 26 U/L (ref 15–41)
Albumin: 4.2 g/dL (ref 3.5–5.0)
Alkaline Phosphatase: 77 U/L (ref 38–126)
Anion gap: 10 (ref 5–15)
BUN: 20 mg/dL (ref 8–23)
CO2: 29 mmol/L (ref 22–32)
Calcium: 9.5 mg/dL (ref 8.9–10.3)
Chloride: 100 mmol/L (ref 98–111)
Creatinine, Ser: 0.93 mg/dL (ref 0.44–1.00)
GFR, Estimated: >60 mL/min (ref >60)
Glucose, Bld: 88 mg/dL (ref 70–99)
Potassium: 3.7 mmol/L (ref 3.5–5.1)
Sodium: 139 mmol/L (ref 135–145)
Total Bilirubin: 0.4 mg/dL (ref 0.0–1.2)
Total Protein: 8.8 g/dL — ABNORMAL HIGH (ref 6.5–8.1)

## 2024-06-06 LAB — URINALYSIS, ROUTINE W REFLEX MICROSCOPIC
Bilirubin Urine: NEGATIVE
Glucose, UA: NEGATIVE mg/dL
Hgb urine dipstick: NEGATIVE
Ketones, ur: NEGATIVE mg/dL
Leukocytes,Ua: NEGATIVE
Nitrite: NEGATIVE
Protein, ur: NEGATIVE mg/dL
Specific Gravity, Urine: 1.01 (ref 1.005–1.030)
pH: 7 (ref 5.0–8.0)

## 2024-06-06 LAB — PRO BRAIN NATRIURETIC PEPTIDE: Pro Brain Natriuretic Peptide: <50 pg/mL (ref <300.0)

## 2024-06-06 LAB — D-DIMER, QUANTITATIVE: D-Dimer, Quant: 0.83 ug{FEU}/mL — ABNORMAL HIGH (ref 0.00–0.50)

## 2024-06-06 NOTE — ED Provider Notes (Signed)
 [place] EMERGENCY DEPARTMENT AT [facility] Provider Note   CSN: [id number] Arrival date & time: 06/06/24  1343     Patient presents with: Leg Swelling and Leg Pain   Julia Stevens is a 78 y.o. female presenting to the ED with a chief complaint of left hip, left knee, and right elbow pain after a fall last week. Patient also presents with a complaint regarding her lower extremity edema notably in her left leg.  Patient denies hitting head and denies LOC. The patient describes the patient describes her hip, knee, elbow pain as sore and is wax/wane/positional in nature.  Patient states that she did not seek medical care after the fall.  Patient states that she has a history of lower extremity edema and takes HCTZ for same however her left leg has felt more swollen and painful than normal.  Patient denies numbness to the extremity.  Patient also notes that she has had difficulty urinating for the past two weeks and urine has been brown in color.  Patient denies any fevers.  Patient denies any nausea, vomiting, fever, shortness of breath, chest pain. The patient reports she has been taking Advil  600 mg for the pain however she is also concerned this could be contributing to her difficulty urinating. No recent travel. No sick contacts. The patient's social history is notable for no tobacco, alcohol, or drug use.     Leg Pain Associated symptoms: no back pain, no fatigue and no fever        Prior to Admission medications   Medication Sig Start Date End Date Taking? Authorizing Provider  acetaminophen  (TYLENOL ) 500 MG tablet Take one tablet by mouth once to twice daily for arthritis Patient not taking: Reported on 02/26/2024 09/15/21   Antonetta Rollene BRAVO, MD  amLODipine  (NORVASC ) 2.5 MG tablet Take 1 tablet (2.5 mg total) by mouth daily. 05/13/24   Antonetta Rollene BRAVO, MD  calcium  carbonate (OS-CAL) 600 MG TABS tablet Take 600 mg by mouth 2 (two) times daily with a meal. Patient not  taking: Reported on 02/26/2024    [provider]  diltiazem  (CARDIZEM  CD) 300 MG 24 hr capsule Take 1 capsule (300 mg total) by mouth daily. 03/23/24   Antonetta Rollene BRAVO, MD  hydrochlorothiazide  (HYDRODIURIL ) 25 MG tablet Take 1 tablet (25 mg total) by mouth daily. 03/23/24   Antonetta Rollene BRAVO, MD  ibuprofen  (ADVIL ) 200 MG tablet Take 600 mg by mouth every 6 (six) hours as needed for headache.    [provider]  Multiple Vitamin (MULTIVITAMIN) capsule Take 1 capsule by mouth daily.    [provider]  nortriptyline  (PAMELOR ) 50 MG capsule Take 1 capsule (50 mg total) by mouth at bedtime. 05/13/24   Antonetta Rollene BRAVO, MD  potassium chloride  (KLOR-CON  M) 10 MEQ tablet Take two tablets in the morning and take one tablet in the evening 12/18/23   Antonetta Rollene BRAVO, MD  potassium chloride  (KLOR-CON ) 10 MEQ tablet Take 10 mEq by mouth 2 (two) times daily. 01/23/24   [provider]  rosuvastatin  (CRESTOR ) 10 MG tablet Take 1 tablet (10 mg total) by mouth daily. 01/29/24   Antonetta Rollene BRAVO, MD  triamcinolone cream (KENALOG) 0.1 % APPLY A SMALL AMOUNT TO THE AFFECTED AREA TWICE ADAY AS NEEDED. 06/19/22   Antonetta Rollene BRAVO, MD  UNABLE TO FIND Med Name: Rolling walker 08/08/23   Antonetta Rollene BRAVO, MD    Allergies: Ace inhibitors, Ciprofloxacin, Hydrocodone , and Penicillins    Review  of Systems  Constitutional:  Positive for activity change (States she has been having difficulty ambulating.). Negative for fatigue and fever.  Respiratory:  Negative for cough, chest tightness and shortness of breath.   Cardiovascular:  Positive for leg swelling (States that left leg is more swollen than normal.). Negative for chest pain.  Gastrointestinal:  Negative for abdominal pain, diarrhea, nausea and vomiting.  Genitourinary:  Positive for decreased urine volume, difficulty urinating and hematuria (Patient states urine is brown in color.). Negative for dysuria, flank pain and  frequency.  Musculoskeletal:  Negative for back pain.  Skin:  Positive for color change (Lower left extremity.).  Neurological:  Negative for weakness and numbness.    Updated Vital Signs BP (!) 150/90   Pulse 72   Temp (!) 97.4 F (36.3 C)   Resp 17   Ht 5' 1 (1.549 m)   Wt 64 kg   SpO2 100%   BMI 26.64 kg/m   Physical Exam Vitals and nursing note reviewed.  Constitutional:      General: She is not in acute distress.    Appearance: Normal appearance.  HENT:     Head: Normocephalic and atraumatic.  Eyes:     Extraocular Movements: Extraocular movements intact.     Conjunctiva/sclera: Conjunctivae normal.     Pupils: Pupils are equal, round, and reactive to light.  Cardiovascular:     Rate and Rhythm: Normal rate and regular rhythm.     Pulses: Normal pulses.          Radial pulses are 2+ on the right side.        Left popliteal pulse not accessible.       Dorsalis pedis pulses are 2+ on the right side and 2+ on the left side.     Comments: Unable to palpate popliteal pulse on the left lower extremity due to body habitus.  Venous stasis noted bilateral lower extremities. Pulmonary:     Effort: Pulmonary effort is normal. No respiratory distress.     Breath sounds: Normal breath sounds.  Chest:     Chest wall: No tenderness or crepitus.  Abdominal:     General: Abdomen is flat.     Palpations: Abdomen is soft.     Tenderness: There is no abdominal tenderness.  Musculoskeletal:        General: Normal range of motion.     Right shoulder: Normal.     Left shoulder: Normal.     Right upper arm: Normal.     Right elbow: No swelling or deformity. Tenderness present in olecranon process.     Left elbow: Normal.     Right forearm: Normal.     Left forearm: Normal.     Right wrist: Normal.     Left wrist: Normal.     Cervical back: Normal range of motion.     Right hip: Normal.     Left hip: Bony tenderness present. No crepitus.     Right knee: Normal.     Left knee:  Bony tenderness present. No crepitus.     Right lower leg: 2+ Pitting Edema present.     Left lower leg: Swelling and tenderness present. No deformity. 2+ Pitting Edema present.     Comments: Patient has notable edema and venous stasis bilateral lower extremities.  Patient's stasis dermatitis is notably darker in the left lower extremity.  Patient states bony tenderness to left hip left knee, left lower extremity, right elbow.  Feet:  Right foot:     Skin integrity: Skin integrity normal.     Left foot:     Skin integrity: Skin integrity normal.  Skin:    General: Skin is warm and dry.     Capillary Refill: Capillary refill takes less than 2 seconds.  Neurological:     General: No focal deficit present.     Mental Status: She is alert. Mental status is at baseline.     GCS: GCS eye subscore is 4. GCS verbal subscore is 5. GCS motor subscore is 6.  Psychiatric:        Mood and Affect: Mood normal.     (all labs ordered are listed, but only abnormal results are displayed) Labs Reviewed  COMPREHENSIVE METABOLIC PANEL WITH GFR - Abnormal; Notable for the following components:      Result Value   Total Protein 8.8 (*)    All other components within normal limits  CBC WITH DIFFERENTIAL/PLATELET - Abnormal; Notable for the following components:   Hemoglobin 11.7 (*)    HCT 35.9 (*)    All other components within normal limits  URINALYSIS, ROUTINE W REFLEX MICROSCOPIC - Abnormal; Notable for the following components:   Color, Urine STRAW (*)    All other components within normal limits  D-DIMER, QUANTITATIVE - Abnormal; Notable for the following components:   D-Dimer, Quant 0.83 (*)    All other components within normal limits  PRO BRAIN NATRIURETIC PEPTIDE    EKG: EKG Interpretation Date/Time:  Saturday June 06 2024 15:07:46 EDT Ventricular Rate:  70 PR Interval:  219 QRS Duration:  80 QT Interval:  415 QTC Calculation: 448 R Axis:   -8  Text Interpretation: Sinus  rhythm Borderline prolonged PR interval Low voltage, precordial leads Confirmed by Cleotilde Rogue ([id number]) on 06/06/2024 4:39:48 PM  Radiology: [facility] Knee Complete 4 Views Left Result Date: 06/06/2024 CLINICAL DATA:  Pain after fall. EXAM: LEFT KNEE - COMPLETE 4+ VIEW COMPARISON:  None Available. FINDINGS: No acute fracture or dislocation. Tricompartmental peripheral spurring with preservation of joint spaces. No significant joint effusion. No focal bone abnormality or erosive change. No focal soft tissue abnormalities. IMPRESSION: 1. No acute fracture or dislocation. 2. Mild tricompartmental osteoarthritis. Electronically Signed   By: Andrea Gasman M.D.   On: 06/06/2024 16:55   DG Chest 2 View Result Date: 06/06/2024 CLINICAL DATA:  Pain after fall. EXAM: CHEST - 2 VIEW COMPARISON:  10/25/2023 FINDINGS: The cardiomediastinal contours are normal. The lungs are clear. Pulmonary vasculature is normal. No consolidation, pleural effusion, or pneumothorax. Mild scoliotic curvature of the thoracic spine. No acute osseous abnormalities are seen. IMPRESSION: No acute findings. Electronically Signed   By: Andrea Gasman M.D.   On: 06/06/2024 16:54   DG Elbow Complete Right Result Date: 06/06/2024 CLINICAL DATA:  Pain after fall. EXAM: RIGHT ELBOW - COMPLETE 3+ VIEW COMPARISON:  None Available. FINDINGS: There is no evidence of fracture, dislocation, or joint effusion. There is no evidence of arthropathy or other focal bone abnormality. Mild soft tissue edema. IMPRESSION: Mild soft tissue edema. No fracture or dislocation. Electronically Signed   By: Andrea Gasman M.D.   On: 06/06/2024 16:53   DG Hip Unilat W or Wo Pelvis 2-3 Views Left Result Date: 06/06/2024 CLINICAL DATA:  Pain after fall. EXAM: DG HIP (WITH OR WITHOUT PELVIS) 2-3V LEFT COMPARISON:  Hip radiograph 10/25/2023 FINDINGS: No evidence of acute fracture or dislocation. Advanced chronic left hip arthropathy with near complete joint space loss,  subchondral cystic  changes and sclerosis. No convincing avascular necrosis or articular collapse. Pubic rami are intact. The pubic symphysis and sacroiliac joints are congruent. Large volume of stool in the included colon. IMPRESSION: 1. No acute fracture or dislocation of the left hip. 2. Advanced chronic left hip arthropathy. Electronically Signed   By: Andrea Gasman M.D.   On: 06/06/2024 16:50     Procedures   Medications Ordered in the ED - No data to display                                  Medical Decision Making Amount and/or Complexity of Data Reviewed Labs: ordered. Radiology: ordered.   Patient presents to the ED for concern of left hip, left knee, and right elbow pain after a fall, lower extremity edema, and urinary issues.This involves an extensive number of treatment options, and is a complaint that carries with it a high risk of complications and morbidity.    The differential diagnosis includes: Fracture DVT Heart Failure UTI  Co morbidities that complicate the patient evaluation: Obesity Arthritis Hypertension Psychosis  Additional history obtained:  Additional history obtained from Family and Outside Medical Records  External records from outside source obtained and reviewed including medical history, surgical history, problem list, allergies, medications. The patient and daughter at bedside was a reliable historian providing a clear, detailed, and consistent account of the presenting symptoms and relevant medical history. The information was obtained directly from the patient and her daughter and statements were documented in the patient's own words when possible. No discrepancies were noted between the history provided and available collateral sources.     Lab Tests: I ordered, and personally interpreted labs.   The pertinent results include:  Total protein - 8.8   Hemoglobin -11.7 HCT - 35.9 Urinalysis - unremarkable  D-dimer - 0.83 BMP -  WNL  Imaging Studies ordered: I ordered imaging studies including: DG Knee complete series  DG chest 2 view DG elbow complete right DG hip unilateral left with pelvis series I independently visualized and interpreted imaging which showed: No acute fracture or dislocation Chronic arthritic changes I agree with the radiologist interpretation  Cardiac Monitoring: The patient was maintained on a cardiac monitor.  I personally viewed and interpreted the cardiac monitored which showed an underlying rhythm of: Sinus rhythm  Medicines ordered and prescription drug management: I ordered medications: No medication or prescription drug management at this visit. I have reviewed the patients home medicines and have made adjustments as needed  Test Considered: Diagnostic testing was considered based on the patient's presenting symptoms, risk factors, and initial clinical assessment.  Initial evaluation included workup for possible DVT.  Given criteria high risk, further noninvasive testing such as lower extremity ultrasound was considered. Shared decision making was employed to discuss the benefits and risks of diagnostic tests. Patient values and preferences were incorporated into the decision process.   The approach to diagnostic testing prioritized exclusion of life-threatening conditions  Diagnostic tools:  Clinical decision tools such as MDCalc were used in the emergency department to support diagnostic accuracy, risk stratification, and disposition planning.  Wells' Criteria for DVT from [email or web address] on 06/06/2024 RESULT SUMMARY: 4 points High risk group for DVT. "Likely" according to [facility]' DVT studies. INPUTS: Active cancer --> 0 = No Bedridden recently >3 days or major surgery within 12 weeks --> 0 = No Calf swelling >3 cm compared to the other leg --> 0 =  No Collateral (nonvaricose) superficial veins present --> 1 = Yes Entire leg swollen --> 1 = Yes Localized tenderness along the  deep venous system --> 1 = Yes Pitting edema, confined to symptomatic leg --> 1 = Yes Paralysis, paresis, or recent plaster immobilization of the lower extremity --> 0 = No Previously documented DVT --> 0 = No Alternative diagnosis to DVT as likely or more likely --> 0 = No  Problem List / ED Course: Problem List: Acute musculoskeletal pain due to recent fall Lower extremity edema Pain in the lower left extremity Difficulty urinating Emergency Department Course: The patient presented with full complaints including musculoskeletal pain after a fall last week, lower extremity edema, pain in the lower left extremity, and difficulty urinating. Patient denies hitting head and denies LOC. The patient describes the patient describes her hip, knee, elbow pain as sore and is wax/wane/positional in nature.  Patient states that she did not seek medical care after the fall.  Patient states that she has a history of lower extremity edema and takes HCTZ for same however her left leg has felt more swollen and painful than normal.  Patient denies numbness to the extremity.  Patient also notes that she has had difficulty urinating for the past two weeks and urine has been brown in color.  Patient denies any fevers.  Patient denies any nausea, vomiting, fever, shortness of breath, chest pain. Initial assessment included history, physical exam, and review of prior medical records. ECG was ordered and reviewed; negative for acute ischemia.  Imaging was performed to rule out attic injury from patient's fall last week; no acute findings.  Diagnostic labs were completed, D-dimer was found to be slightly elevated at 0.83.  Given the patient's risk factors, and high risk Wells score, the decision was made to proceed with an ultrasound of the lower left extremity at an appointment tomorrow morning. Differential diagnosis considered included fracture, UTI, heart failure.  Given all negative imaging makes fracture unlikely.   Given negative urinalysis makes UTI unlikely.  And physical exam, patient history, and negative BNP makes heart failure unlikely.  Given patient history of no chest pain or shortness of breath and physical exam makes PE unlikely however given elevated D-dimer and Wells score cannot be completely ruled out at this time.  Reevaluation: After the interventions noted above, I reevaluated the patient and found that they have :stayed the same  Social Determinants of Health: Patient screened for adverse SDOH domains per ED protocol. Screening included assessment of housing instability, food insecurity, transportation difficulties, and trouble paying for utilities. No adverse SDOH identified requiring intervention at this time. Social work consult not indicated.   Dispostion: After consideration of the diagnostic results and the patients response to treatment, I feel that the patent would benefit from discharge and follow-up with ultrasound tomorrow morning and with orthopedics for arthritis. Patient risk for adverse outcomes and disposition was assessed using a simplified approach integrating triage category, age, vital signs, comorbidities, and key diagnostic findings. No acute deterioration noted during ED stay. Disposition Plan: The patient is medically stable for discharge from the Emergency Department at this time. Vital signs are within normal limits, and the patient is alert, oriented, and in no acute distress. Diagnostic evaluation and therapeutic interventions have been completed with no findings necessitating hospital admission or further emergent workup.  Patient and family present at bedside were gated on the return process for ultrasound in the morning and the importance of the ultrasound and evaluating for possible  DVT.  The treatment plan, diagnostic results, and clinical impression were discussed with the patient and family. The patient demonstrated understanding of the discharge diagnosis,  medication regimen, activity restrictions, and follow-up plan. Strict return precautions were reviewed, including instructions to return to the Emergency Department or contact emergency medical services ([id number]) for any worsening or new symptoms such as chest pain, shortness of breath, syncope, uncontrolled pain, persistent vomiting, fever, bleeding, or neurologic changes. The patient was encouraged to follow up with their orthopedic provider.  The patient was discharged home in stable condition, ambulatory with steady gait, and with all personal belongings in their possession. Care Coordination: Follow-up appointment scheduled with ultrasound tomorrow morning at 9 AM. Communication:   Patient and family informed of disposition decision and rationale. Questions addressed.       Final diagnoses:  Bilateral lower extremity edema  Left leg pain  Arthritis    ED Discharge Orders          Ordered    US  Venous Img Lower Unilateral Left        06/06/24 1714               Willma Duwaine CROME, [place] 06/06/24 1811    Cleotilde Rogue, MD 06/07/24 1810

## 2024-06-06 NOTE — ED Notes (Signed)
 Pt/family received d/c paperwork at this time. After going over the paperwork any questions, comments, or concerns were answered to the best of this nurse's knowledge. The pt/family verbally acknowledged the teachings/instructions.

## 2024-06-06 NOTE — ED Triage Notes (Signed)
 Pt from home complains of leg swelling and pain also endorse pain in left elbow. Pt legs appear swollen and edematous Legs also feel heavy and weak.  Denies CP and SOB. Denies falls or hitting legs.

## 2024-06-06 NOTE — Discharge Instructions (Signed)
 Thank you for visiting the Emergency Department today. It was a pleasure to be part of your healthcare team.  As we discussed, your lab work today showed an increased possibility for a blood clot.  You are scheduled for a ultrasound tomorrow.  Please also follow-up with Dr. Margrette within 2 weeks for your arthritis pain. If you have any questions about your medicines, please call your pharmacy or healthcare provider.  It is important to watch for warning signs such as worsening pain, fever, trouble breathing. If any of these happen, return to the Emergency Department or call 911. Thank you for trusting us  with your health.

## 2024-06-07 ENCOUNTER — Ambulatory Visit (HOSPITAL_COMMUNITY)
Admission: RE | Admit: 2024-06-07 | Discharge: 2024-06-07 | Disposition: A | Discharge status: Home / Self Care | Source: Ambulatory Visit

## 2024-06-07 DIAGNOSIS — M25572 Pain in left ankle and joints of left foot: Secondary | ICD-10-CM | POA: Diagnosis not present

## 2024-06-07 DIAGNOSIS — R7989 Other specified abnormal findings of blood chemistry: Secondary | ICD-10-CM | POA: Diagnosis not present

## 2024-06-07 DIAGNOSIS — R600 Localized edema: Secondary | ICD-10-CM | POA: Diagnosis not present

## 2024-06-07 NOTE — ED Provider Notes (Signed)
    Patient returned this morning for recently scheduled ultrasound of the left lower extremity.  Ultrasound was negative for presence of DVT.  Discussed ultrasound findings with patient and she agrees to close outpatient follow-up with her PCP and states that she has follow-up with local orthopedics as well.     US  Venous Img Lower Unilateral Left (DVT) Result Date: 06/07/2024 CLINICAL DATA:  Left ankle pain x1 year. EXAM: LEFT LOWER EXTREMITY VENOUS DOPPLER ULTRASOUND TECHNIQUE: Gray-scale sonography with graded compression, as well as color Doppler and duplex ultrasound were performed to evaluate the lower extremity deep venous systems from the level of the common femoral vein and including the common femoral, femoral, profunda femoral, popliteal and calf veins including the posterior tibial, peroneal and gastrocnemius veins when visible. The superficial great saphenous vein was also interrogated. Spectral Doppler was utilized to evaluate flow at rest and with distal augmentation maneuvers in the common femoral, femoral and popliteal veins. COMPARISON:  None Available. FINDINGS: Contralateral Common Femoral Vein: Respiratory phasicity is normal and symmetric with the symptomatic side. No evidence of thrombus. Normal compressibility. Common Femoral Vein: No evidence of thrombus. Normal compressibility, respiratory phasicity and response to augmentation. Saphenofemoral Junction: No evidence of thrombus. Normal compressibility and flow on color Doppler imaging. Profunda Femoral Vein: No evidence of thrombus. Normal compressibility and flow on color Doppler imaging. Femoral Vein: No evidence of thrombus. Normal compressibility, respiratory phasicity and response to augmentation. Popliteal Vein: No evidence of thrombus. Normal compressibility, respiratory phasicity and response to augmentation. Calf Veins: No evidence of thrombus. Normal compressibility and flow on color Doppler imaging. Superficial Great  Saphenous Vein: No evidence of thrombus. Normal compressibility. Venous Reflux:  None. Other Findings:  LEFT calf edema is noted. IMPRESSION: No evidence of deep venous thrombosis within the LEFT lower extremity. Electronically Signed   By: Suzen Dials M.D.   On: 06/07/2024 10:11       Herlinda Milling, PA-C 06/07/24 1033    Suzette Pac, MD 06/08/24 [phone]

## 2024-06-10 ENCOUNTER — Other Ambulatory Visit: Payer: Self-pay | Admitting: Family Medicine

## 2024-08-11 ENCOUNTER — Other Ambulatory Visit: Payer: Self-pay | Admitting: Family Medicine

## 2024-08-25 ENCOUNTER — Ambulatory Visit

## 2024-08-25 ENCOUNTER — Telehealth: Payer: Self-pay | Admitting: Family Medicine

## 2024-08-25 MED ORDER — VERAPAMIL HCL ER 180 MG PO TBCR: 180.0000 mg | EXTENDED_RELEASE_TABLET | Freq: Every day | ORAL | 2 refills | Status: AC

## 2024-08-25 NOTE — Telephone Encounter (Signed)
 Diltiazem  no longer being covered, et her kn ow and I am sending in verapamil  180 mg in place  of the diltiazem  for 2 months, will need an appointment in next 2 months. Info in your box for reference Arrange for her to come for flu vaccine is she has not had it asap pls A lot of gaps in care

## 2024-08-27 ENCOUNTER — Telehealth: Payer: Self-pay | Admitting: Family Medicine

## 2024-08-27 ENCOUNTER — Ambulatory Visit: Payer: Self-pay

## 2024-08-27 NOTE — Telephone Encounter (Signed)
 Pt.informed. Appt scheduled.

## 2024-08-27 NOTE — Telephone Encounter (Signed)
 FYI Only or Action Required?: FYI only for provider: See note Below.  Patient was last seen in primary care on 12/17/2023 by Antonetta Rollene BRAVO, MD.  Called Nurse Triage reporting Information Only.   Triage Disposition: Information or Advice Only Call  Patient/caregiver understands and will follow disposition?: Yes            Copied from CRM [id number]. Topic: Clinical - Medication Question >> Aug 27, 2024 11:29 AM Tiffini S wrote: Reason for CRM: Patient is suppose to take the white tablet- asked can she take the rest of her medication or which prescription should she be taking potassium, water  pill in the morning   Please call the patient at [phone]   Reason for Disposition  Health information question, no triage required and triager able to answer question  Answer Assessment - Initial Assessment Questions 1. REASON FOR CALL: What is the main reason for your call? or How can I best help you?    Patient called into triage to ask when she should be taking the potasium, nortriptyline , and rosuvastatin  medications to make sure she was taking them as prescribed. This RN went over the medications, doses, and frequency. Patient has no further questions or concerns at this time.  Protocols used: Information Only Call - No Triage-A-AH

## 2024-08-27 NOTE — Telephone Encounter (Signed)
 See triage note.

## 2024-08-27 NOTE — Telephone Encounter (Unsigned)
 Copied from CRM [id number]. Topic: Clinical - Medication Question >> Aug 27, 2024 11:29 AM Tiffini S wrote: Reason for CRM: Patient is suppose to take the white tablet- asked can she take the rest of her medication or which prescription should she be taking potassium, water  pill in the morning  Please call the patient at [phone]

## 2024-09-08 ENCOUNTER — Other Ambulatory Visit: Payer: Self-pay | Admitting: Family Medicine

## 2024-09-25 ENCOUNTER — Telehealth: Payer: Self-pay

## 2024-09-25 NOTE — Telephone Encounter (Signed)
 Thanks

## 2024-09-25 NOTE — Telephone Encounter (Signed)
 Copied from CRM #[id number]. Topic: Clinical - Medication Question >> Sep 24, 2024  4:30 PM Delon DASEN wrote: Reason for CRM: Patient is asking which medication she is supposed to stop taking, [phone] needs to go over all meds.

## 2024-10-07 ENCOUNTER — Ambulatory Visit: Payer: Self-pay | Admitting: Family Medicine

## 2024-11-06 ENCOUNTER — Ambulatory Visit: Payer: Self-pay | Admitting: Family Medicine
